# Patient Record
Sex: Female | Born: 1950 | Race: Black or African American | Hispanic: No | Marital: Single | Smoking: Never smoker
Health system: Southern US, Community
[De-identification: ages and names within clinical notes are randomized; demographics above are authoritative.]

## PROBLEM LIST (undated history)

## (undated) DIAGNOSIS — M1990 Unspecified osteoarthritis, unspecified site: Secondary | ICD-10-CM

## (undated) DIAGNOSIS — D649 Anemia, unspecified: Secondary | ICD-10-CM

## (undated) DIAGNOSIS — Z973 Presence of spectacles and contact lenses: Secondary | ICD-10-CM

## (undated) DIAGNOSIS — M109 Gout, unspecified: Secondary | ICD-10-CM

## (undated) DIAGNOSIS — J189 Pneumonia, unspecified organism: Secondary | ICD-10-CM

## (undated) HISTORY — DX: Unspecified osteoarthritis, unspecified site: M19.90

## (undated) HISTORY — PX: NO PAST SURGERIES: SHX2092

## (undated) HISTORY — DX: Anemia, unspecified: D64.9

## (undated) HISTORY — PX: COLONOSCOPY W/ POLYPECTOMY: SHX1380

---

## 2012-10-15 ENCOUNTER — Encounter: Payer: Self-pay | Admitting: Nurse Practitioner

## 2012-10-15 ENCOUNTER — Ambulatory Visit (INDEPENDENT_AMBULATORY_CARE_PROVIDER_SITE_OTHER): Payer: BC Managed Care – PPO | Admitting: Nurse Practitioner

## 2012-10-15 ENCOUNTER — Telehealth: Payer: Self-pay | Admitting: Nurse Practitioner

## 2012-10-15 VITALS — BP 130/78 | HR 76 | Temp 97.7°F | Ht 64.0 in | Wt 155.0 lb

## 2012-10-15 DIAGNOSIS — M25511 Pain in right shoulder: Secondary | ICD-10-CM

## 2012-10-15 DIAGNOSIS — M25569 Pain in unspecified knee: Secondary | ICD-10-CM

## 2012-10-15 DIAGNOSIS — M25519 Pain in unspecified shoulder: Secondary | ICD-10-CM

## 2012-10-15 DIAGNOSIS — M25561 Pain in right knee: Secondary | ICD-10-CM

## 2012-10-15 DIAGNOSIS — M25512 Pain in left shoulder: Secondary | ICD-10-CM

## 2012-10-15 MED ORDER — MELOXICAM 15 MG PO TABS: 15.0000 mg | ORAL_TABLET | Freq: Every day | ORAL | Status: DC

## 2012-10-15 NOTE — Progress Notes (Signed)
  Subjective:    Patient ID: Caitlyn Newman, female    DOB: 09/23/1950, 62 y.o.   MRN: [id number]  HPI1. Patient in c/o right knee pain- has been hurting for awile- has seen orthopedist and said cartilage has gone bad and needs surgery - planning to have later on in the year. 2. SHe is also C/O shoulders being sore- gets better throughout the day. Has to raise arms up a lot at work.   Review of Systems  All other systems reviewed and are negative.       Objective:   Physical Exam  Constitutional: She appears well-developed and well-nourished.  Cardiovascular: Normal rate and normal heart sounds.   Pulmonary/Chest: Effort normal and breath sounds normal.  Musculoskeletal:  Decrease ROM bil SHpulder due to "soreness" with abduction. Grips equal bil motor strength and sensation distally intact. Mild right knee effusion with crepitus on flexion and extension- no patella tenderness.  Neurological: She has normal reflexes.      BP 130/78  Pulse 76  Temp(Src) 97.7 F (36.5 C) (Oral)  Ht 5\' 4"  (1.626 m)  Wt 155 lb (70.308 kg)  BMI 26.59 kg/m2     Assessment & Plan:   1. Right knee pain   2. Shoulder pain, bilateral    Meds ordered this encounter  Medications  . meloxicam (MOBIC) 15 MG tablet    Sig: Take 1 tablet (15 mg total) by mouth daily.    Dispense:  30 tablet    Refill:  0    Order Specific Question:  Supervising Provider    Answer:  Ernestina Penna [[id number]]   Moist heat to achy joints Rest Keep follow up appointment with orthopedist RTO Prn  Mary-Margaret Daphine Deutscher, FNP

## 2012-10-15 NOTE — Patient Instructions (Signed)

## 2012-10-15 NOTE — Telephone Encounter (Signed)
appt given  

## 2012-10-16 LAB — COMPLETE METABOLIC PANEL WITH GFR
ALT: 9 U/L (ref 0–35)
AST: 10 U/L (ref 0–37)
Albumin: 3.6 g/dL (ref 3.5–5.2)
BUN: 12 mg/dL (ref 6–23)
Calcium: 8.9 mg/dL (ref 8.4–10.5)
Chloride: 102 mEq/L (ref 96–112)
Potassium: 4.2 mEq/L (ref 3.5–5.3)
Total Protein: 6.8 g/dL (ref 6.0–8.3)

## 2012-10-16 LAB — ARTHRITIS PANEL: Sed Rate: 61 mm/hr — ABNORMAL HIGH (ref 0–22)

## 2012-10-20 ENCOUNTER — Telehealth: Payer: Self-pay | Admitting: Nurse Practitioner

## 2012-10-21 ENCOUNTER — Telehealth: Payer: Self-pay | Admitting: Nurse Practitioner

## 2012-10-21 NOTE — Telephone Encounter (Signed)
Patient aware of labs.  

## 2012-11-07 ENCOUNTER — Telehealth: Payer: Self-pay | Admitting: Nurse Practitioner

## 2012-11-07 NOTE — Telephone Encounter (Signed)
FYI. Do you want to make any changes?

## 2012-11-10 NOTE — Telephone Encounter (Signed)
Patient notified on 11/10/2012 by Bennie Pierini.

## 2012-11-10 NOTE — Telephone Encounter (Signed)
Ok lets see how do without- that is nit a normal side effect of muscle relaxer

## 2012-12-26 ENCOUNTER — Telehealth: Payer: Self-pay | Admitting: Nurse Practitioner

## 2012-12-29 ENCOUNTER — Other Ambulatory Visit: Payer: Self-pay | Admitting: Nurse Practitioner

## 2012-12-29 NOTE — Telephone Encounter (Signed)
done

## 2013-01-05 ENCOUNTER — Encounter (INDEPENDENT_AMBULATORY_CARE_PROVIDER_SITE_OTHER): Payer: Self-pay | Admitting: Surgery

## 2013-01-05 ENCOUNTER — Ambulatory Visit (INDEPENDENT_AMBULATORY_CARE_PROVIDER_SITE_OTHER): Payer: BC Managed Care – PPO | Admitting: Surgery

## 2013-01-05 VITALS — BP 130/80 | HR 88 | Temp 98.2°F | Resp 14 | Ht 64.0 in | Wt 143.4 lb

## 2013-01-05 DIAGNOSIS — R599 Enlarged lymph nodes, unspecified: Secondary | ICD-10-CM

## 2013-01-05 DIAGNOSIS — R590 Localized enlarged lymph nodes: Secondary | ICD-10-CM | POA: Insufficient documentation

## 2013-01-05 NOTE — Progress Notes (Signed)
Patient ID: Caitlyn Newman, female   DOB: 15-Oct-1950, 62 y.o.   MRN: [id number]  Chief Complaint  Patient presents with  . New Evaluation    eval intra abd lymphadenopathy    HPI Caitlyn Newman is a 62 y.o. female.  Patient's at request of [facility] nurse practitioner for inguinal lymphadenopathy, fatigue and weight loss with anemia. Abdominal and pelvic CT scan was obtained which showed retroperitoneal, external iliac and inguinal lymphadenopathy. Largest measures 1.3 cm in the right inguinal region. Patient complains of fatigue, anemia weight loss and knee pain. HPI  Past Medical History  Diagnosis Date  . Anemia   . Arthritis     History reviewed. No pertinent past surgical history.  Family History  Problem Relation Age of Onset  . Diabetes Mother   . Kidney disease Mother   . Hypertension Mother   . Hypertension Father   . Diabetes Father     Social History History  Substance Use Topics  . Smoking status: Never Smoker   . Smokeless tobacco: Never Used  . Alcohol Use: No    Allergies  Allergen Reactions  . Cortisone     Other reaction(s): Rash  . Penicillins     Other reaction(s): Other Unknown    Current Outpatient Prescriptions  Medication Sig Dispense Refill  . ibuprofen (ADVIL,MOTRIN) 200 MG tablet 200 mg. Take 200 mg by mouth every 6 (six) hours as needed for Pain.      . meloxicam (MOBIC) 15 MG tablet TAKE 1 TABLET (15 MG TOTAL) BY MOUTH DAILY.  30 tablet  0   No current facility-administered medications for this visit.    Review of Systems Review of Systems  Constitutional: Positive for fatigue and unexpected weight change.  HENT: Negative.   Eyes: Negative.   Respiratory: Negative.   Cardiovascular: Negative.   Gastrointestinal: Negative.   Endocrine: Negative.   Genitourinary: Negative.   Musculoskeletal: Positive for arthralgias.  Skin: Negative.   Allergic/Immunologic: Negative.   Neurological: Negative.   Hematological:  Negative.   Psychiatric/Behavioral: Negative.     Blood pressure 130/80, pulse 88, temperature 98.2 F (36.8 C), temperature source Temporal, resp. rate 14, height 5\' 4"  (1.626 m), weight 143 lb 6.4 oz (65.046 kg).  Physical Exam Physical Exam  Constitutional: She is oriented to person, place, and time. She appears well-developed and well-nourished.  HENT:  Head: Normocephalic and atraumatic.  Eyes: EOM are normal. Pupils are equal, round, and reactive to light.  Pulmonary/Chest: Effort normal and breath sounds normal.  Abdominal: Soft. Bowel sounds are normal. She exhibits no distension. There is no tenderness.  Musculoskeletal:       Right knee: She exhibits decreased range of motion. Tenderness found.  Lymphadenopathy:    She has no cervical adenopathy.    She has no axillary adenopathy.       Right: Inguinal adenopathy present.       Left: Inguinal adenopathy present.  Neurological: She is alert and oriented to person, place, and time.  Skin: Skin is warm and dry.  Psychiatric: She has a normal mood and affect. Her behavior is normal. Judgment and thought content normal.    Data Reviewed [facility] notes  Assessment    Inguinal lymphadenopathy bilateral with extension along external iliac vessels to retroperitoneum by CT scan assessment  Anemia unspecified  fatigue    Plan    Recommend right inguinal lymph node biopsy to exclude lymphoma.The procedure has been discussed with the patient.  Alternative therapies have been  discussed with the patient.  Operative risks include bleeding,  Infection,  Organ injury,  Nerve injury,  Blood vessel injury,  DVT,  Pulmonary embolism,  Death,  And possible reoperation.  Medical management risks include worsening of present situation.  The success of the procedure is 50 -90 % at treating patients symptoms.  The patient understands and agrees to proceed.       Hanan Moen A. 01/05/2013, 3:44 PM

## 2013-01-05 NOTE — Patient Instructions (Signed)
Lymphadenopathy °Lymphadenopathy means "disease of the lymph glands." But the term is usually used to describe swollen or enlarged lymph glands, also called lymph nodes. These are the bean-shaped organs found in many locations including the neck, underarm, and groin. Lymph glands are part of the immune system, which fights infections in your body. Lymphadenopathy can occur in just one area of the body, such as the neck, or it can be generalized, with lymph node enlargement in several areas. The nodes found in the neck are the most common sites of lymphadenopathy. °CAUSES  °When your immune system responds to germs (such as viruses or bacteria ), infection-fighting cells and fluid build up. This causes the glands to grow in size. This is usually not something to worry about. Sometimes, the glands themselves can become infected and inflamed. This is called lymphadenitis. °Enlarged lymph nodes can be caused by many diseases: °· Bacterial disease, such as strep throat or a skin infection. °· Viral disease, such as a common cold. °· Other germs, such as lyme disease, tuberculosis, or sexually transmitted diseases. °· Cancers, such as lymphoma (cancer of the lymphatic system) or leukemia (cancer of the white blood cells). °· Inflammatory diseases such as lupus or rheumatoid arthritis. °· Reactions to medications. °Many of the diseases above are rare, but important. This is why you should see your caregiver if you have lymphadenopathy. °SYMPTOMS  °· Swollen, enlarged lumps in the neck, back of the head or other locations. °· Tenderness. °· Warmth or redness of the skin over the lymph nodes. °· Fever. °DIAGNOSIS  °Enlarged lymph nodes are often near the source of infection. They can help healthcare providers diagnose your illness. For instance:  °· Swollen lymph nodes around the jaw might be caused by an infection in the mouth. °· Enlarged glands in the neck often signal a throat infection. °· Lymph nodes that are swollen  in more than one area often indicate an illness caused by a virus. °Your caregiver most likely will know what is causing your lymphadenopathy after listening to your history and examining you. Blood tests, x-rays or other tests may be needed. If the cause of the enlarged lymph node cannot be found, and it does not go away by itself, then a biopsy may be needed. Your caregiver will discuss this with you. °TREATMENT  °Treatment for your enlarged lymph nodes will depend on the cause. Many times the nodes will shrink to normal size by themselves, with no treatment. Antibiotics or other medicines may be needed for infection. Only take over-the-counter or prescription medicines for pain, discomfort or fever as directed by your caregiver. °HOME CARE INSTRUCTIONS  °Swollen lymph glands usually return to normal when the underlying medical condition goes away. If they persist, contact your health-care provider. He/she might prescribe antibiotics or other treatments, depending on the diagnosis. Take any medications exactly as prescribed. Keep any follow-up appointments made to check on the condition of your enlarged nodes.  °SEEK MEDICAL CARE IF:  °· Swelling lasts for more than two weeks. °· You have symptoms such as weight loss, night sweats, fatigue or fever that does not go away. °· The lymph nodes are hard, seem fixed to the skin or are growing rapidly. °· Skin over the lymph nodes is red and inflamed. This could mean there is an infection. °SEEK IMMEDIATE MEDICAL CARE IF:  °· Fluid starts leaking from the area of the enlarged lymph node. °· You develop a fever of 102° F (38.9° C) or greater. °· Severe   pain develops (not necessarily at the site of a large lymph node). °· You develop chest pain or shortness of breath. °· You develop worsening abdominal pain. °MAKE SURE YOU:  °· Understand these instructions. °· Will watch your condition. °· Will get help right away if you are not doing well or get worse. °Document  Released: 01/03/2008 Document Revised: 06/18/2011 Document Reviewed: 01/03/2008 °[employer]® Patient Information ©2014 [employer]. ° °

## 2013-01-06 ENCOUNTER — Encounter (INDEPENDENT_AMBULATORY_CARE_PROVIDER_SITE_OTHER): Payer: Self-pay

## 2013-01-07 ENCOUNTER — Encounter (HOSPITAL_BASED_OUTPATIENT_CLINIC_OR_DEPARTMENT_OTHER): Payer: Self-pay | Admitting: *Deleted

## 2013-01-07 NOTE — Progress Notes (Signed)
Pt go to [facility] for labs and [facility]-orders faxed both places and pt to call for appt

## 2013-01-08 ENCOUNTER — Encounter (HOSPITAL_COMMUNITY)
Admission: RE | Admit: 2013-01-08 | Discharge: 2013-01-08 | Disposition: A | Discharge status: Home / Self Care | Payer: BC Managed Care – PPO | Source: Ambulatory Visit | Attending: Surgery | Admitting: Surgery

## 2013-01-08 ENCOUNTER — Ambulatory Visit (HOSPITAL_COMMUNITY)
Admission: RE | Admit: 2013-01-08 | Discharge: 2013-01-08 | Disposition: A | Discharge status: Home / Self Care | Payer: BC Managed Care – PPO | Source: Ambulatory Visit | Attending: Surgery | Admitting: Surgery

## 2013-01-08 DIAGNOSIS — R599 Enlarged lymph nodes, unspecified: Secondary | ICD-10-CM | POA: Insufficient documentation

## 2013-01-08 DIAGNOSIS — R634 Abnormal weight loss: Secondary | ICD-10-CM | POA: Insufficient documentation

## 2013-01-08 LAB — COMPREHENSIVE METABOLIC PANEL
ALT: 14 U/L (ref 0–35)
AST: 13 U/L (ref 0–37)
CO2: 29 mEq/L (ref 19–32)
Chloride: 98 mEq/L (ref 96–112)
Creatinine, Ser: 0.56 mg/dL (ref 0.50–1.10)
GFR calc Af Amer: >90 mL/min (ref >90)
GFR calc non Af Amer: >90 mL/min (ref >90)
Glucose, Bld: 191 mg/dL — ABNORMAL HIGH (ref 70–99)
Potassium: 3.9 mEq/L (ref 3.5–5.1)
Total Bilirubin: 0.3 mg/dL (ref 0.3–1.2)

## 2013-01-08 LAB — CBC WITH DIFFERENTIAL/PLATELET
Basophils Absolute: 0 10*3/uL (ref 0.0–0.1)
HCT: 33.4 % — ABNORMAL LOW (ref 36.0–46.0)
Hemoglobin: 10.8 g/dL — ABNORMAL LOW (ref 12.0–15.0)
Lymphocytes Relative: 14 % (ref 12–46)
Lymphs Abs: 1.6 10*3/uL (ref 0.7–4.0)
Monocytes Absolute: 0.6 10*3/uL (ref 0.1–1.0)
Monocytes Relative: 5 % (ref 3–12)
Neutro Abs: 9.4 10*3/uL — ABNORMAL HIGH (ref 1.7–7.7)
Neutrophils Relative %: 81 % — ABNORMAL HIGH (ref 43–77)
RBC: 3.96 MIL/uL (ref 3.87–5.11)
RDW: 13.7 % (ref 11.5–15.5)
WBC: 11.6 10*3/uL — ABNORMAL HIGH (ref 4.0–10.5)

## 2013-01-13 ENCOUNTER — Ambulatory Visit (HOSPITAL_BASED_OUTPATIENT_CLINIC_OR_DEPARTMENT_OTHER): Payer: BC Managed Care – PPO | Admitting: Certified Registered Nurse Anesthetist

## 2013-01-13 ENCOUNTER — Ambulatory Visit (HOSPITAL_BASED_OUTPATIENT_CLINIC_OR_DEPARTMENT_OTHER)
Admission: RE | Admit: 2013-01-13 | Discharge: 2013-01-13 | Disposition: A | Discharge status: Home / Self Care | Payer: BC Managed Care – PPO | Source: Ambulatory Visit | Attending: Surgery | Admitting: Surgery

## 2013-01-13 ENCOUNTER — Encounter (HOSPITAL_BASED_OUTPATIENT_CLINIC_OR_DEPARTMENT_OTHER)
Admission: RE | Disposition: A | Discharge status: Home / Self Care | Payer: Self-pay | Source: Ambulatory Visit | Attending: Surgery

## 2013-01-13 ENCOUNTER — Encounter (HOSPITAL_BASED_OUTPATIENT_CLINIC_OR_DEPARTMENT_OTHER): Payer: Self-pay | Admitting: Certified Registered Nurse Anesthetist

## 2013-01-13 DIAGNOSIS — M25569 Pain in unspecified knee: Secondary | ICD-10-CM | POA: Insufficient documentation

## 2013-01-13 DIAGNOSIS — R5381 Other malaise: Secondary | ICD-10-CM | POA: Insufficient documentation

## 2013-01-13 DIAGNOSIS — M129 Arthropathy, unspecified: Secondary | ICD-10-CM | POA: Insufficient documentation

## 2013-01-13 DIAGNOSIS — R634 Abnormal weight loss: Secondary | ICD-10-CM | POA: Insufficient documentation

## 2013-01-13 DIAGNOSIS — R599 Enlarged lymph nodes, unspecified: Secondary | ICD-10-CM

## 2013-01-13 DIAGNOSIS — R590 Localized enlarged lymph nodes: Secondary | ICD-10-CM

## 2013-01-13 DIAGNOSIS — Z79899 Other long term (current) drug therapy: Secondary | ICD-10-CM | POA: Insufficient documentation

## 2013-01-13 DIAGNOSIS — D649 Anemia, unspecified: Secondary | ICD-10-CM | POA: Insufficient documentation

## 2013-01-13 HISTORY — DX: Presence of spectacles and contact lenses: Z97.3

## 2013-01-13 HISTORY — PX: LYMPH NODE BIOPSY: SHX201

## 2013-01-13 SURGERY — LYMPH NODE BIOPSY
Anesthesia: General | Site: Groin | Laterality: Right | Wound class: Clean

## 2013-01-13 MED ORDER — CHLORHEXIDINE GLUCONATE 4 % EX LIQD: 1.0000 "application " | Freq: Once | CUTANEOUS | Status: DC

## 2013-01-13 MED ORDER — OXYCODONE HCL 5 MG PO TABS: 5.0000 mg | ORAL_TABLET | Freq: Once | ORAL | Status: DC | PRN

## 2013-01-13 MED ORDER — LIDOCAINE HCL (CARDIAC) 20 MG/ML IV SOLN
INTRAVENOUS | Status: DC | PRN
  Administered 2013-01-13: 60 mg via INTRAVENOUS

## 2013-01-13 MED ORDER — OXYCODONE HCL 5 MG/5ML PO SOLN: 5.0000 mg | Freq: Once | ORAL | Status: DC | PRN

## 2013-01-13 MED ORDER — MIDAZOLAM HCL 2 MG/2ML IJ SOLN: 1.0000 mg | INTRAMUSCULAR | Status: DC | PRN

## 2013-01-13 MED ORDER — MIDAZOLAM HCL 5 MG/5ML IJ SOLN
INTRAMUSCULAR | Status: DC | PRN
  Administered 2013-01-13: 1 mg via INTRAVENOUS

## 2013-01-13 MED ORDER — BUPIVACAINE-EPINEPHRINE 0.25% -1:200000 IJ SOLN
INTRAMUSCULAR | Status: DC | PRN
  Administered 2013-01-13: 10 mL

## 2013-01-13 MED ORDER — FENTANYL CITRATE 0.05 MG/ML IJ SOLN: 50.0000 ug | INTRAMUSCULAR | Status: DC | PRN

## 2013-01-13 MED ORDER — METOCLOPRAMIDE HCL 5 MG/ML IJ SOLN: 10.0000 mg | Freq: Once | INTRAMUSCULAR | Status: DC | PRN

## 2013-01-13 MED ORDER — HYDROCODONE-ACETAMINOPHEN 5-325 MG PO TABS: 1.0000 | ORAL_TABLET | Freq: Four times a day (QID) | ORAL | Status: DC | PRN

## 2013-01-13 MED ORDER — HYDROMORPHONE HCL PF 1 MG/ML IJ SOLN: 0.2500 mg | INTRAMUSCULAR | Status: DC | PRN

## 2013-01-13 MED ORDER — METOCLOPRAMIDE HCL 5 MG/ML IJ SOLN
INTRAMUSCULAR | Status: DC | PRN
  Administered 2013-01-13: 10 mg via INTRAVENOUS

## 2013-01-13 MED ORDER — PROPOFOL 10 MG/ML IV BOLUS
INTRAVENOUS | Status: DC | PRN
  Administered 2013-01-13: 150 mg via INTRAVENOUS

## 2013-01-13 MED ORDER — FENTANYL CITRATE 0.05 MG/ML IJ SOLN
INTRAMUSCULAR | Status: DC | PRN
  Administered 2013-01-13 (×3): 25 ug via INTRAVENOUS
  Administered 2013-01-13: 50 ug via INTRAVENOUS

## 2013-01-13 MED ORDER — VANCOMYCIN HCL IN DEXTROSE 1-5 GM/200ML-% IV SOLN
1000.0000 mg | INTRAVENOUS | Status: AC
  Administered 2013-01-13 (×2): 1000 mg via INTRAVENOUS

## 2013-01-13 MED ORDER — LACTATED RINGERS IV SOLN
INTRAVENOUS | Status: DC
  Administered 2013-01-13: 07:00:00 via INTRAVENOUS

## 2013-01-13 SURGICAL SUPPLY — 39 items
BENZOIN TINCTURE PRP APPL 2/3 (GAUZE/BANDAGES/DRESSINGS) ×2 IMPLANT
BLADE SURG 15 STRL LF DISP TIS (BLADE) ×1 IMPLANT
BLADE SURG 15 STRL SS (BLADE) ×1
CANISTER SUCTION 1200CC (MISCELLANEOUS) IMPLANT
CHLORAPREP W/TINT 26ML (MISCELLANEOUS) ×2 IMPLANT
CLIP TI WIDE RED SMALL 6 (CLIP) IMPLANT
CLOTH BEACON ORANGE TIMEOUT ST (SAFETY) ×2 IMPLANT
COVER MAYO STAND STRL (DRAPES) ×2 IMPLANT
COVER TABLE BACK 60X90 (DRAPES) ×2 IMPLANT
DECANTER SPIKE VIAL GLASS SM (MISCELLANEOUS) ×2 IMPLANT
DRAPE PED LAPAROTOMY (DRAPES) ×2 IMPLANT
DRAPE UTILITY XL STRL (DRAPES) ×2 IMPLANT
ELECT COATED BLADE 2.86 ST (ELECTRODE) ×2 IMPLANT
ELECT REM PT RETURN 9FT ADLT (ELECTROSURGICAL) ×2
ELECTRODE REM PT RTRN 9FT ADLT (ELECTROSURGICAL) ×1 IMPLANT
GLOVE BIOGEL PI IND STRL 7.0 (GLOVE) ×2 IMPLANT
GLOVE BIOGEL PI IND STRL 8 (GLOVE) ×1 IMPLANT
GLOVE BIOGEL PI INDICATOR 7.0 (GLOVE) ×2
GLOVE BIOGEL PI INDICATOR 8 (GLOVE) ×1
GLOVE ECLIPSE 6.5 STRL STRAW (GLOVE) ×2 IMPLANT
GLOVE ECLIPSE 8.0 STRL XLNG CF (GLOVE) ×2 IMPLANT
GOWN PREVENTION PLUS XLARGE (GOWN DISPOSABLE) ×2 IMPLANT
NEEDLE HYPO 25X1 1.5 SAFETY (NEEDLE) ×2 IMPLANT
NS IRRIG 1000ML POUR BTL (IV SOLUTION) ×2 IMPLANT
PACK BASIN DAY SURGERY FS (CUSTOM PROCEDURE TRAY) ×2 IMPLANT
PENCIL BUTTON HOLSTER BLD 10FT (ELECTRODE) ×2 IMPLANT
SLEEVE SCD COMPRESS KNEE MED (MISCELLANEOUS) IMPLANT
SPONGE LAP 4X18 X RAY DECT (DISPOSABLE) ×2 IMPLANT
STAPLER VISISTAT 35W (STAPLE) IMPLANT
STRIP CLOSURE SKIN 1/2X4 (GAUZE/BANDAGES/DRESSINGS) ×2 IMPLANT
SUT CHROMIC 3 0 SH 27 (SUTURE) IMPLANT
SUT MON AB 4-0 PC3 18 (SUTURE) ×2 IMPLANT
SUT VIC AB 3-0 SH 27 (SUTURE)
SUT VIC AB 3-0 SH 27X BRD (SUTURE) IMPLANT
SYR CONTROL 10ML LL (SYRINGE) ×2 IMPLANT
TOWEL OR 17X24 6PK STRL BLUE (TOWEL DISPOSABLE) ×4 IMPLANT
TOWEL OR NON WOVEN STRL DISP B (DISPOSABLE) ×2 IMPLANT
TUBE CONNECTING 20X1/4 (TUBING) ×2 IMPLANT
YANKAUER SUCT BULB TIP NO VENT (SUCTIONS) IMPLANT

## 2013-01-13 NOTE — Interval H&P Note (Signed)
History and Physical Interval Note:  01/13/2013 6:51 AM  Caitlyn Newman  has presented today for surgery, with the diagnosis of RIGHT GROIN PAIN  The various methods of treatment have been discussed with the patient and family. After consideration of risks, benefits and other options for treatment, the patient has consented to  Procedure(s): LYMPH NODE BIOPSY RIGHT GROIN (Right) as a surgical intervention .  The patient's history has been reviewed, patient examined, no change in status, stable for surgery.  I have reviewed the patient's chart and labs.  Questions were answered to the patient's satisfaction.     Amyra Vantuyl A.

## 2013-01-13 NOTE — Anesthesia Procedure Notes (Signed)
Procedure Name: LMA Insertion Date/Time: 01/13/2013 7:32 AM Performed by: Rudi Knippenberg D Pre-anesthesia Checklist: Patient identified, Emergency Drugs available, Suction available and Patient being monitored Patient Re-evaluated:Patient Re-evaluated prior to inductionOxygen Delivery Method: Circle System Utilized Preoxygenation: Pre-oxygenation with 100% oxygen Intubation Type: IV induction Ventilation: Mask ventilation without difficulty LMA: LMA inserted LMA Size: 4.0 Number of attempts: 1 Airway Equipment and Method: bite block Placement Confirmation: positive ETCO2 Tube secured with: Tape Dental Injury: Teeth and Oropharynx as per pre-operative assessment

## 2013-01-13 NOTE — Op Note (Signed)
Preoperative diagnosis: Right inguinal lymphadenopathy  Postoperative diagnosis: Same  Procedure: Right inguinal lymph node biopsy superficial  Surgeon: Harriette Bouillon M.D.  Anesthesia: Gen. With 0.25% Sensorcaine local with epinephrine  EBL: 20 cc  Specimen: Right inguinal lymph nodes to pathology  IV fluids: 300 cc crystalloid  Drains: None  Indications for procedure: The patient presents for right inguinal lymph node biopsy due to  to diffuse lymphadenopathy involving the inguinal, internal iliac and retroperitoneal lymphatic system. She is at weight loss and unexplained fevers for 2 months. Discussed the procedure at length with her. Risks, benefits and alternatives discussed. Complications discussed with the patient and are outlined in history and physical. She agreed to proceed.  Description of procedure: The patient was met in the holding area in the right groin was marked at the appropriate side. Questions are answered. She's taken back to the operating room placed upon the table. General anesthesia was initiated. Both groins were prepped and draped in a sterile fashion. Timeout was done and she received appropriate preoperative antibiotics. 2 cm incision was made in the right inguinal crease. Dissection was carried down to Scarpa's fascia to the aponeurosis of the external oblique. A 2 cm hard lymph node was identified and excised. Hemostasis achieved. Wound is closed with a deep layer of 3-0 Vicryl and 4-0 Monocryl was used to close the skin a subcuticular fashion. Dermabond applied. All final counts are found to be correct. Patient awoke, extubated and taken to recovery in satisfactory condition.

## 2013-01-13 NOTE — H&P (View-Only) (Signed)
Patient ID: Caitlyn Newman, female   DOB: 1950/06/21, 62 y.o.   MRN: [id number]  Chief Complaint  Patient presents with  . New Evaluation    eval intra abd lymphadenopathy    HPI Caitlyn Newman is a 62 y.o. female.  Patient's at request of [facility] nurse practitioner for inguinal lymphadenopathy, fatigue and weight loss with anemia. Abdominal and pelvic CT scan was obtained which showed retroperitoneal, external iliac and inguinal lymphadenopathy. Largest measures 1.3 cm in the right inguinal region. Patient complains of fatigue, anemia weight loss and knee pain. HPI  Past Medical History  Diagnosis Date  . Anemia   . Arthritis     History reviewed. No pertinent past surgical history.  Family History  Problem Relation Age of Onset  . Diabetes Mother   . Kidney disease Mother   . Hypertension Mother   . Hypertension Father   . Diabetes Father     Social History History  Substance Use Topics  . Smoking status: Never Smoker   . Smokeless tobacco: Never Used  . Alcohol Use: No    Allergies  Allergen Reactions  . Cortisone     Other reaction(s): Rash  . Penicillins     Other reaction(s): Other Unknown    Current Outpatient Prescriptions  Medication Sig Dispense Refill  . ibuprofen (ADVIL,MOTRIN) 200 MG tablet 200 mg. Take 200 mg by mouth every 6 (six) hours as needed for Pain.      . meloxicam (MOBIC) 15 MG tablet TAKE 1 TABLET (15 MG TOTAL) BY MOUTH DAILY.  30 tablet  0   No current facility-administered medications for this visit.    Review of Systems Review of Systems  Constitutional: Positive for fatigue and unexpected weight change.  HENT: Negative.   Eyes: Negative.   Respiratory: Negative.   Cardiovascular: Negative.   Gastrointestinal: Negative.   Endocrine: Negative.   Genitourinary: Negative.   Musculoskeletal: Positive for arthralgias.  Skin: Negative.   Allergic/Immunologic: Negative.   Neurological: Negative.   Hematological:  Negative.   Psychiatric/Behavioral: Negative.     Blood pressure 130/80, pulse 88, temperature 98.2 F (36.8 C), temperature source Temporal, resp. rate 14, height 5\' 4"  (1.626 m), weight 143 lb 6.4 oz (65.046 kg).  Physical Exam Physical Exam  Constitutional: She is oriented to person, place, and time. She appears well-developed and well-nourished.  HENT:  Head: Normocephalic and atraumatic.  Eyes: EOM are normal. Pupils are equal, round, and reactive to light.  Pulmonary/Chest: Effort normal and breath sounds normal.  Abdominal: Soft. Bowel sounds are normal. She exhibits no distension. There is no tenderness.  Musculoskeletal:       Right knee: She exhibits decreased range of motion. Tenderness found.  Lymphadenopathy:    She has no cervical adenopathy.    She has no axillary adenopathy.       Right: Inguinal adenopathy present.       Left: Inguinal adenopathy present.  Neurological: She is alert and oriented to person, place, and time.  Skin: Skin is warm and dry.  Psychiatric: She has a normal mood and affect. Her behavior is normal. Judgment and thought content normal.    Data Reviewed [facility] notes  Assessment    Inguinal lymphadenopathy bilateral with extension along external iliac vessels to retroperitoneum by CT scan assessment  Anemia unspecified  fatigue    Plan    Recommend right inguinal lymph node biopsy to exclude lymphoma.The procedure has been discussed with the patient.  Alternative therapies have been  discussed with the patient.  Operative risks include bleeding,  Infection,  Organ injury,  Nerve injury,  Blood vessel injury,  DVT,  Pulmonary embolism,  Death,  And possible reoperation.  Medical management risks include worsening of present situation.  The success of the procedure is 50 -90 % at treating patients symptoms.  The patient understands and agrees to proceed.       Tynslee Bowlds A. 01/05/2013, 3:44 PM

## 2013-01-13 NOTE — Anesthesia Preprocedure Evaluation (Addendum)
Anesthesia Evaluation  Patient identified by MRN, date of birth, ID band Patient awake    Reviewed: Allergy & Precautions, H&P , NPO status , Patient's Chart, lab work & pertinent test results, reviewed documented beta blocker date and time   Airway Mallampati: II TM Distance: >3 FB Neck ROM: full    Dental   Pulmonary neg pulmonary ROS,  breath sounds clear to auscultation        Cardiovascular negative cardio ROS  Rhythm:regular     Neuro/Psych negative neurological ROS  negative psych ROS   GI/Hepatic negative GI ROS, Neg liver ROS,   Endo/Other  negative endocrine ROS  Renal/GU negative Renal ROS  negative genitourinary   Musculoskeletal   Abdominal   Peds  Hematology negative hematology ROS (+) Blood dyscrasia, anemia ,   Anesthesia Other Findings See surgeon's H&P   Reproductive/Obstetrics negative OB ROS                           Anesthesia Physical Anesthesia Plan  ASA: II  Anesthesia Plan: General   Post-op Pain Management:    Induction: Intravenous  Airway Management Planned: LMA  Additional Equipment:   Intra-op Plan:   Post-operative Plan:   Informed Consent: I have reviewed the patients History and Physical, chart, labs and discussed the procedure including the risks, benefits and alternatives for the proposed anesthesia with the patient or authorized representative who has indicated his/her understanding and acceptance.   Dental Advisory Given  Plan Discussed with: CRNA and Surgeon  Anesthesia Plan Comments:         Anesthesia Quick Evaluation

## 2013-01-13 NOTE — Anesthesia Postprocedure Evaluation (Signed)
Anesthesia Post Note  Patient: Caitlyn Newman  Procedure(s) Performed: Procedure(s) (LRB): LYMPH NODE BIOPSY RIGHT GROIN (Right)  Anesthesia type: General  Patient location: PACU  Post pain: Pain level controlled  Post assessment: Patient's Cardiovascular Status Stable  Last Vitals:  Filed Vitals:   01/13/13 0900  BP: 128/66  Pulse: 81  Temp:   Resp: 20    Post vital signs: Reviewed and stable  Level of consciousness: alert  Complications: No apparent anesthesia complications

## 2013-01-13 NOTE — Transfer of Care (Signed)
Immediate Anesthesia Transfer of Care Note  Patient: Caitlyn Newman  Procedure(s) Performed: Procedure(s): LYMPH NODE BIOPSY RIGHT GROIN (Right)  Patient Location: PACU  Anesthesia Type:General  Level of Consciousness: awake and patient cooperative  Airway & Oxygen Therapy: Patient Spontanous Breathing and Patient connected to face mask oxygen  Post-op Assessment: Report given to PACU RN and Post -op Vital signs reviewed and stable  Post vital signs: Reviewed and stable  Complications: No apparent anesthesia complications

## 2013-01-14 ENCOUNTER — Telehealth (INDEPENDENT_AMBULATORY_CARE_PROVIDER_SITE_OTHER): Payer: Self-pay

## 2013-01-14 ENCOUNTER — Encounter (HOSPITAL_BASED_OUTPATIENT_CLINIC_OR_DEPARTMENT_OTHER): Payer: Self-pay | Admitting: Surgery

## 2013-01-14 NOTE — Telephone Encounter (Signed)
Pt called to let Dr. Luisa Hart know that her incision for yesterday's Bx has stopped bleeding.  Told pt I would send message to Dr. Luisa Hart

## 2013-01-16 ENCOUNTER — Telehealth (INDEPENDENT_AMBULATORY_CARE_PROVIDER_SITE_OTHER): Payer: Self-pay

## 2013-01-16 NOTE — Telephone Encounter (Signed)
The pt called in and asked if she can go out and about today.  She will not be the one driving.  I told her she can.

## 2013-01-20 ENCOUNTER — Telehealth (INDEPENDENT_AMBULATORY_CARE_PROVIDER_SITE_OTHER): Payer: Self-pay | Admitting: *Deleted

## 2013-01-20 NOTE — Telephone Encounter (Signed)
Patient called asking about her pathology results.  Patient also wanted to update Dr. Luisa Hart that she is improving and is starting to be able to do more for herself as far as dressing and getting around.  Explained that I would send a message to update him.

## 2013-01-20 NOTE — Telephone Encounter (Signed)
Called pt with below msg.  

## 2013-01-20 NOTE — Telephone Encounter (Signed)
No cancer benign.  Does not explain symptoms.  Will need to follow up with primary doctor.

## 2013-01-26 ENCOUNTER — Ambulatory Visit (INDEPENDENT_AMBULATORY_CARE_PROVIDER_SITE_OTHER): Payer: BC Managed Care – PPO | Admitting: Surgery

## 2013-01-26 ENCOUNTER — Encounter (INDEPENDENT_AMBULATORY_CARE_PROVIDER_SITE_OTHER): Payer: Self-pay | Admitting: Surgery

## 2013-01-26 VITALS — BP 126/74 | HR 84 | Temp 97.7°F | Resp 14 | Ht 64.0 in | Wt 141.6 lb

## 2013-01-26 DIAGNOSIS — Z9889 Other specified postprocedural states: Secondary | ICD-10-CM

## 2013-01-26 NOTE — Progress Notes (Signed)
Patient returns after right inguinal lymph node biopsy. This showed no signs of lymphoma or other etiology for her fatigue.  Exam: Right groin incision with some residual swelling. No signs of infection.  Impression: Status post right inguinal lymph node biopsy for fatigue and lymphadenopathy with benign findings  Plan: Contact primary care doctor for followup. Return as needed. Resume regular activity.  Lymph node for lymphoma, Right inguinal - BENIGN LYMPHOID TISSUE, SEE COMMENT.

## 2013-01-26 NOTE — Patient Instructions (Signed)
Call primary doctor as soon as possible to follow up.  Return as needed.  Resume regular activity.

## 2013-01-29 ENCOUNTER — Telehealth (INDEPENDENT_AMBULATORY_CARE_PROVIDER_SITE_OTHER): Payer: Self-pay

## 2013-01-29 NOTE — Telephone Encounter (Signed)
Pt calling in b/c she stated that she can not return back to work at the beginning of November. The pt states that her right hand is swollen and she can't work without her hand. I read pt's last office note of Dr Cornett's and it doesn't say anything about her hand being swollen. I did tell pt that she probably needs to call her primary care physician about her hand if it's swelling b/c Dr Luisa Hart just did a lymph node bx in the groin not near her hand. The pt still stated that she needs a note to be out of work. I explained to her that she would not get a note from [place] about not returning to work that she would need to see her primary care doctor to discuss being out of work longer than what we have given her for the lymph node bx. The pt will call her primary care physician now.

## 2013-01-30 NOTE — Telephone Encounter (Signed)
PT PHONED TODAY/STATED HER PCP RELAYED TO HER THAT DR CORNETT WOULD NEED TO EXTEND HER RTW DATE.  DUE TO PREV 01-29-13 NOTES FOR HAND SWELLING, UNABLE TO GIVE EXTENDED DATE DUE TO NOT BEING RELATED TO PREV GROIN SX. PT WILL CALL PCP FOR HAND PROBLEM.  GM

## 2013-01-30 NOTE — Telephone Encounter (Signed)
Ok to extend

## 2013-02-11 ENCOUNTER — Other Ambulatory Visit: Payer: Self-pay | Admitting: Nurse Practitioner

## 2013-02-12 ENCOUNTER — Other Ambulatory Visit: Payer: Self-pay | Admitting: Nurse Practitioner

## 2013-02-24 ENCOUNTER — Encounter (INDEPENDENT_AMBULATORY_CARE_PROVIDER_SITE_OTHER): Payer: Self-pay

## 2013-06-04 ENCOUNTER — Other Ambulatory Visit: Payer: Self-pay | Admitting: Nurse Practitioner

## 2013-06-05 NOTE — Telephone Encounter (Signed)
No  More refills without being seen

## 2013-06-05 NOTE — Telephone Encounter (Signed)
Notified at last refill that NTBS before next refill. Please advise

## 2013-08-03 ENCOUNTER — Inpatient Hospital Stay: Admit: 2013-08-03 | Payer: BC Managed Care – PPO | Admitting: Orthopedic Surgery

## 2013-08-03 SURGERY — ARTHROPLASTY, KNEE, TOTAL
Anesthesia: Spinal | Site: Knee | Laterality: Right

## 2013-10-28 ENCOUNTER — Ambulatory Visit (INDEPENDENT_AMBULATORY_CARE_PROVIDER_SITE_OTHER): Payer: 59

## 2013-10-28 ENCOUNTER — Encounter: Payer: Self-pay | Admitting: Nurse Practitioner

## 2013-10-28 ENCOUNTER — Ambulatory Visit (INDEPENDENT_AMBULATORY_CARE_PROVIDER_SITE_OTHER): Payer: 59 | Admitting: Nurse Practitioner

## 2013-10-28 VITALS — BP 124/56 | HR 64 | Temp 97.5°F | Ht 64.0 in | Wt 153.0 lb

## 2013-10-28 DIAGNOSIS — M129 Arthropathy, unspecified: Secondary | ICD-10-CM

## 2013-10-28 DIAGNOSIS — M1990 Unspecified osteoarthritis, unspecified site: Secondary | ICD-10-CM

## 2013-10-28 DIAGNOSIS — Z01818 Encounter for other preprocedural examination: Secondary | ICD-10-CM

## 2013-10-28 NOTE — Patient Instructions (Signed)
Tinnitus °Sounds you hear in your ears and coming from within the ear is called tinnitus. This can be a symptom of many ear disorders. It is often associated with hearing loss.  °Tinnitus can be seen with: °· Infections. °· Ear blockages such as wax buildup. °· Meniere's disease. °· Ear damage. °· Inherited. °· Occupational causes. °While irritating, it is not usually a threat to health. When the cause of the tinnitus is wax, infection in the middle ear, or foreign body it is easily treated. Hearing loss will usually be reversible.  °TREATMENT  °When treating the underlying cause does not get rid of tinnitus, it may be necessary to get rid of the unwanted sound by covering it up with more pleasant background noises. This may include music, the radio etc. There are tinnitus maskers which can be worn which produce background noise to cover up the tinnitus. °Avoid all medications which tend to make tinnitus worse such as alcohol, caffeine, aspirin, and nicotine. There are many soothing background tapes such as rain, ocean, thunderstorms, etc. These soothing sounds help with sleeping or resting. °Keep all follow-up appointments and referrals. This is important to identify the cause of the problem. It also helps avoid complications, impaired hearing, disability, or chronic pain. °Document Released: 03/26/2005 Document Revised: 06/18/2011 Document Reviewed: 11/12/2007 °ExitCare® Patient Information ©2015 [employer]. This information is not intended to replace advice given to you by your health care provider. Make sure you discuss any questions you have with your health care provider. ° °

## 2013-10-28 NOTE — Progress Notes (Signed)
   Subjective:    Patient ID: Caitlyn Newman, female    DOB: 1951-02-20, 63 y.o.   MRN: [id number]  HPI Patient in c/o ringing in her left ear- started last week- she says that the ringing is constant.    Review of Systems  Constitutional: Negative for fever and appetite change.  HENT: Negative.   Respiratory: Negative.   Cardiovascular: Negative.   Gastrointestinal: Negative.   Genitourinary: Negative.   Neurological: Negative.   Psychiatric/Behavioral: Negative.   All other systems reviewed and are negative.      Objective:   Physical Exam  Constitutional: She is oriented to person, place, and time. She appears well-developed and well-nourished.  HENT:  Bilateral cerumen impaction   Cardiovascular: Normal rate and normal heart sounds.   Pulmonary/Chest: Effort normal and breath sounds normal.  Abdominal: Soft. Bowel sounds are normal.  Neurological: She is alert and oriented to person, place, and time.  Skin: Skin is warm and dry.  Psychiatric: She has a normal mood and affect. Her behavior is normal. Judgment and thought content normal.   BP 124/56  Pulse 64  Temp(Src) 97.5 F (36.4 C) (Oral)  Ht 5\' 4"  (1.626 m)  Wt 153 lb (69.4 kg)  BMI 26.25 kg/m2  Chest x ray within normal limits-Preliminary reading by , FNP  [facility] EKG- sinus bradycardia-[facility], FNP       Assessment & Plan:  1. Preoperative clearance Surgical clearance granted pending anemia panel - EKG 12-Lead - DG Chest 2 View; Future - Anemia Profile B  2. Arthritis - Ambulatory referral to Rheumatology  3. Cerumen Impaction - debrox OTC 2-3 X a week - Do not stick anything in ear canals   Mary-Margaret Daphine Deutscher, FNP

## 2013-10-29 ENCOUNTER — Telehealth: Payer: Self-pay | Admitting: Nurse Practitioner

## 2013-11-11 ENCOUNTER — Encounter (HOSPITAL_COMMUNITY): Payer: Self-pay | Admitting: Pharmacy Technician

## 2013-11-11 NOTE — H&P (Signed)
TOTAL KNEE ADMISSION H&P  Patient is being admitted for right total knee arthroplasty.  Subjective:  Chief Complaint:   Right knee RA / pain  HPI: Caitlyn Newman, 63 y.o. female, has a history of pain and functional disability in the right knee due to arthritis and has failed non-surgical conservative treatments for greater than 12 weeks to include NSAID's and/or analgesics, corticosteriod injections, use of assistive devices and activity modification.  Onset of symptoms was gradual, starting 2+ years ago with gradually worsening course since that time. The patient noted no past surgery on the right knee(s).  Patient currently rates pain in the right knee(s) at 8 out of 10 with activity. Patient has night pain, worsening of pain with activity and weight bearing, pain that interferes with activities of daily living, pain with passive range of motion, crepitus and joint swelling.  Patient has evidence of periarticular osteophytes and joint space narrowing by imaging studies.  There is no active infection.  Risks, benefits and expectations were discussed with the patient.  Risks including but not limited to the risk of anesthesia, blood clots, nerve damage, blood vessel damage, failure of the prosthesis, infection and up to and including death.  Patient understand the risks, benefits and expectations and wishes to proceed with surgery.   PCP: Rudi Heap, MD  D/C Plans:      Home with HHPT  Post-op Meds:       No Rx given  Tranexamic Acid:      To be given - IV    Decadron:      Is to be given  FYI:     ASA post-op  Norco post-op    Patient Active Problem List   Diagnosis Date Noted  . Post-operative state 01/26/2013  . Lymphadenopathy, inguinal 01/05/2013   Past Medical History  Diagnosis Date  . Anemia   . Arthritis   . Wears glasses     Past Surgical History  Procedure Laterality Date  . No past surgeries    . Lymph node biopsy Right 01/13/2013    Procedure: LYMPH NODE  BIOPSY RIGHT GROIN;  Surgeon: Clovis Pu. Cornett, MD;  Location: [facility];  Service: General;  Laterality: Right;    No prescriptions prior to admission   Allergies  Allergen Reactions  . Cortisone     Other reaction(s): Rash  . Penicillins     Other reaction(s): Other Unknown    History  Substance Use Topics  . Smoking status: Never Smoker   . Smokeless tobacco: Never Used  . Alcohol Use: No    Family History  Problem Relation Age of Onset  . Diabetes Mother   . Kidney disease Mother   . Hypertension Mother   . Hypertension Father   . Diabetes Father      Review of Systems  Constitutional: Negative.   HENT: Negative.   Eyes: Negative.   Respiratory: Negative.   Cardiovascular: Negative.   Gastrointestinal: Negative.   Genitourinary: Negative.   Musculoskeletal: Positive for joint pain and myalgias.  Skin: Negative.   Neurological: Negative.   Endo/Heme/Allergies: Negative.   Psychiatric/Behavioral: Negative.     Objective:  Physical Exam  Constitutional: She is oriented to person, place, and time. She appears well-developed and well-nourished.  HENT:  Head: Normocephalic and atraumatic.  Mouth/Throat: Oropharynx is clear and moist.  Eyes: Pupils are equal, round, and reactive to light.  Neck: Neck supple. No JVD present. No tracheal deviation present. No thyromegaly present.  Cardiovascular: Normal  rate, regular rhythm, normal heart sounds and intact distal pulses.   Respiratory: Effort normal and breath sounds normal. No stridor. No respiratory distress. She has no wheezes.  GI: Soft. There is no tenderness. There is no guarding.  Musculoskeletal:       Right knee: She exhibits decreased range of motion, swelling and bony tenderness. She exhibits no ecchymosis, no deformity, no laceration and no erythema. Tenderness found.  Lymphadenopathy:    She has no cervical adenopathy.  Neurological: She is alert and oriented to person, place, and  time.  Skin: Skin is warm and dry.  Psychiatric: She has a normal mood and affect.     Labs: Estimated body mass index is 24.29 kg/(m^2) as calculated from the following:   Height as of 01/26/13: 5\' 4"  (1.626 m).   Weight as of 01/26/13: 64.229 kg (141 lb 9.6 oz).   Imaging Review Plain radiographs demonstrate severe degenerative joint disease of the right knee(s). The overall alignment is neutral. The bone quality appears to be good for age and reported activity level.  Assessment/Plan:  End stage arthritis, right knee   The patient history, physical examination, clinical judgment of the provider and imaging studies are consistent with end stage degenerative joint disease of the right knee(s) and total knee arthroplasty is deemed medically necessary. The treatment options including medical management, injection therapy arthroscopy and arthroplasty were discussed at length. The risks and benefits of total knee arthroplasty were presented and reviewed. The risks due to aseptic loosening, infection, stiffness, patella tracking problems, thromboembolic complications and other imponderables were discussed. The patient acknowledged the explanation, agreed to proceed with the plan and consent was signed. Patient is being admitted for inpatient treatment for surgery, pain control, PT, OT, prophylactic antibiotics, VTE prophylaxis, progressive ambulation and ADL's and discharge planning. The patient is planning to be discharged home with home health services.     01/28/13 [place]   PA-C  11/11/2013, 3:51 PM

## 2013-11-16 ENCOUNTER — Other Ambulatory Visit (HOSPITAL_COMMUNITY): Payer: Self-pay | Admitting: Orthopedic Surgery

## 2013-11-16 NOTE — Progress Notes (Signed)
Chest x ray, ekg 7/15 [facility]. Clearance with note Sheron Nightingale NP [facility]

## 2013-11-16 NOTE — Patient Instructions (Addendum)
Your procedure is scheduled on:  11/24/13  TUESDAY  Report to [facility]-- MAIN ENTRANCE- FOLLOW SIGNS TO SHORT STAY CENTER Short Stay Center at     11:20  AM.   Call this number if you have problems the morning of surgery: [phone]        Do not eat :After Midnight. Monday NIGHT-- MAY HAVE CLEAR LIQUIDS Tuesday MORNING UNTIL 0820 AM-- THEN NOTHING   Take these medicines the morning of surgery with A SIP OF WATER:NONE   .  Contacts, dentures or partial plates, or metal hairpins  can not be worn to surgery. Your family will be responsible for glasses, dentures, hearing aides while you are in surgery  Leave suitcase in the car. After surgery it may be brought to your room.  For patients admitted to the hospital, checkout time is 11:00 AM day of  discharge.         [facility] IS    NOT RESPONSIBLE FOR VALUABLES                                                                                                                                      [facility] - Preparing for Surgery Before surgery, you can play an important role.  Because skin is not sterile, your skin needs to be as free of germs as possible.  You can reduce the number of germs on your skin by washing with CHG (chlorahexidine gluconate) soap before surgery.  CHG is an antiseptic cleaner which kills germs and bonds with the skin to continue killing germs even after washing. Please DO NOT use if you have an allergy to CHG or antibacterial soaps.  If your skin becomes reddened/irritated stop using the CHG and inform your nurse when you arrive at Short Stay. Do not shave (including legs and underarms) for at least 48 hours prior to the first CHG shower.  You may shave your face/neck. Please follow these instructions carefully:  1.  Shower with CHG Soap the night before surgery and the  morning of Surgery.  2.  If you choose to wash your hair, wash your hair first as usual with your  normal  shampoo.  3.  After you  shampoo, rinse your hair and body thoroughly to remove the  shampoo.                           4.  Use CHG as you would any other liquid soap.  You can apply chg directly  to the skin and wash                       Gently with a scrungie or clean washcloth.  5.  Apply the CHG Soap to your body ONLY FROM THE NECK DOWN.   Do not use on face/ open  Wound or open sores. Avoid contact with eyes, ears mouth and genitals (private parts).                       Wash face,  Genitals (private parts) with your normal soap.             6.  Wash thoroughly, paying special attention to the area where your surgery  will be performed.  7.  Thoroughly rinse your body with warm water from the neck down.  8.  DO NOT shower/wash with your normal soap after using and rinsing off  the CHG Soap.                9.  Pat yourself dry with a clean towel.            10.  Wear clean pajamas.            11.  Place clean sheets on your bed the night of your first shower and do not  sleep with pets. Day of Surgery : Do not apply any lotions/deodorants the morning of surgery.  Please wear clean clothes to the hospital/surgery center.  FAILURE TO FOLLOW THESE INSTRUCTIONS MAY RESULT IN THE CANCELLATION OF YOUR SURGERY PATIENT SIGNATURE_________________________________  NURSE SIGNATURE__________________________________  ________________________________________________________________________    CLEAR LIQUID DIET   Foods Allowed                                                                     Foods Excluded  Coffee and tea, regular and decaf                             liquids that you cannot  Plain Jell-O in any flavor                                             see through such as: Fruit ices (not with fruit pulp)                                     milk, soups, orange juice  Iced Popsicles                                    All solid food Carbonated beverages, regular and diet                                     Cranberry, grape and apple juices Sports drinks like Gatorade Lightly seasoned clear broth or consume(fat free) Sugar, honey syrup  Sample Menu Breakfast                                Lunch  Supper Cranberry juice                    Beef broth                            Chicken broth Jell-O                                     Grape juice                           Apple juice Coffee or tea                        Jell-O                                      Popsicle                                                Coffee or tea                        Coffee or tea  _____________________________________________________________________    Incentive Spirometer  An incentive spirometer is a tool that can help keep your lungs clear and active. This tool measures how well you are filling your lungs with each breath. Taking long deep breaths may help reverse or decrease the chance of developing breathing (pulmonary) problems (especially infection) following:  A long period of time when you are unable to move or be active. BEFORE THE PROCEDURE   If the spirometer includes an indicator to show your best effort, your nurse or respiratory therapist will set it to a desired goal.  If possible, sit up straight or lean slightly forward. Try not to slouch.  Hold the incentive spirometer in an upright position. INSTRUCTIONS FOR USE  1. Sit on the edge of your bed if possible, or sit up as far as you can in bed or on a chair. 2. Hold the incentive spirometer in an upright position. 3. Breathe out normally. 4. Place the mouthpiece in your mouth and seal your lips tightly around it. 5. Breathe in slowly and as deeply as possible, raising the piston or the ball toward the top of the column. 6. Hold your breath for 3-5 seconds or for as long as possible. Allow the piston or ball to fall to the bottom of the column. 7. Remove the mouthpiece from your  mouth and breathe out normally. 8. Rest for a few seconds and repeat Steps 1 through 7 at least 10 times every 1-2 hours when you are awake. Take your time and take a few normal breaths between deep breaths. 9. The spirometer may include an indicator to show your best effort. Use the indicator as a goal to work toward during each repetition. 10. After each set of 10 deep breaths, practice coughing to be sure your lungs are clear. If you have an incision (the cut made at the time of surgery), support your incision when coughing by placing a pillow or rolled up towels firmly against  it. Once you are able to get out of bed, walk around indoors and cough well. You may stop using the incentive spirometer when instructed by your caregiver.  RISKS AND COMPLICATIONS  Take your time so you do not get dizzy or light-headed.  If you are in pain, you may need to take or ask for pain medication before doing incentive spirometry. It is harder to take a deep breath if you are having pain. AFTER USE  Rest and breathe slowly and easily.  It can be helpful to keep track of a log of your progress. Your caregiver can provide you with a simple table to help with this. If you are using the spirometer at home, follow these instructions: SEEK MEDICAL CARE IF:   You are having difficultly using the spirometer.  You have trouble using the spirometer as often as instructed.  Your pain medication is not giving enough relief while using the spirometer.  You develop fever of 100.5 F (38.1 C) or higher. SEEK IMMEDIATE MEDICAL CARE IF:   You cough up bloody sputum that had not been present before.  You develop fever of 102 F (38.9 C) or greater.  You develop worsening pain at or near the incision site. MAKE SURE YOU:   Understand these instructions.  Will watch your condition.  Will get help right away if you are not doing well or get worse. Document Released: 08/06/2006 Document Revised: 06/18/2011  Document Reviewed: 10/07/2006 ExitCare Patient Information 2014 Exit[employer], [place].   ________________________________________________________________________  WHAT IS A BLOOD TRANSFUSION? Blood Transfusion Information  A transfusion is the replacement of blood or some of its parts. Blood is made up of multiple cells which provide different functions.  Red blood cells carry oxygen and are used for blood loss replacement.  White blood cells fight against infection.  Platelets control bleeding.  Plasma helps clot blood.  Other blood products are available for specialized needs, such as hemophilia or other clotting disorders. BEFORE THE TRANSFUSION  Who gives blood for transfusions?   Healthy volunteers who are fully evaluated to make sure their blood is safe. This is blood bank blood. Transfusion therapy is the safest it has ever been in the practice of medicine. Before blood is taken from a donor, a complete history is taken to make sure that person has no history of diseases nor engages in risky social behavior (examples are intravenous drug use or sexual activity with multiple partners). The donor's travel history is screened to minimize risk of transmitting infections, such as malaria. The donated blood is tested for signs of infectious diseases, such as HIV and hepatitis. The blood is then tested to be sure it is compatible with you in order to minimize the chance of a transfusion reaction. If you or a relative donates blood, this is often done in anticipation of surgery and is not appropriate for emergency situations. It takes many days to process the donated blood. RISKS AND COMPLICATIONS Although transfusion therapy is very safe and saves many lives, the main dangers of transfusion include:   Getting an infectious disease.  Developing a transfusion reaction. This is an allergic reaction to something in the blood you were given. Every precaution is taken to prevent this. The decision  to have a blood transfusion has been considered carefully by your caregiver before blood is given. Blood is not given unless the benefits outweigh the risks. AFTER THE TRANSFUSION  Right after receiving a blood transfusion, you will usually feel much better and more  energetic. This is especially true if your red blood cells have gotten low (anemic). The transfusion raises the level of the red blood cells which carry oxygen, and this usually causes an energy increase.  The nurse administering the transfusion will monitor you carefully for complications. HOME CARE INSTRUCTIONS  No special instructions are needed after a transfusion. You may find your energy is better. Speak with your caregiver about any limitations on activity for underlying diseases you may have. SEEK MEDICAL CARE IF:   Your condition is not improving after your transfusion.  You develop redness or irritation at the intravenous (IV) site. SEEK IMMEDIATE MEDICAL CARE IF:  Any of the following symptoms occur over the next 12 hours:  Shaking chills.  You have a temperature by mouth above 102 F (38.9 C), not controlled by medicine.  Chest, back, or muscle pain.  People around you feel you are not acting correctly or are confused.  Shortness of breath or difficulty breathing.  Dizziness and fainting.  You get a rash or develop hives.  You have a decrease in urine output.  Your urine turns a dark color or changes to pink, red, or brown. Any of the following symptoms occur over the next 10 days:  You have a temperature by mouth above 102 F (38.9 C), not controlled by medicine.  Shortness of breath.  Weakness after normal activity.  The white part of the eye turns yellow (jaundice).  You have a decrease in the amount of urine or are urinating less often.  Your urine turns a dark color or changes to pink, red, or brown. Document Released: 03/23/2000 Document Revised: 06/18/2011 Document Reviewed:  11/10/2007 [facility] HospitalExitCare Patient Information 2014 [place], [place].  _______________________________________________________________________

## 2013-11-17 ENCOUNTER — Encounter (HOSPITAL_COMMUNITY)
Admission: RE | Admit: 2013-11-17 | Discharge: 2013-11-17 | Disposition: A | Discharge status: Home / Self Care | Payer: 59 | Source: Ambulatory Visit | Attending: Orthopedic Surgery | Admitting: Orthopedic Surgery

## 2013-11-17 ENCOUNTER — Encounter (HOSPITAL_COMMUNITY): Payer: Self-pay

## 2013-11-17 DIAGNOSIS — Z01812 Encounter for preprocedural laboratory examination: Secondary | ICD-10-CM | POA: Insufficient documentation

## 2013-11-17 HISTORY — DX: Pneumonia, unspecified organism: J18.9

## 2013-11-17 HISTORY — DX: Gout, unspecified: M10.9

## 2013-11-17 LAB — URINALYSIS, ROUTINE W REFLEX MICROSCOPIC
BILIRUBIN URINE: NEGATIVE
GLUCOSE, UA: NEGATIVE mg/dL
HGB URINE DIPSTICK: NEGATIVE
Ketones, ur: NEGATIVE mg/dL
Nitrite: NEGATIVE
Protein, ur: NEGATIVE mg/dL
SPECIFIC GRAVITY, URINE: 1.01 (ref 1.005–1.030)
Urobilinogen, UA: 0.2 mg/dL (ref 0.0–1.0)
pH: 7 (ref 5.0–8.0)

## 2013-11-17 LAB — BASIC METABOLIC PANEL
Anion gap: 10 (ref 5–15)
BUN: 12 mg/dL (ref 6–23)
CO2: 31 mEq/L (ref 19–32)
Calcium: 9.5 mg/dL (ref 8.4–10.5)
Chloride: 101 mEq/L (ref 96–112)
Creatinine, Ser: 0.6 mg/dL (ref 0.50–1.10)
GFR calc Af Amer: >90 mL/min (ref >90)
Glucose, Bld: 93 mg/dL (ref 70–99)
POTASSIUM: 4.7 meq/L (ref 3.7–5.3)
SODIUM: 142 meq/L (ref 137–147)

## 2013-11-17 LAB — PROTIME-INR
INR: 1.02 (ref 0.00–1.49)
Prothrombin Time: 13.4 seconds (ref 11.6–15.2)

## 2013-11-17 LAB — APTT: aPTT: 42 seconds — ABNORMAL HIGH (ref 24–37)

## 2013-11-17 LAB — URINE MICROSCOPIC-ADD ON

## 2013-11-17 LAB — CBC
HCT: 34.3 % — ABNORMAL LOW (ref 36.0–46.0)
Hemoglobin: 11.2 g/dL — ABNORMAL LOW (ref 12.0–15.0)
MCH: 28.8 pg (ref 26.0–34.0)
MCHC: 32.7 g/dL (ref 30.0–36.0)
MCV: 88.2 fL (ref 78.0–100.0)
PLATELETS: 279 10*3/uL (ref 150–400)
RBC: 3.89 MIL/uL (ref 3.87–5.11)
RDW: 15.1 % (ref 11.5–15.5)
WBC: 8.9 10*3/uL (ref 4.0–10.5)

## 2013-11-17 LAB — ABO/RH: ABO/RH(D): O POS

## 2013-11-17 LAB — SURGICAL PCR SCREEN
MRSA, PCR: NEGATIVE
STAPHYLOCOCCUS AUREUS: NEGATIVE

## 2013-11-17 NOTE — Progress Notes (Signed)
MICRO UA RESULTS FAXED TO DR Charlann Boxer BY EPIC

## 2013-11-24 ENCOUNTER — Inpatient Hospital Stay (HOSPITAL_COMMUNITY)
Admission: RE | Admit: 2013-11-24 | Discharge: 2013-11-27 | DRG: 470 | Disposition: A | Discharge status: Home Health | Payer: 59 | Source: Ambulatory Visit | Attending: Orthopedic Surgery | Admitting: Orthopedic Surgery

## 2013-11-24 ENCOUNTER — Inpatient Hospital Stay (HOSPITAL_COMMUNITY): Payer: 59 | Admitting: Anesthesiology

## 2013-11-24 ENCOUNTER — Encounter (HOSPITAL_COMMUNITY)
Admission: RE | Disposition: A | Discharge status: Home Health | Payer: Self-pay | Source: Ambulatory Visit | Attending: Orthopedic Surgery

## 2013-11-24 ENCOUNTER — Encounter (HOSPITAL_COMMUNITY): Payer: 59 | Admitting: Anesthesiology

## 2013-11-24 ENCOUNTER — Encounter (HOSPITAL_COMMUNITY): Payer: Self-pay

## 2013-11-24 DIAGNOSIS — M1710 Unilateral primary osteoarthritis, unspecified knee: Principal | ICD-10-CM | POA: Diagnosis present

## 2013-11-24 DIAGNOSIS — Z96659 Presence of unspecified artificial knee joint: Secondary | ICD-10-CM

## 2013-11-24 DIAGNOSIS — M898X9 Other specified disorders of bone, unspecified site: Secondary | ICD-10-CM | POA: Diagnosis present

## 2013-11-24 DIAGNOSIS — M25469 Effusion, unspecified knee: Secondary | ICD-10-CM | POA: Diagnosis present

## 2013-11-24 DIAGNOSIS — Z8249 Family history of ischemic heart disease and other diseases of the circulatory system: Secondary | ICD-10-CM | POA: Diagnosis not present

## 2013-11-24 DIAGNOSIS — Z01812 Encounter for preprocedural laboratory examination: Secondary | ICD-10-CM

## 2013-11-24 DIAGNOSIS — M6580 Other synovitis and tenosynovitis, unspecified site: Secondary | ICD-10-CM | POA: Diagnosis present

## 2013-11-24 DIAGNOSIS — Z833 Family history of diabetes mellitus: Secondary | ICD-10-CM | POA: Diagnosis not present

## 2013-11-24 DIAGNOSIS — Z6827 Body mass index (BMI) 27.0-27.9, adult: Secondary | ICD-10-CM | POA: Diagnosis not present

## 2013-11-24 DIAGNOSIS — M25569 Pain in unspecified knee: Secondary | ICD-10-CM | POA: Diagnosis present

## 2013-11-24 DIAGNOSIS — E663 Overweight: Secondary | ICD-10-CM | POA: Diagnosis present

## 2013-11-24 DIAGNOSIS — Z880 Allergy status to penicillin: Secondary | ICD-10-CM

## 2013-11-24 DIAGNOSIS — Z888 Allergy status to other drugs, medicaments and biological substances status: Secondary | ICD-10-CM | POA: Diagnosis not present

## 2013-11-24 DIAGNOSIS — D62 Acute posthemorrhagic anemia: Secondary | ICD-10-CM | POA: Diagnosis not present

## 2013-11-24 DIAGNOSIS — Z96651 Presence of right artificial knee joint: Secondary | ICD-10-CM

## 2013-11-24 HISTORY — PX: TOTAL KNEE ARTHROPLASTY: SHX125

## 2013-11-24 LAB — TYPE AND SCREEN
ABO/RH(D): O POS
Antibody Screen: NEGATIVE

## 2013-11-24 SURGERY — ARTHROPLASTY, KNEE, TOTAL
Anesthesia: Spinal | Site: Knee | Laterality: Right

## 2013-11-24 MED ORDER — ASPIRIN EC 325 MG PO TBEC
325.0000 mg | DELAYED_RELEASE_TABLET | Freq: Two times a day (BID) | ORAL | Status: DC
  Administered 2013-11-25 – 2013-11-27 (×5): 325 mg via ORAL
  Filled 2013-11-24 (×7): qty 1

## 2013-11-24 MED ORDER — PHENYLEPHRINE 40 MCG/ML (10ML) SYRINGE FOR IV PUSH (FOR BLOOD PRESSURE SUPPORT)
PREFILLED_SYRINGE | INTRAVENOUS | Status: AC
  Filled 2013-11-24: qty 10

## 2013-11-24 MED ORDER — ONDANSETRON HCL 4 MG/2ML IJ SOLN
INTRAMUSCULAR | Status: AC
  Filled 2013-11-24: qty 2

## 2013-11-24 MED ORDER — METHOCARBAMOL 1000 MG/10ML IJ SOLN
500.0000 mg | Freq: Four times a day (QID) | INTRAVENOUS | Status: DC | PRN
  Filled 2013-11-24: qty 5

## 2013-11-24 MED ORDER — BISACODYL 10 MG RE SUPP
10.0000 mg | Freq: Every day | RECTAL | Status: DC | PRN
Start: 2013-11-24 — End: 2013-11-27

## 2013-11-24 MED ORDER — PROPOFOL 10 MG/ML IV BOLUS
INTRAVENOUS | Status: AC
  Filled 2013-11-24: qty 20

## 2013-11-24 MED ORDER — CLINDAMYCIN PHOSPHATE 900 MG/50ML IV SOLN
900.0000 mg | INTRAVENOUS | Status: AC
  Administered 2013-11-24: 900 mg via INTRAVENOUS

## 2013-11-24 MED ORDER — MAGNESIUM CITRATE PO SOLN: 1.0000 | Freq: Once | ORAL | Status: AC | PRN

## 2013-11-24 MED ORDER — MEPERIDINE HCL 50 MG/ML IJ SOLN: 6.2500 mg | INTRAMUSCULAR | Status: DC | PRN

## 2013-11-24 MED ORDER — METHOCARBAMOL 500 MG PO TABS
500.0000 mg | ORAL_TABLET | Freq: Four times a day (QID) | ORAL | Status: DC | PRN
Start: 2013-11-24 — End: 2013-11-27
  Administered 2013-11-24 – 2013-11-26 (×2): 500 mg via ORAL
  Filled 2013-11-24 (×2): qty 1

## 2013-11-24 MED ORDER — METOCLOPRAMIDE HCL 10 MG PO TABS: 5.0000 mg | ORAL_TABLET | Freq: Three times a day (TID) | ORAL | Status: DC | PRN

## 2013-11-24 MED ORDER — CEFAZOLIN SODIUM-DEXTROSE 2-3 GM-% IV SOLR
INTRAVENOUS | Status: AC
  Filled 2013-11-24: qty 50

## 2013-11-24 MED ORDER — 0.9 % SODIUM CHLORIDE (POUR BTL) OPTIME
TOPICAL | Status: DC | PRN
  Administered 2013-11-24: 1000 mL

## 2013-11-24 MED ORDER — PROMETHAZINE HCL 25 MG/ML IJ SOLN: 6.2500 mg | INTRAMUSCULAR | Status: DC | PRN

## 2013-11-24 MED ORDER — BUPIVACAINE LIPOSOME 1.3 % IJ SUSP
20.0000 mL | INTRAMUSCULAR | Status: AC
  Filled 2013-11-24: qty 20

## 2013-11-24 MED ORDER — MIDAZOLAM HCL 2 MG/2ML IJ SOLN
INTRAMUSCULAR | Status: AC
  Filled 2013-11-24: qty 2

## 2013-11-24 MED ORDER — BUPIVACAINE-EPINEPHRINE (PF) 0.25% -1:200000 IJ SOLN
INTRAMUSCULAR | Status: DC | PRN
  Administered 2013-11-24: 30 mL

## 2013-11-24 MED ORDER — SODIUM CHLORIDE 0.9 % IV SOLN
INTRAVENOUS | Status: DC
  Administered 2013-11-24 – 2013-11-25 (×2): via INTRAVENOUS
  Filled 2013-11-24 (×9): qty 1000

## 2013-11-24 MED ORDER — PHENOL 1.4 % MT LIQD
1.0000 | OROMUCOSAL | Status: DC | PRN
  Filled 2013-11-24: qty 177

## 2013-11-24 MED ORDER — ONDANSETRON HCL 4 MG PO TABS
4.0000 mg | ORAL_TABLET | Freq: Four times a day (QID) | ORAL | Status: DC | PRN
Start: 2013-11-24 — End: 2013-11-27
  Administered 2013-11-26: 4 mg via ORAL
  Filled 2013-11-24: qty 1

## 2013-11-24 MED ORDER — BUPIVACAINE LIPOSOME 1.3 % IJ SUSP
INTRAMUSCULAR | Status: DC | PRN
  Administered 2013-11-24: 20 mL

## 2013-11-24 MED ORDER — HYDROMORPHONE HCL PF 1 MG/ML IJ SOLN: 0.5000 mg | INTRAMUSCULAR | Status: DC | PRN

## 2013-11-24 MED ORDER — SODIUM CHLORIDE 0.9 % IJ SOLN
INTRAMUSCULAR | Status: DC | PRN
  Administered 2013-11-24: 9 mL via INTRAVENOUS

## 2013-11-24 MED ORDER — METOCLOPRAMIDE HCL 5 MG/ML IJ SOLN: 5.0000 mg | Freq: Three times a day (TID) | INTRAMUSCULAR | Status: DC | PRN

## 2013-11-24 MED ORDER — OXYCODONE HCL 5 MG PO TABS: 5.0000 mg | ORAL_TABLET | Freq: Once | ORAL | Status: DC | PRN

## 2013-11-24 MED ORDER — LACTATED RINGERS IV SOLN
INTRAVENOUS | Status: DC
  Administered 2013-11-24 (×3): via INTRAVENOUS

## 2013-11-24 MED ORDER — CLINDAMYCIN PHOSPHATE 600 MG/50ML IV SOLN
600.0000 mg | Freq: Four times a day (QID) | INTRAVENOUS | Status: AC
  Administered 2013-11-24 (×2): 600 mg via INTRAVENOUS
  Filled 2013-11-24 (×2): qty 50

## 2013-11-24 MED ORDER — DIPHENHYDRAMINE HCL 25 MG PO CAPS
25.0000 mg | ORAL_CAPSULE | Freq: Four times a day (QID) | ORAL | Status: DC | PRN
  Filled 2013-11-24: qty 1

## 2013-11-24 MED ORDER — POLYETHYLENE GLYCOL 3350 17 G PO PACK
17.0000 g | PACK | Freq: Two times a day (BID) | ORAL | Status: DC
  Administered 2013-11-24 – 2013-11-26 (×4): 17 g via ORAL

## 2013-11-24 MED ORDER — ALUM & MAG HYDROXIDE-SIMETH 200-200-20 MG/5ML PO SUSP
30.0000 mL | ORAL | Status: DC | PRN
Start: 2013-11-24 — End: 2013-11-27

## 2013-11-24 MED ORDER — HYDROCODONE-ACETAMINOPHEN 7.5-325 MG PO TABS
1.0000 | ORAL_TABLET | ORAL | Status: DC
  Administered 2013-11-24 – 2013-11-27 (×11): 1 via ORAL
  Administered 2013-11-27: 2 via ORAL
  Filled 2013-11-24: qty 1
  Filled 2013-11-24: qty 2
  Filled 2013-11-24 (×10): qty 1

## 2013-11-24 MED ORDER — MENTHOL 3 MG MT LOZG
1.0000 | LOZENGE | OROMUCOSAL | Status: DC | PRN
Start: 2013-11-24 — End: 2013-11-27
  Filled 2013-11-24: qty 9

## 2013-11-24 MED ORDER — CLINDAMYCIN PHOSPHATE 900 MG/50ML IV SOLN
INTRAVENOUS | Status: AC
  Filled 2013-11-24: qty 50

## 2013-11-24 MED ORDER — SODIUM CHLORIDE 0.9 % IV SOLN: 60.0000 mL | Freq: Once | INTRAVENOUS | Status: DC

## 2013-11-24 MED ORDER — SODIUM CHLORIDE 0.9 % IR SOLN
Status: DC | PRN
  Administered 2013-11-24: 1000 mL

## 2013-11-24 MED ORDER — ONDANSETRON HCL 4 MG/2ML IJ SOLN: 4.0000 mg | Freq: Four times a day (QID) | INTRAMUSCULAR | Status: DC | PRN

## 2013-11-24 MED ORDER — FERROUS SULFATE 325 (65 FE) MG PO TABS
325.0000 mg | ORAL_TABLET | Freq: Three times a day (TID) | ORAL | Status: DC
  Administered 2013-11-25 – 2013-11-27 (×4): 325 mg via ORAL
  Filled 2013-11-24 (×10): qty 1

## 2013-11-24 MED ORDER — BUPIVACAINE-EPINEPHRINE (PF) 0.25% -1:200000 IJ SOLN
INTRAMUSCULAR | Status: AC
  Filled 2013-11-24: qty 30

## 2013-11-24 MED ORDER — FENTANYL CITRATE 0.05 MG/ML IJ SOLN
INTRAMUSCULAR | Status: DC | PRN
  Administered 2013-11-24 (×2): 50 ug via INTRAVENOUS

## 2013-11-24 MED ORDER — TRANEXAMIC ACID 100 MG/ML IV SOLN
1000.0000 mg | Freq: Once | INTRAVENOUS | Status: AC
  Administered 2013-11-24: 1000 mg via INTRAVENOUS
  Filled 2013-11-24: qty 10

## 2013-11-24 MED ORDER — KETOROLAC TROMETHAMINE 30 MG/ML IJ SOLN
INTRAMUSCULAR | Status: DC | PRN
  Administered 2013-11-24: 30 mg via INTRAVENOUS

## 2013-11-24 MED ORDER — ONDANSETRON HCL 4 MG/2ML IJ SOLN
INTRAMUSCULAR | Status: DC | PRN
  Administered 2013-11-24: 4 mg via INTRAVENOUS

## 2013-11-24 MED ORDER — FENTANYL CITRATE 0.05 MG/ML IJ SOLN
INTRAMUSCULAR | Status: AC
  Filled 2013-11-24: qty 2

## 2013-11-24 MED ORDER — DOCUSATE SODIUM 100 MG PO CAPS
100.0000 mg | ORAL_CAPSULE | Freq: Two times a day (BID) | ORAL | Status: DC
  Administered 2013-11-24 – 2013-11-27 (×5): 100 mg via ORAL

## 2013-11-24 MED ORDER — OXYCODONE HCL 5 MG/5ML PO SOLN
5.0000 mg | Freq: Once | ORAL | Status: DC | PRN
  Filled 2013-11-24: qty 5

## 2013-11-24 MED ORDER — CELECOXIB 200 MG PO CAPS
200.0000 mg | ORAL_CAPSULE | Freq: Two times a day (BID) | ORAL | Status: DC
  Administered 2013-11-24 – 2013-11-27 (×5): 200 mg via ORAL
  Filled 2013-11-24 (×7): qty 1

## 2013-11-24 MED ORDER — PHENYLEPHRINE HCL 10 MG/ML IJ SOLN
INTRAMUSCULAR | Status: DC | PRN
  Administered 2013-11-24: 80 ug via INTRAVENOUS

## 2013-11-24 MED ORDER — SODIUM CHLORIDE 0.9 % IJ SOLN
INTRAMUSCULAR | Status: AC
  Filled 2013-11-24: qty 10

## 2013-11-24 MED ORDER — KETOROLAC TROMETHAMINE 30 MG/ML IJ SOLN
INTRAMUSCULAR | Status: AC
  Filled 2013-11-24: qty 1

## 2013-11-24 MED ORDER — HYDROMORPHONE HCL PF 1 MG/ML IJ SOLN: 0.2500 mg | INTRAMUSCULAR | Status: DC | PRN

## 2013-11-24 MED ORDER — MIDAZOLAM HCL 5 MG/5ML IJ SOLN
INTRAMUSCULAR | Status: DC | PRN
  Administered 2013-11-24 (×2): 1 mg via INTRAVENOUS

## 2013-11-24 MED ORDER — PROPOFOL INFUSION 10 MG/ML OPTIME
INTRAVENOUS | Status: DC | PRN
  Administered 2013-11-24: 100 ug/kg/min via INTRAVENOUS

## 2013-11-24 SURGICAL SUPPLY — 50 items
BAG ZIPLOCK 12X15 (MISCELLANEOUS) IMPLANT
BANDAGE ELASTIC 6 VELCRO ST LF (GAUZE/BANDAGES/DRESSINGS) ×3 IMPLANT
BANDAGE ESMARK 6X9 LF (GAUZE/BANDAGES/DRESSINGS) ×1 IMPLANT
BLADE SAW SGTL 13.0X1.19X90.0M (BLADE) ×3 IMPLANT
BNDG ESMARK 6X9 LF (GAUZE/BANDAGES/DRESSINGS) ×3
BONE CEMENT GENTAMICIN (Cement) ×6 IMPLANT
BOWL SMART MIX CTS (DISPOSABLE) ×3 IMPLANT
CAP KNEE ATTUNE RP ×3 IMPLANT
CEMENT BONE GENTAMICIN 40 (Cement) ×2 IMPLANT
CUFF TOURN SGL QUICK 34 (TOURNIQUET CUFF) ×2
CUFF TRNQT CYL 34X4X40X1 (TOURNIQUET CUFF) ×1 IMPLANT
DERMABOND ADVANCED (GAUZE/BANDAGES/DRESSINGS) ×2
DERMABOND ADVANCED .7 DNX12 (GAUZE/BANDAGES/DRESSINGS) ×1 IMPLANT
DRAPE EXTREMITY T 121X128X90 (DRAPE) ×3 IMPLANT
DRAPE POUCH INSTRU U-SHP 10X18 (DRAPES) ×3 IMPLANT
DRAPE U-SHAPE 47X51 STRL (DRAPES) ×3 IMPLANT
DRSG AQUACEL AG ADV 3.5X10 (GAUZE/BANDAGES/DRESSINGS) ×3 IMPLANT
DURAPREP 26ML APPLICATOR (WOUND CARE) ×6 IMPLANT
ELECT REM PT RETURN 9FT ADLT (ELECTROSURGICAL) ×3
ELECTRODE REM PT RTRN 9FT ADLT (ELECTROSURGICAL) ×1 IMPLANT
FACESHIELD WRAPAROUND (MASK) ×15 IMPLANT
GLOVE BIOGEL PI IND STRL 7.5 (GLOVE) ×1 IMPLANT
GLOVE BIOGEL PI IND STRL 8 (GLOVE) ×1 IMPLANT
GLOVE BIOGEL PI INDICATOR 7.5 (GLOVE) ×2
GLOVE BIOGEL PI INDICATOR 8 (GLOVE) ×2
GLOVE ECLIPSE 8.0 STRL XLNG CF (GLOVE) ×3 IMPLANT
GLOVE ORTHO TXT STRL SZ7.5 (GLOVE) ×6 IMPLANT
GOWN SPEC L3 XXLG W/TWL (GOWN DISPOSABLE) ×3 IMPLANT
GOWN STRL REUS W/TWL LRG LVL3 (GOWN DISPOSABLE) ×3 IMPLANT
HANDPIECE INTERPULSE COAX TIP (DISPOSABLE) ×2
KIT BASIN OR (CUSTOM PROCEDURE TRAY) ×3 IMPLANT
MANIFOLD NEPTUNE II (INSTRUMENTS) ×3 IMPLANT
NDL SAFETY ECLIPSE 18X1.5 (NEEDLE) ×1 IMPLANT
NEEDLE HYPO 18GX1.5 SHARP (NEEDLE) ×2
PACK TOTAL JOINT (CUSTOM PROCEDURE TRAY) ×3 IMPLANT
POSITIONER SURGICAL ARM (MISCELLANEOUS) ×3 IMPLANT
SET HNDPC FAN SPRY TIP SCT (DISPOSABLE) ×1 IMPLANT
SET PAD KNEE POSITIONER (MISCELLANEOUS) ×3 IMPLANT
SUCTION FRAZIER 12FR DISP (SUCTIONS) ×3 IMPLANT
SUT MNCRL AB 4-0 PS2 18 (SUTURE) ×3 IMPLANT
SUT VIC AB 1 CT1 36 (SUTURE) ×3 IMPLANT
SUT VIC AB 2-0 CT1 27 (SUTURE) ×6
SUT VIC AB 2-0 CT1 TAPERPNT 27 (SUTURE) ×3 IMPLANT
SUT VLOC 180 0 24IN GS25 (SUTURE) ×3 IMPLANT
SYRINGE 60CC LL (MISCELLANEOUS) ×3 IMPLANT
TOWEL OR 17X26 10 PK STRL BLUE (TOWEL DISPOSABLE) ×3 IMPLANT
TOWEL OR NON WOVEN STRL DISP B (DISPOSABLE) IMPLANT
TRAY FOLEY CATH 14FRSI W/METER (CATHETERS) ×3 IMPLANT
WATER STERILE IRR 1500ML POUR (IV SOLUTION) ×3 IMPLANT
WRAP KNEE MAXI GEL POST OP (GAUZE/BANDAGES/DRESSINGS) ×3 IMPLANT

## 2013-11-24 NOTE — Anesthesia Procedure Notes (Signed)
Spinal  Patient location during procedure: OR Staffing Anesthesiologist: Nolon Nations R Performed by: anesthesiologist  Preanesthetic Checklist Completed: patient identified, site marked, surgical consent, pre-op evaluation, timeout performed, IV checked, risks and benefits discussed and monitors and equipment checked Spinal Block Patient position: sitting Prep: Betadine Patient monitoring: heart rate, continuous pulse ox and blood pressure Approach: right paramedian Location: L3-4 Injection technique: single-shot Needle Needle type: Sprotte  Needle gauge: 24 G Needle length: 9 cm Assessment Sensory level: T8 Additional Notes Expiration date of kit checked and confirmed. Patient tolerated procedure well, without complications.

## 2013-11-24 NOTE — Anesthesia Preprocedure Evaluation (Addendum)
Anesthesia Evaluation  Patient identified by MRN, date of birth, ID band Patient awake    Reviewed: Allergy & Precautions, H&P , NPO status , Patient's Chart, lab work & pertinent test results, reviewed documented beta blocker date and time   Airway Mallampati: II TM Distance: >3 FB Neck ROM: full    Dental   Pulmonary pneumonia -,  breath sounds clear to auscultation        Cardiovascular negative cardio ROS  Rhythm:regular     Neuro/Psych negative neurological ROS  negative psych ROS   GI/Hepatic negative GI ROS, Neg liver ROS,   Endo/Other  negative endocrine ROS  Renal/GU negative Renal ROS     Musculoskeletal   Abdominal   Peds  Hematology negative hematology ROS (+) Blood dyscrasia, anemia ,   Anesthesia Other Findings See surgeon's H&P   Reproductive/Obstetrics negative OB ROS                           Anesthesia Physical  Anesthesia Plan  ASA: II  Anesthesia Plan: Spinal   Post-op Pain Management:    Induction:   Airway Management Planned:   Additional Equipment:   Intra-op Plan:   Post-operative Plan:   Informed Consent: I have reviewed the patients History and Physical, chart, labs and discussed the procedure including the risks, benefits and alternatives for the proposed anesthesia with the patient or authorized representative who has indicated his/her understanding and acceptance.   Dental Advisory Given  Plan Discussed with: CRNA  Anesthesia Plan Comments:       Anesthesia Quick Evaluation

## 2013-11-24 NOTE — Anesthesia Postprocedure Evaluation (Signed)
Anesthesia Post Note  Patient: Caitlyn Newman  Procedure(s) Performed: Procedure(s) (LRB): RIGHT TOTAL KNEE ARTHROPLASTY (Right)  Anesthesia type: Spinal  Patient location: PACU  Post pain: Pain level controlled  Post assessment: Post-op Vital signs reviewed  Last Vitals: BP 144/58  Pulse 48  Temp(Src) 36.6 C (Oral)  Resp 14  Ht 5\' 4"  (1.626 m)  Wt 159 lb (72.122 kg)  BMI 27.28 kg/m2  SpO2 100%  Post vital signs: Reviewed  Level of consciousness: sedated  Complications: No apparent anesthesia complications

## 2013-11-24 NOTE — Interval H&P Note (Signed)
History and Physical Interval Note:  11/24/2013 11:37 AM  Caitlyn Newman  has presented today for surgery, with the diagnosis of RIGHT KNEE OA  The various methods of treatment have been discussed with the patient and family. After consideration of risks, benefits and other options for treatment, the patient has consented to  Procedure(s): RIGHT TOTAL KNEE ARTHROPLASTY (Right) as a surgical intervention .  The patient's history has been reviewed, patient examined, no change in status, stable for surgery.  I have reviewed the patient's chart and labs.  Questions were answered to the patient's satisfaction.     Shelda Pal

## 2013-11-24 NOTE — Op Note (Signed)
NAME:  Caitlyn Newman                      MEDICAL RECORD NO.:  [id number]                             FACILITY:  [facility]      PHYSICIAN:  Madlyn Frankel. Charlann Boxer, M.D.  DATE OF BIRTH:  09/28/1950      DATE OF PROCEDURE:  11/24/2013                                     OPERATIVE REPORT         PREOPERATIVE DIAGNOSIS:  Right knee osteoarthritis.      POSTOPERATIVE DIAGNOSIS:  Right knee osteoarthritis.      FINDINGS:  The patient was noted to have complete loss of cartilage and   bone-on-bone arthritis with associated osteophytes in all three compartments worse laterally with a valgus knee with a significant synovitis and associated effusion.      PROCEDURE:  Right total knee replacement.      COMPONENTS USED:  DePuy Attune rotating platform posterior stabilized knee   system, a size 5N femur, 4 tibia, size 6 mm AOX insert, and 35 Anatomic patellar   button.      SURGEON:  Madlyn Frankel. Charlann Boxer, M.D.      ASSISTANT:  Lanney Gins, PA-C.      ANESTHESIA:  Spinal.      SPECIMENS:  None.      COMPLICATION:  None.      DRAINS:  None.  EBL: <100c      TOURNIQUET TIME:   Total Tourniquet Time Documented: Thigh (Right) - 38 minutes Total: Thigh (Right) - 38 minutes  .      The patient was stable to the recovery room.      INDICATION FOR PROCEDURE:  Caitlyn Newman is a 63 y.o. female patient of   mine.  The patient had been seen, evaluated, and treated conservatively in the   office with medication, activity modification, and injections.  The patient had   radiographic changes of bone-on-bone arthritis with endplate sclerosis and osteophytes noted.      The patient failed conservative measures including medication, injections, and activity modification, and at this point was ready for more definitive measures.   Based on the radiographic changes and failed conservative measures, the patient   decided to proceed with total knee replacement.  Risks of infection,   DVT, component  failure, need for revision surgery, postop course, and   expectations were all   discussed and reviewed.  Consent was obtained for benefit of pain   relief.      PROCEDURE IN DETAIL:  The patient was brought to the operative theater.   Once adequate anesthesia, preoperative antibiotics, 900mg  of Cleocin administered, the patient was positioned supine with the right thigh tourniquet placed.  The  right lower extremity was prepped and draped in sterile fashion.  A time-   out was performed identifying the patient, planned procedure, and   extremity.      The right lower extremity was placed in the [facility] leg holder.  The leg was   exsanguinated, tourniquet elevated to 250 mmHg.  A midline incision was   made followed by median parapatellar arthrotomy.  Following initial   exposure, attention was first directed  to the patella.  Precut   measurement was noted to be 23 mm.  I resected down to 14 mm and used a   35 patellar button to restore patellar height as well as cover the cut   surface.      The lug holes were drilled and a metal shim was placed to protect the   patella from retractors and saw blades.      At this point, attention was now directed to the femur.  The femoral   canal was opened with a drill, irrigated to try to prevent fat emboli.  An   intramedullary rod was passed at 5 degrees valgus, 9 mm of bone was   resected off the distal femur.  Following this resection, the tibia was   subluxated anteriorly.  Using the extramedullary guide, 4 mm of bone was resected off   the proximal lateral tibia.  We confirmed the gap would be   stable medially and laterally with a 6 mm insert as well as confirmed   the cut was perpendicular in the coronal plane, checking with an alignment rod.      Once this was done, I sized the femur to be a size 5 in the anterior-   posterior dimension, chose a narrow component based on medial and   lateral dimension.  The size 5 rotation block was then  pinned in   position anterior referenced using tensioning device to set rotation and match flexion and extension gaps.  The   anterior, posterior, and  chamfer cuts were made without difficulty nor   notching making certain that I was along the anterior cortex to help   with flexion gap stability.      The final box cut was made off the lateral aspect of distal femur.      At this point, the tibia was sized to be a size 4, the size 4 tray was   then pinned in position through the medial third of the tubercle,   drilled, and keel punched.  Trial reduction was now carried with a 5 femur,  4 tibia, a size6 mm insert, and the 35 Anatomic patella botton.  The knee was brought to   extension, full extension with good flexion stability with the patella   tracking through the trochlea without application of pressure.  Given   all these findings, the trial components removed.  Final components were   opened and cement was mixed.  The knee was irrigated with normal saline   solution and pulse lavage.  The synovial lining was   then injected with 20cc of Exparel, 30cc of 0.25% Marcaine with epinephrine and 1 cc of Toradol,   total of 61 cc.      The knee was irrigated.  Final implants were then cemented onto clean and   dried cut surfaces of bone with the knee brought to extension with a 6   mm trial insert.      Once the cement had fully cured, the excess cement was removed   throughout the knee.  I confirmed I was satisfied with the range of   motion and stability, and the final 6 mm PS AOX insert was chosen.  It was   placed into the knee.      The tourniquet had been let down at 38 minutes.  No significant   hemostasis required.  The   extensor mechanism was then reapproximated using #1 Vicryl and #0 V-lock sutures with  the knee   in flexion.  The   remaining wound was closed with 2-0 Vicryl and running 4-0 Monocryl.   The knee was cleaned, dried, dressed sterilely using Dermabond and    Aquacel dressing.  The patient was then   brought to recovery room in stable condition, tolerating the procedure   well.   Please note that Physician Assistant, Lanney Gins, was present for the entirety of the case, and was utilized for pre-operative positioning, peri-operative retractor management, general facilitation of the procedure.  He was also utilized for primary wound closure at the end of the case.              Madlyn Frankel Charlann Boxer, M.D.    11/24/2013 2:18 PM

## 2013-11-24 NOTE — Transfer of Care (Signed)
Immediate Anesthesia Transfer of Care Note  Patient: Caitlyn Newman  Procedure(s) Performed: Procedure(s): RIGHT TOTAL KNEE ARTHROPLASTY (Right)  Patient Location: PACU  Anesthesia Type:Spinal  Level of Consciousness: awake, alert , oriented and patient cooperative  Airway & Oxygen Therapy: Patient Spontanous Breathing and Patient connected to face mask oxygen  Post-op Assessment: Report given to PACU RN and Post -op Vital signs reviewed and stable  Post vital signs: Reviewed and stable  Complications: No apparent anesthesia complications

## 2013-11-24 NOTE — Plan of Care (Signed)
Problem: Consults Goal: Diagnosis- Total Joint Replacement Right total knee     

## 2013-11-25 DIAGNOSIS — E663 Overweight: Secondary | ICD-10-CM | POA: Diagnosis present

## 2013-11-25 DIAGNOSIS — D62 Acute posthemorrhagic anemia: Secondary | ICD-10-CM | POA: Diagnosis not present

## 2013-11-25 LAB — BASIC METABOLIC PANEL
Anion gap: 10 (ref 5–15)
BUN: 9 mg/dL (ref 6–23)
CALCIUM: 8.4 mg/dL (ref 8.4–10.5)
CO2: 26 mEq/L (ref 19–32)
Chloride: 103 mEq/L (ref 96–112)
Creatinine, Ser: 0.58 mg/dL (ref 0.50–1.10)
GFR calc Af Amer: >90 mL/min (ref >90)
GFR calc non Af Amer: >90 mL/min (ref >90)
GLUCOSE: 131 mg/dL — AB (ref 70–99)
POTASSIUM: 4.3 meq/L (ref 3.7–5.3)
Sodium: 139 mEq/L (ref 137–147)

## 2013-11-25 LAB — CBC
HCT: 30.6 % — ABNORMAL LOW (ref 36.0–46.0)
HEMOGLOBIN: 10.3 g/dL — AB (ref 12.0–15.0)
MCH: 29.2 pg (ref 26.0–34.0)
MCHC: 33.7 g/dL (ref 30.0–36.0)
MCV: 86.7 fL (ref 78.0–100.0)
Platelets: 239 10*3/uL (ref 150–400)
RBC: 3.53 MIL/uL — AB (ref 3.87–5.11)
RDW: 14.5 % (ref 11.5–15.5)
WBC: 8.6 10*3/uL (ref 4.0–10.5)

## 2013-11-25 MED ORDER — POLYETHYLENE GLYCOL 3350 17 G PO PACK: 17.0000 g | PACK | Freq: Two times a day (BID) | ORAL | Status: DC

## 2013-11-25 MED ORDER — FERROUS SULFATE 325 (65 FE) MG PO TABS: 325.0000 mg | ORAL_TABLET | Freq: Three times a day (TID) | ORAL | Status: AC

## 2013-11-25 MED ORDER — METHOCARBAMOL 500 MG PO TABS: 500.0000 mg | ORAL_TABLET | Freq: Four times a day (QID) | ORAL | Status: DC | PRN

## 2013-11-25 MED ORDER — HYDROCODONE-ACETAMINOPHEN 7.5-325 MG PO TABS: 1.0000 | ORAL_TABLET | ORAL | Status: DC | PRN

## 2013-11-25 MED ORDER — DSS 100 MG PO CAPS: 100.0000 mg | ORAL_CAPSULE | Freq: Two times a day (BID) | ORAL | Status: DC

## 2013-11-25 MED ORDER — ASPIRIN 325 MG PO TBEC: 325.0000 mg | DELAYED_RELEASE_TABLET | Freq: Two times a day (BID) | ORAL | Status: AC

## 2013-11-25 NOTE — Progress Notes (Signed)
     Subjective: 1 Day Post-Op Procedure(s) (LRB): RIGHT TOTAL KNEE ARTHROPLASTY (Right)   Patient reports pain as mild, pain controlled. No events throughout the night. Still planning on going home after her hospital stay.  Objective:   VITALS:   Filed Vitals:   11/25/13   BP: 162/74  Pulse: 62  Temp: 98.1 F (36.7 C)   Resp: 15    Dorsiflexion/Plantar flexion intact Incision: dressing C/D/I No cellulitis present Compartment soft  LABS  Recent Labs  11/25/13 0415  HGB 10.3*  HCT 30.6*  WBC 8.6  PLT 239     Recent Labs  11/25/13 0415  NA 139  K 4.3  BUN 9  CREATININE 0.58  GLUCOSE 131*     Assessment/Plan: 1 Day Post-Op Procedure(s) (LRB): RIGHT TOTAL KNEE ARTHROPLASTY (Right) Foley cath d/c'ed Advance diet Up with therapy D/C IV fluids Discharge home with home health eventually, when ready  Expected ABLA  Treated with iron and will observe  Overweight (BMI 25-29.9) Estimated body mass index is 27.28 kg/(m^2) as calculated from the following:   Height as of this encounter: 5\' 4"  (1.626 m).   Weight as of this encounter: 72.122 kg (159 lb). Patient also counseled that weight may inhibit the healing process Patient counseled that losing weight will help with future health issues      . Caitlyn Newman   PAC  11/25/2013, 9:07 AM

## 2013-11-25 NOTE — Progress Notes (Signed)
[facility]  [facility] is providing the following services: Patient declined rw and commode - has both at home.   If patient discharges after hours, please call [phone].   Renard Hamper 11/25/2013, 8:30 AM

## 2013-11-25 NOTE — Progress Notes (Signed)
Physical Therapy Treatment Patient Details Name: Caitlyn Newman MRN: [id number] DOB: [birth year] Today's Date: 21-Dec-2013    History of Present Illness R TKR    PT Comments      Follow Up Recommendations  Home health PT     Equipment Recommendations  None recommended by PT    Recommendations for Other Services OT consult     Precautions / Restrictions Precautions Precautions: Knee;Fall Restrictions Weight Bearing Restrictions: No Other Position/Activity Restrictions: WBAT    Mobility  Bed Mobility Overal bed mobility: Needs Assistance Bed Mobility: Supine to Sit;Sit to Supine     Supine to sit: Min guard Sit to supine: Min guard   General bed mobility comments: cues for sequence and use of L LE to self assist  Transfers Overall transfer level: Needs assistance Equipment used: Rolling walker (2 wheeled) Transfers: Sit to/from Stand Sit to Stand: Min assist         General transfer comment: cues for LE management and use of UEs to self assist  Ambulation/Gait Ambulation/Gait assistance: Min assist Ambulation Distance (Feet): 122 Feet (and 15') Assistive device: Rolling walker (2 wheeled) Gait Pattern/deviations: Decreased step length - right;Decreased step length - left;Step-to pattern;Step-through pattern;Shuffle;Trunk flexed     General Gait Details: cues for sequence, posture and position from [employer]            Wheelchair Mobility    Modified Rankin (Stroke Patients Only)       Balance                                    Cognition Arousal/Alertness: Awake/alert Behavior During Therapy: WFL for tasks assessed/performed Overall Cognitive Status: Within Functional Limits for tasks assessed                      Exercises      General Comments        Pertinent Vitals/Pain Pain Assessment: 0-10 Pain Score: 4  Pain Location: R knee Pain Descriptors / Indicators: Aching Pain Intervention(s):  Monitored during session;Limited activity within patient's tolerance;Premedicated before session;Ice applied    Home Living                      Prior Function            PT Goals (current goals can now be found in the care plan section) Acute Rehab PT Goals Patient Stated Goal: Walk without pain PT Goal Formulation: With patient Time For Goal Achievement: 12/01/13 Potential to Achieve Goals: Good Progress towards PT goals: Progressing toward goals    Frequency  7X/week    PT Plan Current plan remains appropriate    Co-evaluation             End of Session Equipment Utilized During Treatment: Gait belt Activity Tolerance: Patient tolerated treatment well Patient left: in bed;with call bell/phone within reach     Time: 4742-5956 PT Time Calculation (min): 20 min  Charges:  $Gait Training: 8-22 mins                    G Codes:      Jereline Ticer 2013-12-21, 4:57 PM

## 2013-11-25 NOTE — Evaluation (Signed)
Physical Therapy Evaluation Patient Details Name: Caitlyn Newman MRN: [id number] DOB: 1950/10/15 Today's Date: 11/25/2013   History of Present Illness  R TKR  Clinical Impression  Pt s/p R TKR presents with decreased R LE strength/ROM and post op pain limiting functional mobility.  Pt should progress to d/c home with family assist and HHPT follow up    Follow Up Recommendations Home health PT    Equipment Recommendations  None recommended by PT    Recommendations for Other Services OT consult     Precautions / Restrictions Precautions Precautions: Knee;Fall Restrictions Weight Bearing Restrictions: Yes Other Position/Activity Restrictions: WBAT      Mobility  Bed Mobility Overal bed mobility: Needs Assistance Bed Mobility: Supine to Sit     Supine to sit: Min assist     General bed mobility comments: cues for sequence and use of L LE to self assist  Transfers Overall transfer level: Needs assistance Equipment used: Rolling walker (2 wheeled) Transfers: Sit to/from Stand Sit to Stand: Min assist         General transfer comment: cues for LE management and use of UEs to self assist  Ambulation/Gait Ambulation/Gait assistance: Min assist Ambulation Distance (Feet): 60 Feet Assistive device: Rolling walker (2 wheeled) Gait Pattern/deviations: Step-to pattern;Decreased step length - right;Decreased step length - left;Shuffle;Trunk flexed     General Gait Details: cues for sequence, posture and position from [employer]            Wheelchair Mobility    Modified Rankin (Stroke Patients Only)       Balance                                             Pertinent Vitals/Pain Pain Assessment: 0-10 Pain Score: 5  Pain Location: R knee Pain Descriptors / Indicators: Aching;Dull Pain Intervention(s): Limited activity within patient's tolerance;Monitored during session;Premedicated before session;Ice applied    Home Living  Family/patient expects to be discharged to:: Private residence Living Arrangements: Other relatives Available Help at Discharge: Family Type of Home: House Home Access: Stairs to enter Entrance Stairs-Rails: Right Entrance Stairs-Number of Steps: 4 Home Layout: One level Home Equipment: Environmental consultant - 2 wheels;Walker - 4 wheels;Cane - single point Additional Comments: Sister will be assisting    Prior Function Level of Independence: Independent with assistive device(s)               Hand Dominance   Dominant Hand: Right    Extremity/Trunk Assessment   Upper Extremity Assessment: Overall WFL for tasks assessed           Lower Extremity Assessment: RLE deficits/detail RLE Deficits / Details: 3/5 quads with AAROM at knee -10 - 80    Cervical / Trunk Assessment: Normal  Communication   Communication: No difficulties  Cognition Arousal/Alertness: Awake/alert Behavior During Therapy: WFL for tasks assessed/performed Overall Cognitive Status: Within Functional Limits for tasks assessed                      General Comments      Exercises Total Joint Exercises Ankle Circles/Pumps: AROM;Both;15 reps;Supine Quad Sets: AROM;Both;10 reps;Supine Heel Slides: AAROM;Right;10 reps;Supine Straight Leg Raises: AAROM;AROM;Right;10 reps;Supine      Assessment/Plan    PT Assessment Patient needs continued PT services  PT Diagnosis Difficulty walking   PT Problem List Decreased strength;Decreased range of motion;Decreased activity  tolerance;Decreased mobility;Decreased knowledge of use of DME;Pain  PT Treatment Interventions DME instruction;Gait training;Stair training;Functional mobility training;Therapeutic activities;Therapeutic exercise;Patient/family education   PT Goals (Current goals can be found in the Care Plan section) Acute Rehab PT Goals Patient Stated Goal: Walk without pain PT Goal Formulation: With patient Time For Goal Achievement: 12/01/13 Potential to  Achieve Goals: Good    Frequency 7X/week   Barriers to discharge        Co-evaluation               End of Session Equipment Utilized During Treatment: Gait belt Activity Tolerance: Patient tolerated treatment well Patient left: in chair;with call bell/phone within reach;with family/visitor present Nurse Communication: Mobility status         Time: 5465-6812 PT Time Calculation (min): 30 min   Charges:   PT Evaluation $Initial PT Evaluation Tier I: 1 Procedure PT Treatments $Gait Training: 8-22 mins $Therapeutic Exercise: 8-22 mins   PT G Codes:          Donnamae Muilenburg 11/25/2013, 10:28 AM

## 2013-11-25 NOTE — Evaluation (Signed)
Occupational Therapy Evaluation Patient Details Name: Caitlyn Newman MRN: [id number] DOB: Nov 03, 1950 Today's Date: 11/25/2013    History of Present Illness R TKR   Clinical Impression   This 63 year old female was admitted for the above surgery.  All education was completed.  Pt demonstrated good safety during OT eval and will have sister's assistance.  No further OT is needed at this time.      Follow Up Recommendations  No OT follow up;Supervision/Assistance - 24 hour    Equipment Recommendations  None recommended by OT    Recommendations for Other Services       Precautions / Restrictions Precautions Precautions: Knee;Fall Restrictions Weight Bearing Restrictions: Yes Other Position/Activity Restrictions: WBAT      Mobility Bed Mobility              Transfers Overall transfer level: Needs assistance Equipment used: Rolling walker (2 wheeled) Transfers: Sit to/from Stand Sit to Stand: Min assist         General transfer comment: cues for LE management and use of UEs to self assist    Balance                                            ADL Overall ADL's : Needs assistance/impaired     Grooming: Oral care;Applying deodorant;Standing;Min guard   Upper Body Bathing: Set up;Sitting   Lower Body Bathing: Sit to/from stand;Minimal assistance   Upper Body Dressing : Set up;Sitting   Lower Body Dressing: Moderate assistance;Sit to/from stand   Toilet Transfer: Minimal assistance;Ambulation;BSC   Toileting- Clothing Manipulation and Hygiene: Minimal assistance;Sit to/from stand         General ADL Comments: ambulated to bathroom with min A and performed ADL while sitting on commode.  Pt does not have a reacher at home, but sister will assist as needed.  She uses her cane to retrieve things that fall.  pt states she has to step over a tub for a shower but there is a built in seat.  She has been sponge bathing and will continue to  do so.  Pt is not interested in tub bench.  Pt sidestepped through tight space in bathroom without cues.  She did ask about leg placement a couple of times for sit to stand and vice versa.       Vision                     Perception     Praxis      Pertinent Vitals/Pain Pain Assessment: 0-10 Pain Score: 2  Pain Location: R knee Pain Descriptors / Indicators: Aching Pain Intervention(s): Limited activity within patient's tolerance;Premedicated before session;Repositioned     Hand Dominance Right   Extremity/Trunk Assessment Upper Extremity Assessment Upper Extremity Assessment: Overall WFL for tasks assessed      Cervical / Trunk Assessment Cervical / Trunk Assessment: Normal   Communication Communication Communication: No difficulties   Cognition Arousal/Alertness: Awake/alert Behavior During Therapy: WFL for tasks assessed/performed Overall Cognitive Status: Within Functional Limits for tasks assessed                     General Comments       Exercises      Shoulder Instructions      Home Living Family/patient expects to be discharged to:: Private residence Living Arrangements: Other relatives  Available Help at Discharge: Family Type of Home: House Home Access: Stairs to enter [employer] of Steps: 4 Entrance Stairs-Rails: Right Home Layout: One level     Bathroom Shower/Tub: Tub/shower unit Shower/tub characteristics: Engineer, building services: Standard     Home Equipment: Bedside commode;Shower seat - built in (tub has built in seat)   Additional Comments: Sister will be assisting      Prior Functioning/Environment Level of Independence: Independent with assistive device(s)             OT Diagnosis:     OT Problem List:     OT Treatment/Interventions:      OT Goals(Current goals can be found in the care plan section) Acute Rehab OT Goals Patient Stated Goal: Walk without pain  OT Frequency:     Barriers  to D/C:            Co-evaluation              End of Session    Activity Tolerance: Patient tolerated treatment well Patient left: in chair;with call bell/phone within reach;with family/visitor present   Time: 8270-7867 OT Time Calculation (min): 30 min Charges:  OT General Charges $OT Visit: 1 Procedure OT Evaluation $Initial OT Evaluation Tier I: 1 Procedure OT Treatments $Self Care/Home Management : 23-37 mins G-Codes:    Niketa Turner 2013-11-28, 11:31 AM  Marica Otter, OTR/L [phone] Nov 28, 2013

## 2013-11-26 LAB — BASIC METABOLIC PANEL
Anion gap: 8 (ref 5–15)
BUN: 8 mg/dL (ref 6–23)
CHLORIDE: 103 meq/L (ref 96–112)
CO2: 28 meq/L (ref 19–32)
CREATININE: 0.56 mg/dL (ref 0.50–1.10)
Calcium: 8.6 mg/dL (ref 8.4–10.5)
GFR calc Af Amer: >90 mL/min (ref >90)
GFR calc non Af Amer: >90 mL/min (ref >90)
Glucose, Bld: 124 mg/dL — ABNORMAL HIGH (ref 70–99)
POTASSIUM: 4.1 meq/L (ref 3.7–5.3)
Sodium: 139 mEq/L (ref 137–147)

## 2013-11-26 LAB — CBC
HCT: 31.4 % — ABNORMAL LOW (ref 36.0–46.0)
Hemoglobin: 10.2 g/dL — ABNORMAL LOW (ref 12.0–15.0)
MCH: 28.7 pg (ref 26.0–34.0)
MCHC: 32.5 g/dL (ref 30.0–36.0)
MCV: 88.2 fL (ref 78.0–100.0)
Platelets: 200 10*3/uL (ref 150–400)
RBC: 3.56 MIL/uL — ABNORMAL LOW (ref 3.87–5.11)
RDW: 14.5 % (ref 11.5–15.5)
WBC: 9.9 10*3/uL (ref 4.0–10.5)

## 2013-11-26 NOTE — Progress Notes (Signed)
     Subjective: 2 Days Post-Op Procedure(s) (LRB): RIGHT TOTAL KNEE ARTHROPLASTY (Right)   Patient reports pain as mild, pain controlled. No events throughout the night. Little N&V this morning from taking pills on an empty stomach. Ready to be discharged home if her N&V resolves.  Instructed to take pills with at least a little food on the stomach.  Objective:   VITALS:   Filed Vitals:   11/26/13  BP: 131/74  Pulse: 101  Temp: 97.9 F (36.6 C)   Resp: 18    Dorsiflexion/Plantar flexion intact Incision: dressing C/D/I No cellulitis present Compartment soft  LABS  Recent Labs  11/25/13 0415 11/26/13 0440  HGB 10.3* 10.2*  HCT 30.6* 31.4*  WBC 8.6 9.9  PLT 239 200     Recent Labs  11/25/13 0415 11/26/13 0440  NA 139 139  K 4.3 4.1  BUN 9 8  CREATININE 0.58 0.56  GLUCOSE 131* 124*     Assessment/Plan: 2 Days Post-Op Procedure(s) (LRB): RIGHT TOTAL KNEE ARTHROPLASTY (Right) Up with therapy Discharge home with home health Follow up in 2 weeks at [facility]. Follow up with OLIN,Shanessa Hodak D in 2 weeks.  Contact information:  [facility] [address], [address] [place] [address] [phone]        Anastasio Auerbach. Dayanira Giovannetti   PAC  11/26/2013, 9:55 AM

## 2013-11-26 NOTE — Care Management Note (Signed)
    Page 1 of 1   11/26/2013     12:32:49 PM CARE MANAGEMENT NOTE 11/26/2013  Patient:  Caitlyn Newman, Caitlyn Newman   Account Number:  [id number]  Date Initiated:  11/25/2013  Documentation initiated by:  Gabriel Earing  Subjective/Objective Assessment:   pt admitted for knee replacement     Action/Plan:   from home   Anticipated DC Date:  11/27/2013   Anticipated DC Plan:  [place]  CM consult      [place]   Choice offered to / List presented to:  C-1 Patient        [place] arranged  HH-2 PT      [place]   Status of service:  Completed, signed off Medicare Important Message given?  NA - LOS <3 / Initial given by admissions (If response is "NO", the following Medicare IM given date fields will be blank) Date Medicare IM given:   Medicare IM given by:   Date Additional Medicare IM given:   Additional Medicare IM given by:    Discharge Disposition:  [place]  Per UR Regulation:  Reviewed for med. necessity/level of care/duration of stay  If discussed at [place] of Stay Meetings, dates discussed:    Comments:  11/26/13 12:30 Cm met with pt in room to confirm choice of home health agency; pt chooses [place] rep aware.  Pt has her own walker and a rollator and bedside commode.  No other CM needs were communicated.  Mariane Masters, BSN, [place].  11/25/13 MMcGibboney, RN, BSN Chart reviewed.

## 2013-11-26 NOTE — Progress Notes (Signed)
Physical Therapy Treatment Patient Details Name: Caitlyn Newman MRN: [id number] DOB: March 26, 1951 Today's Date: 11/26/2013    History of Present Illness R TKR    PT Comments    Progressing well, negotiated stairs this pm with min difficulty  Follow Up Recommendations  Home health PT     Equipment Recommendations  None recommended by PT    Recommendations for Other Services OT consult     Precautions / Restrictions Precautions Precautions: Knee;Fall Restrictions Weight Bearing Restrictions: No Other Position/Activity Restrictions: WBAT    Mobility  Bed Mobility Overal bed mobility: Needs Assistance Bed Mobility: Sit to Supine     Supine to sit: Supervision Sit to supine: Supervision   General bed mobility comments: cues for sequence and use of L LE to self assist  Transfers Overall transfer level: Needs assistance Equipment used: Rolling walker (2 wheeled) Transfers: Sit to/from Stand Sit to Stand: Supervision         General transfer comment: cues for LE management and use of UEs to self assist  Ambulation/Gait Ambulation/Gait assistance: Min guard;Supervision Ambulation Distance (Feet): 175 Feet Assistive device: Rolling walker (2 wheeled) Gait Pattern/deviations: Step-to pattern;Step-through pattern;Decreased step length - right;Decreased step length - left;Shuffle;Trunk flexed     General Gait Details: cues for sequence, posture and position from RW   Stairs Stairs: Yes Stairs assistance: Min assist Stair Management: One rail Left;Step to pattern;Forwards;With cane Number of Stairs: 8 General stair comments: 4 stairs twice with rail and cane; min cues for sequence and foot/cane placement.  Pt states this it how she did it prior to surgery  Wheelchair Mobility    Modified Rankin (Stroke Patients Only)       Balance                                    Cognition Arousal/Alertness: Awake/alert Behavior During Therapy: WFL  for tasks assessed/performed Overall Cognitive Status: Within Functional Limits for tasks assessed                      Exercises Total Joint Exercises Ankle Circles/Pumps: AROM;Both;15 reps;Supine Quad Sets: AROM;Both;Supine;15 reps Heel Slides: AAROM;Right;Supine;20 reps Straight Leg Raises: AAROM;AROM;Right;10 reps;Supine;20 reps Goniometric ROM: AAROM R knee -10 - 70    General Comments        Pertinent Vitals/Pain Pain Assessment: 0-10 Pain Score: 2  Pain Location: R knee Pain Descriptors / Indicators: Sore Pain Intervention(s): Limited activity within patient's tolerance;Monitored during session    Home Living                      Prior Function            PT Goals (current goals can now be found in the care plan section) Acute Rehab PT Goals Patient Stated Goal: Walk without pain PT Goal Formulation: With patient Time For Goal Achievement: 12/01/13 Potential to Achieve Goals: Good Progress towards PT goals: Progressing toward goals    Frequency  7X/week    PT Plan Current plan remains appropriate    Co-evaluation             End of Session Equipment Utilized During Treatment: Gait belt Activity Tolerance: Patient tolerated treatment well Patient left: in bed;with call bell/phone within reach;with family/visitor present     Time: 4356-8616 PT Time Calculation (min): 32 min  Charges:  $Gait Training: 23-37 mins $Therapeutic Exercise: 8-22 mins  G Codes:      Tyrrell Stephens December 12, 2013, 2:14 PM

## 2013-11-26 NOTE — Progress Notes (Signed)
Physical Therapy Treatment Patient Details Name: Curtis Uriarte MRN: [id number] DOB: 11-Jul-1950 Today's Date: December 04, 2013    History of Present Illness R TKR    PT Comments    Pt nauseated but feeling better and progressing this morning.  Follow Up Recommendations  Home health PT     Equipment Recommendations  None recommended by PT    Recommendations for Other Services OT consult     Precautions / Restrictions Precautions Precautions: Knee;Fall Restrictions Weight Bearing Restrictions: No Other Position/Activity Restrictions: WBAT    Mobility  Bed Mobility Overal bed mobility: Needs Assistance Bed Mobility: Supine to Sit     Supine to sit: Supervision     General bed mobility comments: cues for sequence and use of L LE to self assist  Transfers Overall transfer level: Needs assistance Equipment used: Rolling walker (2 wheeled) Transfers: Sit to/from Stand Sit to Stand: Min guard         General transfer comment: cues for LE management and use of UEs to self assist  Ambulation/Gait Ambulation/Gait assistance: Min guard Ambulation Distance (Feet): 156 Feet Assistive device: Rolling walker (2 wheeled) Gait Pattern/deviations: Step-to pattern;Step-through pattern;Decreased step length - right;Decreased step length - left;Shuffle;Antalgic;Trunk flexed     General Gait Details: cues for sequence, posture and position from [employer]            Wheelchair Mobility    Modified Rankin (Stroke Patients Only)       Balance                                    Cognition Arousal/Alertness: Awake/alert Behavior During Therapy: WFL for tasks assessed/performed Overall Cognitive Status: Within Functional Limits for tasks assessed                      Exercises Total Joint Exercises Ankle Circles/Pumps: AROM;Both;15 reps;Supine Quad Sets: AROM;Both;Supine;15 reps Heel Slides: AAROM;Right;Supine;20 reps Straight Leg  Raises: AAROM;AROM;Right;10 reps;Supine;20 reps Goniometric ROM: AAROM R knee -10 - 70    General Comments        Pertinent Vitals/Pain Pain Assessment: 0-10 Pain Score: 2  Pain Location: R knee Pain Descriptors / Indicators: Aching;Sore Pain Intervention(s): Limited activity within patient's tolerance;Monitored during session;Ice applied    Home Living                      Prior Function            PT Goals (current goals can now be found in the care plan section) Acute Rehab PT Goals Patient Stated Goal: Walk without pain PT Goal Formulation: With patient Time For Goal Achievement: 12/01/13 Potential to Achieve Goals: Good Progress towards PT goals: Progressing toward goals    Frequency  7X/week    PT Plan Current plan remains appropriate    Co-evaluation             End of Session Equipment Utilized During Treatment: Gait belt Activity Tolerance: Patient tolerated treatment well Patient left: in chair;with call bell/phone within reach     Time: 1040-1113 PT Time Calculation (min): 33 min  Charges:  $Gait Training: 8-22 mins $Therapeutic Exercise: 8-22 mins                    G Codes:      Vasiliy Mccarry Dec 04, 2013, 12:42 PM

## 2013-11-27 NOTE — Progress Notes (Signed)
     Subjective: 3 Days Post-Op Procedure(s) (LRB): RIGHT TOTAL KNEE ARTHROPLASTY (Right)   Patient reports pain as mild, pain controlled. No events throughout the night. Nausea has resolved. Ready to be discharged home.  Objective:   VITALS:   Filed Vitals:   11/27/13  BP: 119/62  Pulse: 66  Temp: 97.9 F (36.6 C)   Resp: 16    Dorsiflexion/Plantar flexion intact Incision: dressing C/D/I No cellulitis present Compartment soft  LABS  Recent Labs  11/25/13 0415 11/26/13 0440  HGB 10.3* 10.2*  HCT 30.6* 31.4*  WBC 8.6 9.9  PLT 239 200     Recent Labs  11/25/13 0415 11/26/13 0440  NA 139 139  K 4.3 4.1  BUN 9 8  CREATININE 0.58 0.56  GLUCOSE 131* 124*     Assessment/Plan: 3 Days Post-Op Procedure(s) (LRB): RIGHT TOTAL KNEE ARTHROPLASTY (Right) Up with therapy Discharge home with home health Follow up in 2 weeks at [facility]. Follow up with OLIN,Adithi Gammon D in 2 weeks.  Contact information:  [facility] [address] [place] [address] [phone]        Anastasio Auerbach. Chrystopher Stangl   PAC  11/27/2013, 8:28 AM

## 2013-11-27 NOTE — Progress Notes (Signed)
Physical Therapy Treatment Patient Details Name: Nielle Duford MRN: [id number] DOB: July 26, 1950 Today's Date: 11/27/2013    History of Present Illness R TKR    PT Comments    Eager for dc  Follow Up Recommendations  Home health PT     Equipment Recommendations  None recommended by PT    Recommendations for Other Services OT consult     Precautions / Restrictions Precautions Precautions: Knee;Fall Restrictions Weight Bearing Restrictions: No Other Position/Activity Restrictions: WBAT    Mobility  Bed Mobility Overal bed mobility: Needs Assistance Bed Mobility: Supine to Sit     Supine to sit: Supervision        Transfers Overall transfer level: Needs assistance Equipment used: Rolling walker (2 wheeled) Transfers: Sit to/from Stand Sit to Stand: Supervision         General transfer comment: cues for LE management and use of UEs to self assist  Ambulation/Gait Ambulation/Gait assistance: Supervision Ambulation Distance (Feet): 150 Feet Assistive device: Rolling walker (2 wheeled) Gait Pattern/deviations: Step-to pattern;Step-through pattern;Shuffle;Trunk flexed     General Gait Details: cues for sequence, posture and position from RW   Stairs Stairs: Yes Stairs assistance: Min assist Stair Management: One rail Right;Step to pattern;Forwards;With cane Number of Stairs: 8 General stair comments: 4 stairs twice with rail and cane; min cues for sequence and foot/cane placement.  Pt states this it how she did it prior to surgery  Wheelchair Mobility    Modified Rankin (Stroke Patients Only)       Balance                                    Cognition Arousal/Alertness: Awake/alert Behavior During Therapy: WFL for tasks assessed/performed Overall Cognitive Status: Within Functional Limits for tasks assessed                      Exercises Total Joint Exercises Ankle Circles/Pumps: AROM;Both;15 reps;Supine Quad Sets:  AROM;Both;Supine;20 reps Heel Slides: AAROM;Right;Supine;20 reps Straight Leg Raises: AAROM;AROM;Right;10 reps;Supine;20 reps Long Arc Quad: AAROM;AROM;Both;15 reps;Seated Goniometric ROM: AAROM at R knee -10 - 80    General Comments        Pertinent Vitals/Pain Pain Assessment: 0-10 Pain Score: 2  Pain Location: R knee Pain Descriptors / Indicators: Sore Pain Intervention(s): Limited activity within patient's tolerance;Ice applied;Monitored during session;Premedicated before session    Home Living                      Prior Function            PT Goals (current goals can now be found in the care plan section) Acute Rehab PT Goals Patient Stated Goal: Walk without pain PT Goal Formulation: With patient Time For Goal Achievement: 12/01/13 Potential to Achieve Goals: Good Progress towards PT goals: Progressing toward goals    Frequency  7X/week    PT Plan Current plan remains appropriate    Co-evaluation             End of Session Equipment Utilized During Treatment: Gait belt Activity Tolerance: Patient tolerated treatment well Patient left: in chair;with call bell/phone within reach     Time: 0818-0910 PT Time Calculation (min): 52 min  Charges:  $Gait Training: 8-22 mins $Therapeutic Exercise: 8-22 mins $Therapeutic Activity: 8-22 mins                    G  Codes:      Ahmir Bracken 11/27/2013, 9:40 AM

## 2013-11-30 ENCOUNTER — Telehealth: Payer: Self-pay | Admitting: Nurse Practitioner

## 2013-12-03 NOTE — Discharge Summary (Signed)
Physician Discharge Summary  Patient ID: Caitlyn Newman MRN: [id number] DOB/AGE: 1951-02-06 63 y.o.  Admit date: 11/24/2013 Discharge date: 11/27/2013   Procedures:  Procedure(s) (LRB): RIGHT TOTAL KNEE ARTHROPLASTY (Right)  Attending Physician:  Dr. Durene Romans   Admission Diagnoses:   Right knee RA / pain  Discharge Diagnoses:  Principal Problem:   S/P right TKA Active Problems:   Overweight (BMI 25.0-29.9)   Postoperative anemia due to acute blood loss  Past Medical History  Diagnosis Date  . Anemia   . Arthritis   . Wears glasses   . Pneumonia     as a baby  . Gout     HPI: Caitlyn Newman, 63 y.o. female, has a history of pain and functional disability in the right knee due to arthritis and has failed non-surgical conservative treatments for greater than 12 weeks to include NSAID's and/or analgesics, corticosteriod injections, use of assistive devices and activity modification. Onset of symptoms was gradual, starting 2+ years ago with gradually worsening course since that time. The patient noted no past surgery on the right knee(s). Patient currently rates pain in the right knee(s) at 8 out of 10 with activity. Patient has night pain, worsening of pain with activity and weight bearing, pain that interferes with activities of daily living, pain with passive range of motion, crepitus and joint swelling. Patient has evidence of periarticular osteophytes and joint space narrowing by imaging studies. There is no active infection. Risks, benefits and expectations were discussed with the patient. Risks including but not limited to the risk of anesthesia, blood clots, nerve damage, blood vessel damage, failure of the prosthesis, infection and up to and including death. Patient understand the risks, benefits and expectations and wishes to proceed with surgery.  PCP: Rudi Heap, MD   Discharged Condition: good  Hospital Course:  Patient underwent the above stated procedure  on 11/24/2013. Patient tolerated the procedure well and brought to the recovery room in good condition and subsequently to the floor.  POD #1 BP: 162/74 ; Pulse: 62 ; Temp: 98.1 F (36.7 C) ; Resp: 15  Patient reports pain as mild, pain controlled. No events throughout the night. Still planning on going home after her hospital stay. Dorsiflexion/plantar flexion intact, incision: dressing C/D/I, no cellulitis present and compartment soft.   LABS  Basename    HGB  10.3  HCT  30.6   POD #2  BP: 131/74 ; Pulse: 101 ; Temp: 97.9 F (36.6 C) ; Resp: 18 Patient reports pain as mild, pain controlled. No events throughout the night. Little N&V this morning from taking pills on an empty stomach. Dorsiflexion/plantar flexion intact, incision: dressing C/D/I, no cellulitis present and compartment soft.   LABS  Basename    HGB  10.2  HCT  31.4   POD #3  BP: 119/62 ; Pulse: 66 ; Temp: 97.9 F (36.6 C) ; Resp: 16 Patient reports pain as mild, pain controlled. No events throughout the night. Nausea has resolved. Ready to be discharged home. Dorsiflexion/plantar flexion intact, incision: dressing C/D/I, no cellulitis present and compartment soft.   LABS   No new labs   Discharge Exam: General appearance: alert, cooperative and no distress Extremities: Homans sign is negative, no sign of DVT, no edema, redness or tenderness in the calves or thighs and no ulcers, gangrene or trophic changes  Disposition:    Home with follow up in 2 weeks   Follow-up Information   Follow up with Shelda Pal, MD. Schedule an appointment  as soon as possible for a visit in 2 weeks.   Specialty:  Orthopedic Surgery   Contact information:   [address] [place] [address] [phone]       Follow up with [facility]. (home health physical therapy)    Contact information:   [address] [place] [address] [phone]       Discharge Instructions   Call  MD / Call 911    Complete by:  As directed   If you experience chest pain or shortness of breath, CALL 911 and be transported to the hospital emergency room.  If you develope a fever above 101 F, pus (white drainage) or increased drainage or redness at the wound, or calf pain, call your surgeon's office.     Change dressing    Complete by:  As directed   Maintain surgical dressing for 10-14 days, or until follow up in the clinic.     Constipation Prevention    Complete by:  As directed   Drink plenty of fluids.  Prune juice may be helpful.  You may use a stool softener, such as Colace (over the counter) 100 mg twice a day.  Use MiraLax (over the counter) for constipation as needed.     Diet - low sodium heart healthy    Complete by:  As directed      Discharge instructions    Complete by:  As directed   Maintain surgical dressing for 10-14 days, or until follow up in the clinic. Follow up in 2 weeks at [facility]. Call with any questions or concerns.     Driving restrictions    Complete by:  As directed   No driving for 4 weeks     Increase activity slowly as tolerated    Complete by:  As directed      TED hose    Complete by:  As directed   Use stockings (TED hose) for 2 weeks on both leg(s).  You may remove them at night for sleeping.     Weight bearing as tolerated    Complete by:  As directed   Laterality:  right  Extremity:  Lower             Medication List    STOP taking these medications       methotrexate 2.5 MG tablet  Commonly known as:  RHEUMATREX      TAKE these medications       aspirin 325 MG EC tablet  Take 1 tablet (325 mg total) by mouth 2 (two) times daily.     DSS 100 MG Caps  Take 100 mg by mouth 2 (two) times daily.     ferrous sulfate 325 (65 FE) MG tablet  Take 1 tablet (325 mg total) by mouth 3 (three) times daily after meals.     HYDROcodone-acetaminophen 7.5-325 MG per tablet  Commonly known as:  NORCO  Take 1-2 tablets by  mouth every 4 (four) hours as needed for moderate pain.     methocarbamol 500 MG tablet  Commonly known as:  ROBAXIN  Take 1 tablet (500 mg total) by mouth every 6 (six) hours as needed for muscle spasms.     polyethylene glycol packet  Commonly known as:  MIRALAX / GLYCOLAX  Take 17 g by mouth 2 (two) times daily.     Vitamin D 2000 UNITS Caps  Take 1 capsule by mouth daily.  Signed: Anastasio Auerbach. Edu On   PA-C  12/03/2013, 1:48 PM

## 2014-07-15 ENCOUNTER — Telehealth: Payer: Self-pay | Admitting: Nurse Practitioner

## 2014-09-21 ENCOUNTER — Telehealth: Payer: Self-pay | Admitting: Nurse Practitioner

## 2014-09-21 DIAGNOSIS — M25561 Pain in right knee: Secondary | ICD-10-CM

## 2014-09-21 NOTE — Telephone Encounter (Signed)
Ortho referral sent 

## 2014-11-02 ENCOUNTER — Telehealth: Payer: Self-pay | Admitting: Nurse Practitioner

## 2014-11-08 NOTE — Telephone Encounter (Signed)
Pt called several days ago needing referrals to several doctors. Her insurance would not go into effect until 11/08/14. I went to enter referrals and we are not her PCP, therefore we are unable to make the referrals thru [facility] at this time. If pt would like to call [facility] and have them change it where our office is her PCP, then referrals can be made.   I left message with an individual at her home to return call to referrals

## 2016-04-14 IMAGING — CR DG CHEST 2V
2 series · 2 of 2 positions shown · non-contrast
Comparison: 01/08/2013

CLINICAL DATA: Preoperative clearance.

EXAM:
CHEST  2 VIEW

[view not recorded (1 of 2)]
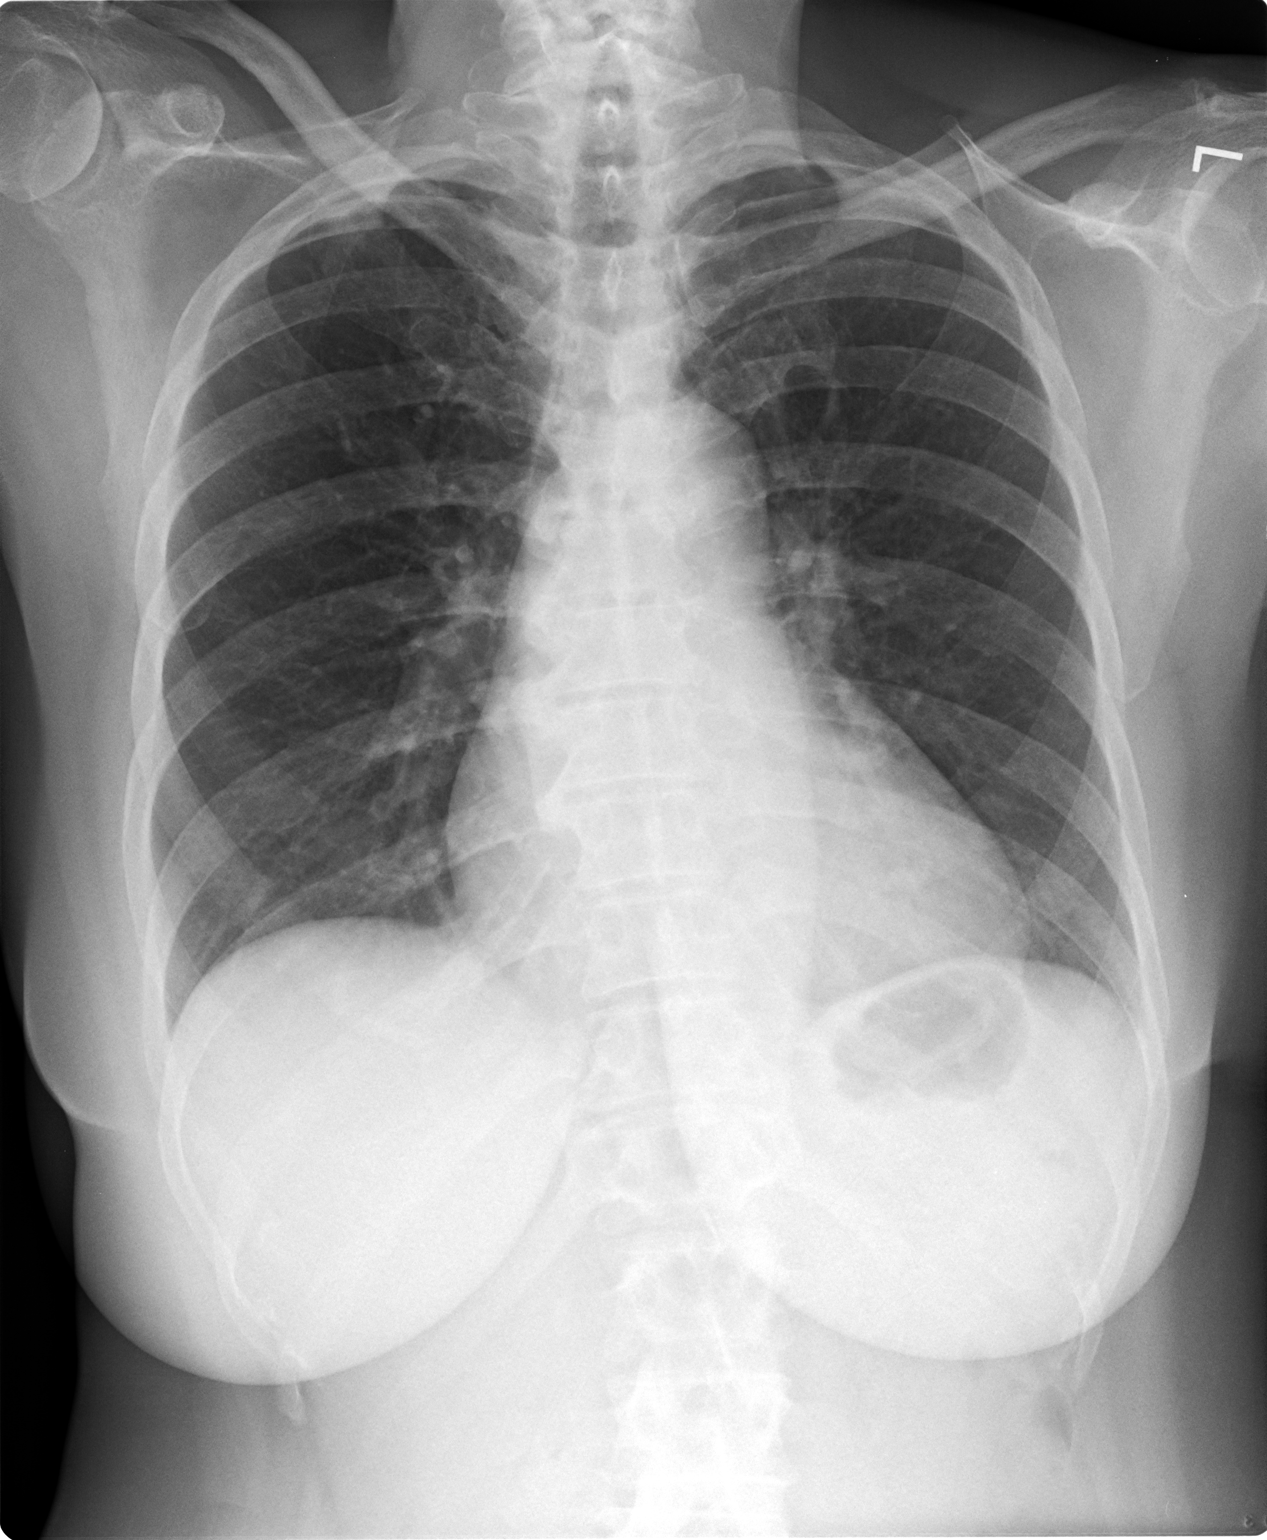

[view not recorded (2 of 2)]
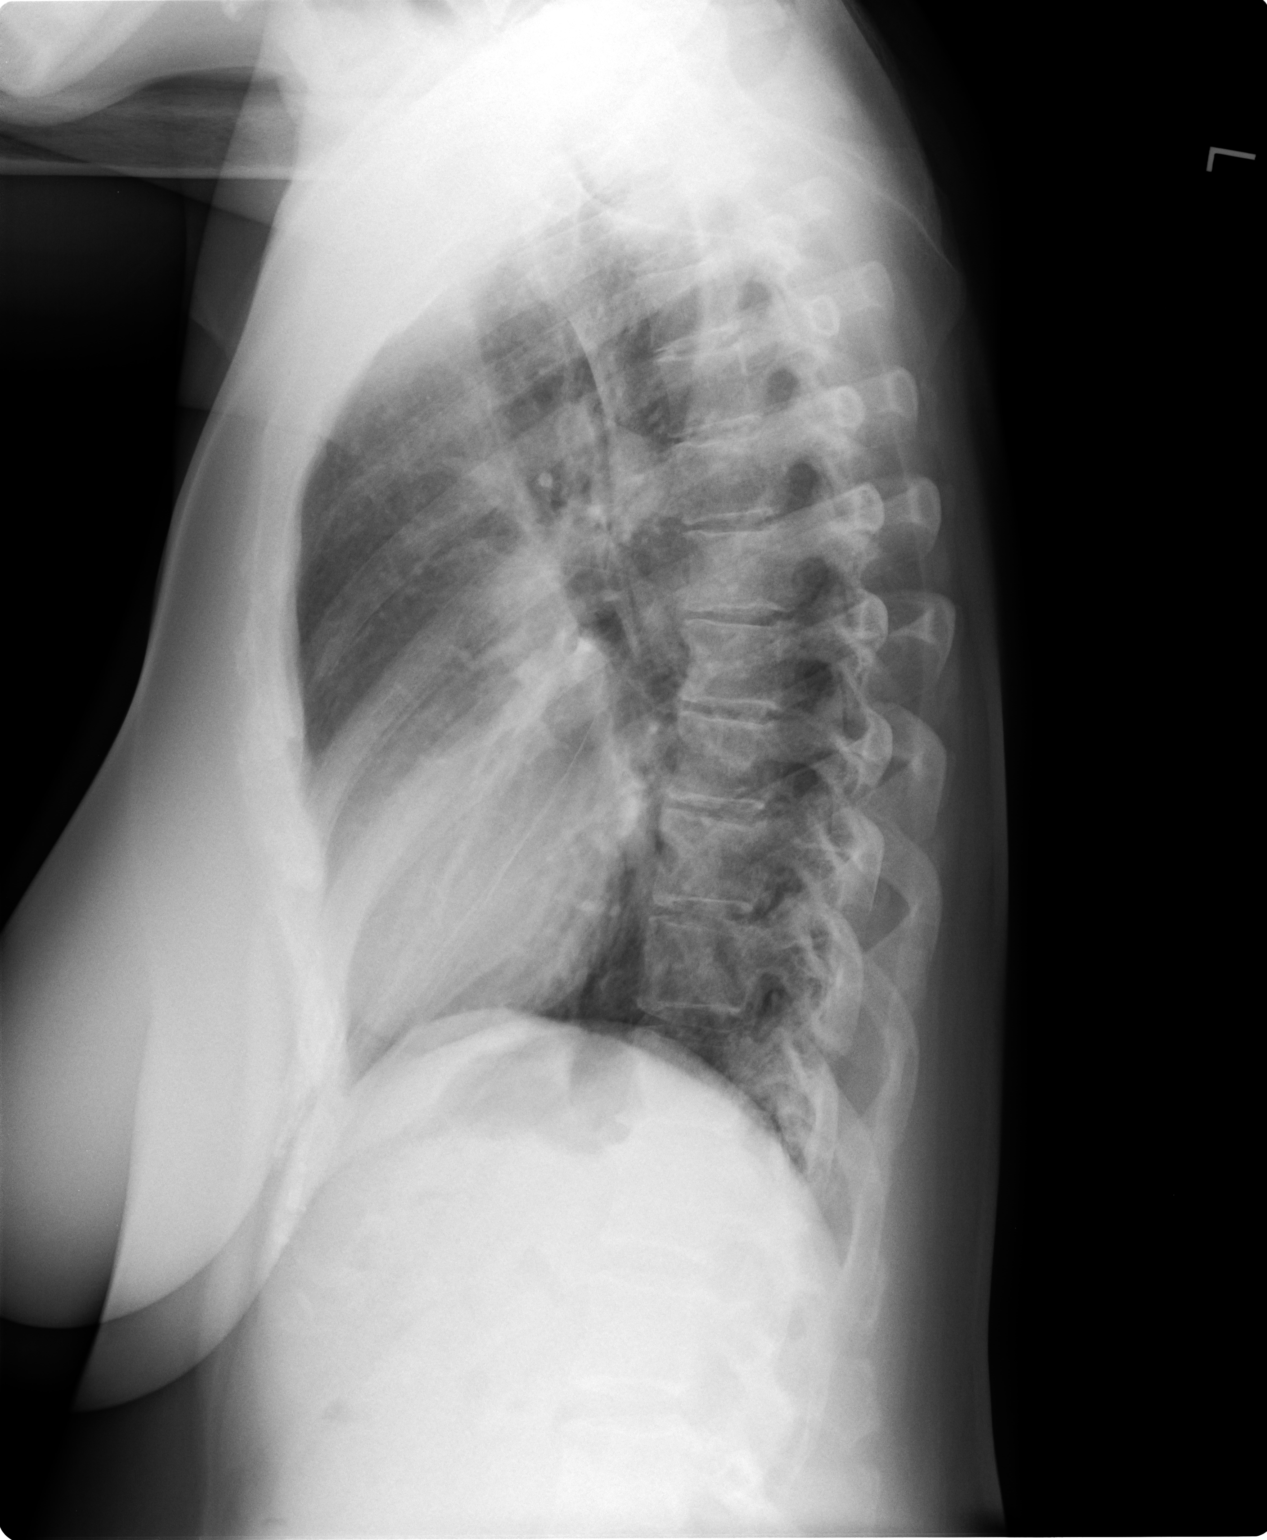

[2 of 2 positions shown; findings below may reference images not displayed]

FINDINGS: Lungs are adequately inflated without consolidation or effusion.
There are mild degenerative changes of the spine.
IMPRESSION: No active cardiopulmonary disease.

## 2017-05-29 DIAGNOSIS — Z79899 Other long term (current) drug therapy: Secondary | ICD-10-CM | POA: Diagnosis not present

## 2017-08-26 DIAGNOSIS — Z79899 Other long term (current) drug therapy: Secondary | ICD-10-CM | POA: Diagnosis not present

## 2017-08-26 DIAGNOSIS — M0609 Rheumatoid arthritis without rheumatoid factor, multiple sites: Secondary | ICD-10-CM | POA: Diagnosis not present

## 2017-08-26 DIAGNOSIS — M79604 Pain in right leg: Secondary | ICD-10-CM | POA: Diagnosis not present

## 2017-08-26 DIAGNOSIS — M1990 Unspecified osteoarthritis, unspecified site: Secondary | ICD-10-CM | POA: Diagnosis not present

## 2017-11-26 DIAGNOSIS — M1990 Unspecified osteoarthritis, unspecified site: Secondary | ICD-10-CM | POA: Diagnosis not present

## 2017-11-26 DIAGNOSIS — M25569 Pain in unspecified knee: Secondary | ICD-10-CM | POA: Diagnosis not present

## 2017-11-26 DIAGNOSIS — Z79899 Other long term (current) drug therapy: Secondary | ICD-10-CM | POA: Diagnosis not present

## 2017-11-26 DIAGNOSIS — M0609 Rheumatoid arthritis without rheumatoid factor, multiple sites: Secondary | ICD-10-CM | POA: Diagnosis not present

## 2018-03-13 DIAGNOSIS — M1990 Unspecified osteoarthritis, unspecified site: Secondary | ICD-10-CM | POA: Diagnosis not present

## 2018-03-13 DIAGNOSIS — M0609 Rheumatoid arthritis without rheumatoid factor, multiple sites: Secondary | ICD-10-CM | POA: Diagnosis not present

## 2018-03-13 DIAGNOSIS — M25569 Pain in unspecified knee: Secondary | ICD-10-CM | POA: Diagnosis not present

## 2018-03-13 DIAGNOSIS — Z79899 Other long term (current) drug therapy: Secondary | ICD-10-CM | POA: Diagnosis not present

## 2018-08-26 DIAGNOSIS — M0609 Rheumatoid arthritis without rheumatoid factor, multiple sites: Secondary | ICD-10-CM | POA: Diagnosis not present

## 2018-08-26 DIAGNOSIS — M25569 Pain in unspecified knee: Secondary | ICD-10-CM | POA: Diagnosis not present

## 2018-08-26 DIAGNOSIS — Z79899 Other long term (current) drug therapy: Secondary | ICD-10-CM | POA: Diagnosis not present

## 2018-08-26 DIAGNOSIS — M1990 Unspecified osteoarthritis, unspecified site: Secondary | ICD-10-CM | POA: Diagnosis not present

## 2018-12-25 DIAGNOSIS — M0609 Rheumatoid arthritis without rheumatoid factor, multiple sites: Secondary | ICD-10-CM | POA: Diagnosis not present

## 2018-12-25 DIAGNOSIS — Z79899 Other long term (current) drug therapy: Secondary | ICD-10-CM | POA: Diagnosis not present

## 2018-12-25 DIAGNOSIS — M1990 Unspecified osteoarthritis, unspecified site: Secondary | ICD-10-CM | POA: Diagnosis not present

## 2019-07-09 ENCOUNTER — Ambulatory Visit: Payer: Medicare HMO | Attending: Internal Medicine

## 2019-07-09 DIAGNOSIS — Z23 Encounter for immunization: Secondary | ICD-10-CM

## 2019-07-09 NOTE — Progress Notes (Signed)
   Covid-19 Vaccination Clinic  Name:  Caitlyn Newman    MRN: [id number] DOB: 31-Jan-1951  07/09/2019  [name] was observed post Covid-19 immunization for 15 minutes without incident. She was provided with Vaccine Information Sheet and instruction to access the V-Safe system.   [name] was instructed to call 911 with any severe reactions post vaccine: Marland Kitchen Difficulty breathing  . Swelling of face and throat  . A fast heartbeat  . A bad rash all over body  . Dizziness and weakness   Immunizations Administered    Name Date Dose VIS Date Route   Moderna COVID-19 Vaccine 07/09/2019 11:10 AM 0.5 mL 03/10/2019 Intramuscular   Manufacturer: Moderna   Lot: [id number]   NDC: 00762-263-33

## 2019-07-31 DIAGNOSIS — M0609 Rheumatoid arthritis without rheumatoid factor, multiple sites: Secondary | ICD-10-CM | POA: Diagnosis not present

## 2019-07-31 DIAGNOSIS — Z79899 Other long term (current) drug therapy: Secondary | ICD-10-CM | POA: Diagnosis not present

## 2019-07-31 DIAGNOSIS — M1990 Unspecified osteoarthritis, unspecified site: Secondary | ICD-10-CM | POA: Diagnosis not present

## 2019-08-12 ENCOUNTER — Ambulatory Visit: Payer: Medicare HMO | Attending: Internal Medicine

## 2019-08-12 DIAGNOSIS — Z23 Encounter for immunization: Secondary | ICD-10-CM

## 2019-08-12 NOTE — Progress Notes (Signed)
   Covid-19 Vaccination Clinic  Name:  Caitlyn Newman    MRN: [id number] DOB: 28-Mar-1951  08/12/2019  [name] was observed post Covid-19 immunization for 15 minutes without incident. She was provided with Vaccine Information Sheet and instruction to access the V-Safe system.   [name] was instructed to call 911 with any severe reactions post vaccine: Marland Kitchen Difficulty breathing  . Swelling of face and throat  . A fast heartbeat  . A bad rash all over body  . Dizziness and weakness   Immunizations Administered    Name Date Dose VIS Date Route   Moderna COVID-19 Vaccine 08/12/2019 10:49 AM 0.5 mL 03/2019 Intramuscular   Manufacturer: Moderna   Lot: [id number]   NDC: 37342-876-81

## 2019-10-30 DIAGNOSIS — Z79899 Other long term (current) drug therapy: Secondary | ICD-10-CM | POA: Diagnosis not present

## 2019-10-30 DIAGNOSIS — M0609 Rheumatoid arthritis without rheumatoid factor, multiple sites: Secondary | ICD-10-CM | POA: Diagnosis not present

## 2020-02-02 DIAGNOSIS — Z79899 Other long term (current) drug therapy: Secondary | ICD-10-CM | POA: Diagnosis not present

## 2020-02-02 DIAGNOSIS — M1990 Unspecified osteoarthritis, unspecified site: Secondary | ICD-10-CM | POA: Diagnosis not present

## 2020-02-02 DIAGNOSIS — M0609 Rheumatoid arthritis without rheumatoid factor, multiple sites: Secondary | ICD-10-CM | POA: Diagnosis not present

## 2020-05-04 DIAGNOSIS — M0609 Rheumatoid arthritis without rheumatoid factor, multiple sites: Secondary | ICD-10-CM | POA: Diagnosis not present

## 2020-08-02 DIAGNOSIS — M1990 Unspecified osteoarthritis, unspecified site: Secondary | ICD-10-CM | POA: Diagnosis not present

## 2020-08-02 DIAGNOSIS — M0609 Rheumatoid arthritis without rheumatoid factor, multiple sites: Secondary | ICD-10-CM | POA: Diagnosis not present

## 2020-08-02 DIAGNOSIS — Z79899 Other long term (current) drug therapy: Secondary | ICD-10-CM | POA: Diagnosis not present

## 2020-08-02 DIAGNOSIS — M25562 Pain in left knee: Secondary | ICD-10-CM | POA: Diagnosis not present

## 2020-11-01 DIAGNOSIS — M25562 Pain in left knee: Secondary | ICD-10-CM | POA: Diagnosis not present

## 2020-11-01 DIAGNOSIS — Z79899 Other long term (current) drug therapy: Secondary | ICD-10-CM | POA: Diagnosis not present

## 2020-11-01 DIAGNOSIS — M1990 Unspecified osteoarthritis, unspecified site: Secondary | ICD-10-CM | POA: Diagnosis not present

## 2020-11-01 DIAGNOSIS — M0609 Rheumatoid arthritis without rheumatoid factor, multiple sites: Secondary | ICD-10-CM | POA: Diagnosis not present

## 2021-02-08 DIAGNOSIS — Z79899 Other long term (current) drug therapy: Secondary | ICD-10-CM | POA: Diagnosis not present

## 2021-02-08 DIAGNOSIS — M0609 Rheumatoid arthritis without rheumatoid factor, multiple sites: Secondary | ICD-10-CM | POA: Diagnosis not present

## 2021-02-08 DIAGNOSIS — M1990 Unspecified osteoarthritis, unspecified site: Secondary | ICD-10-CM | POA: Diagnosis not present

## 2021-02-08 DIAGNOSIS — E669 Obesity, unspecified: Secondary | ICD-10-CM | POA: Diagnosis not present

## 2021-06-08 DIAGNOSIS — Z79899 Other long term (current) drug therapy: Secondary | ICD-10-CM | POA: Diagnosis not present

## 2021-06-08 DIAGNOSIS — M0609 Rheumatoid arthritis without rheumatoid factor, multiple sites: Secondary | ICD-10-CM | POA: Diagnosis not present

## 2021-06-08 DIAGNOSIS — E669 Obesity, unspecified: Secondary | ICD-10-CM | POA: Diagnosis not present

## 2021-06-08 DIAGNOSIS — M1990 Unspecified osteoarthritis, unspecified site: Secondary | ICD-10-CM | POA: Diagnosis not present

## 2021-09-07 DIAGNOSIS — Z79899 Other long term (current) drug therapy: Secondary | ICD-10-CM | POA: Diagnosis not present

## 2021-09-07 DIAGNOSIS — M0609 Rheumatoid arthritis without rheumatoid factor, multiple sites: Secondary | ICD-10-CM | POA: Diagnosis not present

## 2021-09-07 DIAGNOSIS — E669 Obesity, unspecified: Secondary | ICD-10-CM | POA: Diagnosis not present

## 2021-09-07 DIAGNOSIS — M1990 Unspecified osteoarthritis, unspecified site: Secondary | ICD-10-CM | POA: Diagnosis not present

## 2021-12-14 DIAGNOSIS — M0609 Rheumatoid arthritis without rheumatoid factor, multiple sites: Secondary | ICD-10-CM | POA: Diagnosis not present

## 2021-12-14 DIAGNOSIS — E669 Obesity, unspecified: Secondary | ICD-10-CM | POA: Diagnosis not present

## 2021-12-14 DIAGNOSIS — Z79899 Other long term (current) drug therapy: Secondary | ICD-10-CM | POA: Diagnosis not present

## 2021-12-14 DIAGNOSIS — M1990 Unspecified osteoarthritis, unspecified site: Secondary | ICD-10-CM | POA: Diagnosis not present

## 2022-03-22 DIAGNOSIS — M1990 Unspecified osteoarthritis, unspecified site: Secondary | ICD-10-CM | POA: Diagnosis not present

## 2022-03-22 DIAGNOSIS — M0609 Rheumatoid arthritis without rheumatoid factor, multiple sites: Secondary | ICD-10-CM | POA: Diagnosis not present

## 2022-03-22 DIAGNOSIS — E669 Obesity, unspecified: Secondary | ICD-10-CM | POA: Diagnosis not present

## 2022-03-22 DIAGNOSIS — Z79899 Other long term (current) drug therapy: Secondary | ICD-10-CM | POA: Diagnosis not present

## 2022-05-18 ENCOUNTER — Other Ambulatory Visit: Payer: Self-pay

## 2022-05-18 ENCOUNTER — Inpatient Hospital Stay (HOSPITAL_COMMUNITY): Payer: Medicare HMO

## 2022-05-18 ENCOUNTER — Encounter (HOSPITAL_COMMUNITY): Payer: Self-pay | Admitting: Emergency Medicine

## 2022-05-18 ENCOUNTER — Inpatient Hospital Stay (HOSPITAL_COMMUNITY)
Admission: EM | Admit: 2022-05-18 | Discharge: 2022-05-24 | DRG: 871 | Disposition: A | Discharge status: Home / Self Care | Payer: Medicare HMO | Attending: Family Medicine | Admitting: Family Medicine

## 2022-05-18 ENCOUNTER — Emergency Department (HOSPITAL_COMMUNITY): Payer: Medicare HMO

## 2022-05-18 DIAGNOSIS — K769 Liver disease, unspecified: Secondary | ICD-10-CM | POA: Diagnosis present

## 2022-05-18 DIAGNOSIS — Z6834 Body mass index (BMI) 34.0-34.9, adult: Secondary | ICD-10-CM | POA: Diagnosis not present

## 2022-05-18 DIAGNOSIS — Z8701 Personal history of pneumonia (recurrent): Secondary | ICD-10-CM

## 2022-05-18 DIAGNOSIS — M069 Rheumatoid arthritis, unspecified: Secondary | ICD-10-CM | POA: Diagnosis present

## 2022-05-18 DIAGNOSIS — J9601 Acute respiratory failure with hypoxia: Secondary | ICD-10-CM

## 2022-05-18 DIAGNOSIS — R0902 Hypoxemia: Secondary | ICD-10-CM

## 2022-05-18 DIAGNOSIS — I3139 Other pericardial effusion (noninflammatory): Secondary | ICD-10-CM

## 2022-05-18 DIAGNOSIS — A419 Sepsis, unspecified organism: Principal | ICD-10-CM | POA: Diagnosis present

## 2022-05-18 DIAGNOSIS — Z888 Allergy status to other drugs, medicaments and biological substances status: Secondary | ICD-10-CM

## 2022-05-18 DIAGNOSIS — E119 Type 2 diabetes mellitus without complications: Secondary | ICD-10-CM | POA: Diagnosis not present

## 2022-05-18 DIAGNOSIS — J9811 Atelectasis: Secondary | ICD-10-CM | POA: Diagnosis not present

## 2022-05-18 DIAGNOSIS — J111 Influenza due to unidentified influenza virus with other respiratory manifestations: Secondary | ICD-10-CM

## 2022-05-18 DIAGNOSIS — E278 Other specified disorders of adrenal gland: Secondary | ICD-10-CM | POA: Diagnosis present

## 2022-05-18 DIAGNOSIS — E1165 Type 2 diabetes mellitus with hyperglycemia: Secondary | ICD-10-CM | POA: Diagnosis not present

## 2022-05-18 DIAGNOSIS — E669 Obesity, unspecified: Secondary | ICD-10-CM | POA: Diagnosis not present

## 2022-05-18 DIAGNOSIS — Z1152 Encounter for screening for COVID-19: Secondary | ICD-10-CM | POA: Diagnosis not present

## 2022-05-18 DIAGNOSIS — Z8249 Family history of ischemic heart disease and other diseases of the circulatory system: Secondary | ICD-10-CM

## 2022-05-18 DIAGNOSIS — Z880 Allergy status to penicillin: Secondary | ICD-10-CM

## 2022-05-18 DIAGNOSIS — Z833 Family history of diabetes mellitus: Secondary | ICD-10-CM

## 2022-05-18 DIAGNOSIS — J90 Pleural effusion, not elsewhere classified: Secondary | ICD-10-CM | POA: Diagnosis not present

## 2022-05-18 DIAGNOSIS — M109 Gout, unspecified: Secondary | ICD-10-CM | POA: Diagnosis present

## 2022-05-18 DIAGNOSIS — Z96651 Presence of right artificial knee joint: Secondary | ICD-10-CM | POA: Diagnosis present

## 2022-05-18 DIAGNOSIS — I4892 Unspecified atrial flutter: Secondary | ICD-10-CM | POA: Diagnosis present

## 2022-05-18 DIAGNOSIS — J189 Pneumonia, unspecified organism: Secondary | ICD-10-CM | POA: Diagnosis present

## 2022-05-18 DIAGNOSIS — R6510 Systemic inflammatory response syndrome (SIRS) of non-infectious origin without acute organ dysfunction: Secondary | ICD-10-CM | POA: Diagnosis not present

## 2022-05-18 DIAGNOSIS — R197 Diarrhea, unspecified: Secondary | ICD-10-CM | POA: Diagnosis present

## 2022-05-18 DIAGNOSIS — J918 Pleural effusion in other conditions classified elsewhere: Secondary | ICD-10-CM | POA: Diagnosis present

## 2022-05-18 DIAGNOSIS — Z79631 Long term (current) use of antimetabolite agent: Secondary | ICD-10-CM | POA: Diagnosis not present

## 2022-05-18 DIAGNOSIS — I308 Other forms of acute pericarditis: Secondary | ICD-10-CM | POA: Diagnosis not present

## 2022-05-18 DIAGNOSIS — I110 Hypertensive heart disease with heart failure: Secondary | ICD-10-CM | POA: Diagnosis present

## 2022-05-18 DIAGNOSIS — I5033 Acute on chronic diastolic (congestive) heart failure: Secondary | ICD-10-CM | POA: Diagnosis present

## 2022-05-18 DIAGNOSIS — I309 Acute pericarditis, unspecified: Secondary | ICD-10-CM

## 2022-05-18 DIAGNOSIS — Z79899 Other long term (current) drug therapy: Secondary | ICD-10-CM

## 2022-05-18 DIAGNOSIS — N179 Acute kidney failure, unspecified: Secondary | ICD-10-CM | POA: Diagnosis not present

## 2022-05-18 DIAGNOSIS — I301 Infective pericarditis: Secondary | ICD-10-CM | POA: Diagnosis not present

## 2022-05-18 DIAGNOSIS — I5031 Acute diastolic (congestive) heart failure: Secondary | ICD-10-CM | POA: Diagnosis not present

## 2022-05-18 DIAGNOSIS — R0602 Shortness of breath: Secondary | ICD-10-CM | POA: Diagnosis not present

## 2022-05-18 DIAGNOSIS — I319 Disease of pericardium, unspecified: Secondary | ICD-10-CM | POA: Diagnosis present

## 2022-05-18 DIAGNOSIS — I300 Acute nonspecific idiopathic pericarditis: Secondary | ICD-10-CM | POA: Diagnosis not present

## 2022-05-18 DIAGNOSIS — R918 Other nonspecific abnormal finding of lung field: Secondary | ICD-10-CM | POA: Diagnosis not present

## 2022-05-18 LAB — CBC WITH DIFFERENTIAL/PLATELET
Abs Immature Granulocytes: 0.17 10*3/uL — ABNORMAL HIGH (ref 0.00–0.07)
Basophils Absolute: 0 10*3/uL (ref 0.0–0.1)
Basophils Relative: 0 %
Eosinophils Absolute: 0 10*3/uL (ref 0.0–0.5)
Eosinophils Relative: 0 %
HCT: 38.2 % (ref 36.0–46.0)
Hemoglobin: 12.4 g/dL (ref 12.0–15.0)
Immature Granulocytes: 1 %
Lymphocytes Relative: 3 %
Lymphs Abs: 0.6 10*3/uL — ABNORMAL LOW (ref 0.7–4.0)
MCH: 31.2 pg (ref 26.0–34.0)
MCHC: 32.5 g/dL (ref 30.0–36.0)
MCV: 96.2 fL (ref 80.0–100.0)
Monocytes Absolute: 0.5 10*3/uL (ref 0.1–1.0)
Monocytes Relative: 2 %
Neutro Abs: 21.2 10*3/uL — ABNORMAL HIGH (ref 1.7–7.7)
Neutrophils Relative %: 94 %
Platelets: 294 10*3/uL (ref 150–400)
RBC: 3.97 MIL/uL (ref 3.87–5.11)
RDW: 14 % (ref 11.5–15.5)
WBC: 22.6 10*3/uL — ABNORMAL HIGH (ref 4.0–10.5)
nRBC: 0 % (ref 0.0–0.2)

## 2022-05-18 LAB — HEMOGLOBIN A1C
Hgb A1c MFr Bld: 5.9 % — ABNORMAL HIGH (ref 4.8–5.6)
Mean Plasma Glucose: 122.63 mg/dL

## 2022-05-18 LAB — PROCALCITONIN: Procalcitonin: 0.62 ng/mL

## 2022-05-18 LAB — BASIC METABOLIC PANEL
Anion gap: 11 (ref 5–15)
BUN: 16 mg/dL (ref 8–23)
CO2: 24 mmol/L (ref 22–32)
Calcium: 8.4 mg/dL — ABNORMAL LOW (ref 8.9–10.3)
Chloride: 99 mmol/L (ref 98–111)
Creatinine, Ser: 1.07 mg/dL — ABNORMAL HIGH (ref 0.44–1.00)
GFR, Estimated: 56 mL/min — ABNORMAL LOW (ref >60)
Glucose, Bld: 355 mg/dL — ABNORMAL HIGH (ref 70–99)
Potassium: 4.3 mmol/L (ref 3.5–5.1)
Sodium: 134 mmol/L — ABNORMAL LOW (ref 135–145)

## 2022-05-18 LAB — LACTIC ACID, PLASMA
Lactic Acid, Venous: 2.7 mmol/L (ref 0.5–1.9)
Lactic Acid, Venous: 1.8 mmol/L (ref 0.5–1.9)

## 2022-05-18 LAB — CBG MONITORING, ED
Glucose-Capillary: 223 mg/dL — ABNORMAL HIGH (ref 70–99)
Glucose-Capillary: 119 mg/dL — ABNORMAL HIGH (ref 70–99)
Glucose-Capillary: 106 mg/dL — ABNORMAL HIGH (ref 70–99)

## 2022-05-18 LAB — BLOOD GAS, VENOUS
Acid-Base Excess: 4.5 mmol/L — ABNORMAL HIGH (ref 0.0–2.0)
Bicarbonate: 28.4 mmol/L — ABNORMAL HIGH (ref 20.0–28.0)
Drawn by: 27160
O2 Saturation: 80 %
Patient temperature: 36.6
pCO2, Ven: 38 mmHg — ABNORMAL LOW (ref 44–60)
pH, Ven: 7.48 — ABNORMAL HIGH (ref 7.25–7.43)
pO2, Ven: 47 mmHg — ABNORMAL HIGH (ref 32–45)

## 2022-05-18 LAB — GLUCOSE, CAPILLARY: Glucose-Capillary: 140 mg/dL — ABNORMAL HIGH (ref 70–99)

## 2022-05-18 LAB — ECHOCARDIOGRAM COMPLETE
Height: 64 in
S' Lateral: 2.4 cm
Weight: 3200 oz

## 2022-05-18 LAB — SEDIMENTATION RATE: Sed Rate: 40 mm/hr — ABNORMAL HIGH (ref 0–22)

## 2022-05-18 LAB — RESP PANEL BY RT-PCR (RSV, FLU A&B, COVID)  RVPGX2
Influenza A by PCR: NEGATIVE
Influenza B by PCR: NEGATIVE
Resp Syncytial Virus by PCR: NEGATIVE
SARS Coronavirus 2 by RT PCR: NEGATIVE

## 2022-05-18 LAB — HIV ANTIBODY (ROUTINE TESTING W REFLEX): HIV Screen 4th Generation wRfx: NONREACTIVE

## 2022-05-18 LAB — C-REACTIVE PROTEIN: CRP: 19.7 mg/dL — ABNORMAL HIGH (ref <1.0)

## 2022-05-18 LAB — BRAIN NATRIURETIC PEPTIDE: B Natriuretic Peptide: 196 pg/mL — ABNORMAL HIGH (ref 0.0–100.0)

## 2022-05-18 LAB — BETA-HYDROXYBUTYRIC ACID: Beta-Hydroxybutyric Acid: 0.09 mmol/L (ref 0.05–0.27)

## 2022-05-18 MED ORDER — ACETAMINOPHEN 325 MG PO TABS: 650.0000 mg | ORAL_TABLET | Freq: Four times a day (QID) | ORAL | Status: DC | PRN

## 2022-05-18 MED ORDER — INSULIN GLARGINE-YFGN 100 UNIT/ML ~~LOC~~ SOLN
10.0000 [IU] | Freq: Every day | SUBCUTANEOUS | Status: DC
  Administered 2022-05-18 – 2022-05-19 (×2): 10 [IU] via SUBCUTANEOUS
  Filled 2022-05-18 (×4): qty 0.1

## 2022-05-18 MED ORDER — SODIUM CHLORIDE 0.9 % IV SOLN
500.0000 mg | INTRAVENOUS | Status: DC
  Administered 2022-05-19 – 2022-05-22 (×4): 500 mg via INTRAVENOUS
  Filled 2022-05-18 (×4): qty 5

## 2022-05-18 MED ORDER — INSULIN ASPART 100 UNIT/ML IJ SOLN
0.0000 [IU] | Freq: Three times a day (TID) | INTRAMUSCULAR | Status: DC
  Administered 2022-05-19: 3 [IU] via SUBCUTANEOUS
  Administered 2022-05-19 – 2022-05-20 (×2): 2 [IU] via SUBCUTANEOUS

## 2022-05-18 MED ORDER — COLCHICINE 0.6 MG PO TABS
0.6000 mg | ORAL_TABLET | Freq: Two times a day (BID) | ORAL | Status: DC
  Administered 2022-05-18 – 2022-05-24 (×12): 0.6 mg via ORAL
  Filled 2022-05-18 (×12): qty 1

## 2022-05-18 MED ORDER — ACETAMINOPHEN 650 MG RE SUPP: 650.0000 mg | Freq: Four times a day (QID) | RECTAL | Status: DC | PRN

## 2022-05-18 MED ORDER — ENOXAPARIN SODIUM 40 MG/0.4ML IJ SOSY
40.0000 mg | PREFILLED_SYRINGE | INTRAMUSCULAR | Status: DC
  Administered 2022-05-18 – 2022-05-20 (×3): 40 mg via SUBCUTANEOUS
  Filled 2022-05-18 (×4): qty 0.4

## 2022-05-18 MED ORDER — ALBUTEROL SULFATE HFA 108 (90 BASE) MCG/ACT IN AERS: 2.0000 | INHALATION_SPRAY | RESPIRATORY_TRACT | Status: DC | PRN

## 2022-05-18 MED ORDER — IOHEXOL 350 MG/ML SOLN: 75.0000 mL | Freq: Once | INTRAVENOUS | Status: DC | PRN

## 2022-05-18 MED ORDER — FERROUS SULFATE 325 (65 FE) MG PO TABS
325.0000 mg | ORAL_TABLET | Freq: Three times a day (TID) | ORAL | Status: DC
  Administered 2022-05-18 – 2022-05-24 (×16): 325 mg via ORAL
  Filled 2022-05-18 (×17): qty 1

## 2022-05-18 MED ORDER — SODIUM CHLORIDE 0.9 % IV SOLN
1.0000 g | Freq: Once | INTRAVENOUS | Status: AC
  Administered 2022-05-18: 1 g via INTRAVENOUS
  Filled 2022-05-18: qty 10

## 2022-05-18 MED ORDER — PANTOPRAZOLE SODIUM 40 MG PO TBEC
40.0000 mg | DELAYED_RELEASE_TABLET | Freq: Every day | ORAL | Status: DC
  Administered 2022-05-19 – 2022-05-24 (×6): 40 mg via ORAL
  Filled 2022-05-18 (×6): qty 1

## 2022-05-18 MED ORDER — SODIUM CHLORIDE 0.9 % IV SOLN
1.0000 g | INTRAVENOUS | Status: AC
  Administered 2022-05-19 – 2022-05-24 (×6): 1 g via INTRAVENOUS
  Filled 2022-05-18 (×6): qty 10

## 2022-05-18 MED ORDER — SODIUM CHLORIDE 0.9 % IV SOLN
500.0000 mg | Freq: Once | INTRAVENOUS | Status: AC
  Administered 2022-05-18: 500 mg via INTRAVENOUS
  Filled 2022-05-18: qty 5

## 2022-05-18 MED ORDER — ASPIRIN 325 MG PO TABS
1000.0000 mg | ORAL_TABLET | Freq: Three times a day (TID) | ORAL | Status: DC
  Administered 2022-05-18 – 2022-05-20 (×7): 975 mg via ORAL
  Filled 2022-05-18 (×7): qty 3

## 2022-05-18 MED ORDER — VITAMIN D 25 MCG (1000 UNIT) PO TABS
2000.0000 [IU] | ORAL_TABLET | Freq: Every day | ORAL | Status: DC
  Administered 2022-05-19 – 2022-05-24 (×6): 2000 [IU] via ORAL
  Filled 2022-05-18 (×6): qty 2

## 2022-05-18 MED ORDER — PERFLUTREN LIPID MICROSPHERE
1.0000 mL | INTRAVENOUS | Status: AC | PRN
  Administered 2022-05-18: 2 mL via INTRAVENOUS

## 2022-05-18 MED ORDER — FOLIC ACID 1 MG PO TABS
1.0000 mg | ORAL_TABLET | Freq: Every day | ORAL | Status: DC
  Administered 2022-05-19 – 2022-05-24 (×6): 1 mg via ORAL
  Filled 2022-05-18 (×6): qty 1

## 2022-05-18 MED ORDER — ONDANSETRON HCL 4 MG PO TABS: 4.0000 mg | ORAL_TABLET | Freq: Four times a day (QID) | ORAL | Status: DC | PRN

## 2022-05-18 MED ORDER — INSULIN ASPART 100 UNIT/ML IJ SOLN: 0.0000 [IU] | Freq: Every day | INTRAMUSCULAR | Status: DC

## 2022-05-18 MED ORDER — ONDANSETRON HCL 4 MG/2ML IJ SOLN: 4.0000 mg | Freq: Four times a day (QID) | INTRAMUSCULAR | Status: DC | PRN

## 2022-05-18 MED ORDER — SODIUM CHLORIDE 0.9 % IV SOLN: INTRAVENOUS | Status: DC

## 2022-05-18 MED ORDER — INSULIN ASPART 100 UNIT/ML IJ SOLN
10.0000 [IU] | Freq: Once | INTRAMUSCULAR | Status: AC
  Administered 2022-05-18: 10 [IU] via INTRAVENOUS
  Filled 2022-05-18: qty 1

## 2022-05-18 MED ORDER — IOHEXOL 300 MG/ML  SOLN
75.0000 mL | Freq: Once | INTRAMUSCULAR | Status: AC | PRN
  Administered 2022-05-18: 75 mL via INTRAVENOUS

## 2022-05-18 MED ORDER — SODIUM CHLORIDE 0.9 % IV BOLUS
500.0000 mL | Freq: Once | INTRAVENOUS | Status: AC
  Administered 2022-05-18: 500 mL via INTRAVENOUS

## 2022-05-18 MED ORDER — LIVING WELL WITH DIABETES BOOK
Freq: Once | Status: AC
  Filled 2022-05-18 (×2): qty 1

## 2022-05-18 MED ORDER — ALBUTEROL SULFATE (2.5 MG/3ML) 0.083% IN NEBU: 2.5000 mg | INHALATION_SOLUTION | RESPIRATORY_TRACT | Status: DC | PRN

## 2022-05-18 NOTE — Consult Note (Signed)
CARDIOLOGY CONSULT NOTE    Patient ID: Caitlyn Newman; [id number]; [birth year]   Admit date: 05/18/2022 Date of Consult: 05/18/2022  Primary Care Provider: Chevis Pretty, [place] Primary Cardiologist: None Primary Electrophysiologist: None   Patient Profile:   Caitlyn Newman is a 72 y.o. female with a hx of rheumatoid arthritis who is being seen today for the evaluation of pericardial effusion at the request of Dr. Carles Collet.  History of Present Illness:   Caitlyn Newman is a 72 year old F known to have rheumatoid arthritis on methotrexate presented to the ER with a 1 week history of fever, chills, sweating, chest discomfort and SOB. Patient reported that she was in her usual state of health until 1 week ago when she noticed sudden onset of shortness of breath with ADLs. Also has chest pain while lying down but gets better while sitting up. She presented to the ER today for further evaluation. CT imaging of chest showed moderate to large medical effusion, moderate left pleural effusion which may be loculated, moderate to extensive lingular and left lower lobe atelectasis with superimposed infection/pneumonia and 1.7 cm hyperdense/enhancing lesion within the posterior right liver and 2 cm left adrenal nodule for which elective MRI of the abdomen with and without contrast was recommended for further evaluation.  Vitals remarkable for sinus tachycardia with HR ranging from 102 to 123, RR 21-37, BP 128-158 mm Hg SBP and SpO2 91 to 96% on room air.  EKG showed sinus tachycardia with PACs, subtle ST elevations in inferior leads and lateral leads which did not meet STEMI criteria  Past Medical History:  Diagnosis Date   Anemia    Arthritis    Gout    Pneumonia    as a baby   Wears glasses     Past Surgical History:  Procedure Laterality Date   COLONOSCOPY W/ POLYPECTOMY     LYMPH NODE BIOPSY Right 01/13/2013   Procedure: LYMPH NODE BIOPSY RIGHT GROIN;  Surgeon: Joyice Faster. Cornett, MD;   Location: [facility];  Service: General;  Laterality: Right;   NO PAST SURGERIES     TOTAL KNEE ARTHROPLASTY Right 11/24/2013   Procedure: RIGHT TOTAL KNEE ARTHROPLASTY;  Surgeon: Mauri Pole, MD;  Location: [facility];  Service: Orthopedics;  Laterality: Right;       Inpatient Medications: Scheduled Meds:  insulin aspart  0-15 Units Subcutaneous TID WC   insulin aspart  0-5 Units Subcutaneous QHS   living well with diabetes book   Does not apply Once   Continuous Infusions:  sodium chloride 100 mL/hr at 05/18/22 1249   PRN Meds: albuterol, perflutren lipid microspheres (DEFINITY) IV suspension  Allergies:    Allergies  Allergen Reactions   Cortisone     Other reaction(s): Rash   Penicillins     Other reaction(s): Other Unknown    Social History:   Social History   Socioeconomic History   Marital status: Single    Spouse name: Not on file   Number of children: Not on file   Years of education: Not on file   Highest education level: Not on file  Occupational History   Not on file  Tobacco Use   Smoking status: Never   Smokeless tobacco: Never  Substance and Sexual Activity   Alcohol use: No   Drug use: No   Sexual activity: Not on file  Other Topics Concern   Not on file  Social History Narrative   Not on file   Social Determinants of  Health   Financial Resource Strain: Not on file  Food Insecurity: Not on file  Transportation Needs: Not on file  Physical Activity: Not on file  Stress: Not on file  Social Connections: Not on file  Intimate Partner Violence: Not on file    Family History:    Family History  Problem Relation Age of Onset   Diabetes Mother    Kidney disease Mother    Hypertension Mother    Hypertension Father    Diabetes Father      ROS:  Please see the history of present illness.  ROS  All other ROS reviewed and negative.     Physical Exam/Data:   Vitals:   05/18/22 1230 05/18/22 1300 05/18/22 1330 05/18/22  1400  BP: (!) 155/74 129/83 (!) 146/88 (!) 156/81  Pulse: (!) 102 (!) 106 (!) 104 (!) 110  Resp:  (!) 21    Temp:      TempSrc:      SpO2: 95% 96% 96% 96%  Weight:      Height:        Intake/Output Summary (Last 24 hours) at 05/18/2022 1653 Last data filed at 05/18/2022 1249 Gross per 24 hour  Intake 600 ml  Output --  Net 600 ml   Filed Weights   05/18/22 0752  Weight: 90.7 kg   Body mass index is 34.33 kg/m.  General:  Well nourished, well developed, in no acute distres HEENT: normal Lymph: no adenopathy Neck: no JVD Endocrine:  No thryomegaly Vascular: No carotid bruits; FA pulses 2+ bilaterally without bruits  Cardiac:  normal S1, S2; RRR; no murmur  Lungs:  clear to auscultation bilaterally, no wheezing, rhonchi or rales  Abd: soft, nontender, no hepatomegaly  Ext: no edema Musculoskeletal:  No deformities, BUE and BLE strength normal and equal Skin: warm and dry  Neuro:  CNs 2-12 intact, no focal abnormalities noted Psych:  Normal affect   EKG:  The EKG was personally reviewed and demonstrates:   Telemetry:  Telemetry was personally reviewed and demonstrates:   Relevant CV Studies:   Laboratory Data:  Chemistry Recent Labs  Lab 05/18/22 0811  NA 134*  K 4.3  CL 99  CO2 24  GLUCOSE 355*  BUN 16  CREATININE 1.07*  CALCIUM 8.4*  GFRNONAA 56*  ANIONGAP 11    No results for input(s): "PROT", "ALBUMIN", "AST", "ALT", "ALKPHOS", "BILITOT" in the last 168 hours. Hematology Recent Labs  Lab 05/18/22 0811  WBC 22.6*  RBC 3.97  HGB 12.4  HCT 38.2  MCV 96.2  MCH 31.2  MCHC 32.5  RDW 14.0  PLT 294   Cardiac EnzymesNo results for input(s): "TROPONINI" in the last 168 hours. No results for input(s): "TROPIPOC" in the last 168 hours.  BNPNo results for input(s): "BNP", "PROBNP" in the last 168 hours.  DDimer No results for input(s): "DDIMER" in the last 168 hours.  Radiology/Studies:  ECHOCARDIOGRAM COMPLETE  Result Date: 05/18/2022     ECHOCARDIOGRAM REPORT   Patient Name:   Caitlyn Newman Date of Exam: 05/18/2022 Medical Rec #:  [id number]        Height:       64.0 in Accession #:    [id number]       Weight:       200.0 lb Date of Birth:  25-Dec-1950       BSA:          1.956 m Patient Age:    72 years  BP:           153/83 mmHg Patient Gender: F                HR:           100 bpm. Exam Location:  [facility] Procedure: 2D Echo, Color Doppler, Cardiac Doppler and Intracardiac            Opacification Agent STAT ECHO Indications:    Pericardial effusion  History:        Patient has no prior history of Echocardiogram examinations.                 Signs/Symptoms:Shortness of Breath, Fever and Chest Pain; Risk                 Factors:Hypertension.  Sonographer:    Eartha Inch Referring Phys: [phone] DAVID TAT  Sonographer Comments: Technically difficult study due to poor echo windows and patient is obese. Image acquisition challenging due to patient body habitus and Image acquisition challenging due to respiratory motion. IMPRESSIONS  1. Poor echo windows  2. Moderate pericardial effusion. The pericardial effusion is lateral to the left ventricle and anterior to the right ventricle. Moderate pleural effusion in the left lateral region. Right atrium and right ventricle not well visualized to definitely rule out cardiac tampoande. IVC not well visualized.  3. Challenging study due to tachycardia. Left ventricular ejection fraction, by estimation, is 55 to 60%. The left ventricle has normal function. The left ventricle has no regional wall motion abnormalities. Left ventricular diastolic function could not  be evaluated.  4. Right ventricular systolic function was not well visualized. The right ventricular size is not well visualized. Tricuspid regurgitation signal is inadequate for assessing PA pressure.  5. The mitral valve is normal in structure. No evidence of mitral valve regurgitation. No evidence of mitral stenosis.  6. The aortic valve  is tricuspid. Aortic valve regurgitation is not visualized. No aortic stenosis is present. Comparison(s): No prior Echocardiogram. FINDINGS  Left Ventricle: Left ventricular ejection fraction, by estimation, is 55 to 60%. The left ventricle has normal function. The left ventricle has no regional wall motion abnormalities. Definity contrast agent was given IV to delineate the left ventricular  endocardial borders. The left ventricular internal cavity size was normal in size. There is no left ventricular hypertrophy. Left ventricular diastolic function could not be evaluated. Right Ventricle: The right ventricular size is not well visualized. Right vetricular wall thickness was not well visualized. Right ventricular systolic function was not well visualized. Tricuspid regurgitation signal is inadequate for assessing PA pressure. Left Atrium: Left atrial size was normal in size. Right Atrium: Right atrial size was normal in size. Pericardium: A moderately sized pericardial effusion is present. The pericardial effusion is lateral to the left ventricle and anterior to the right ventricle. Mitral Valve: The mitral valve is normal in structure. No evidence of mitral valve regurgitation. No evidence of mitral valve stenosis. Tricuspid Valve: The tricuspid valve is not well visualized. Tricuspid valve regurgitation is not demonstrated. No evidence of tricuspid stenosis. Aortic Valve: The aortic valve is tricuspid. Aortic valve regurgitation is not visualized. No aortic stenosis is present. Pulmonic Valve: The pulmonic valve was not well visualized. Pulmonic valve regurgitation is not visualized. No evidence of pulmonic stenosis. Aorta: The aortic root and ascending aorta are structurally normal, with no evidence of dilitation. Venous: The inferior vena cava was not well visualized. IAS/Shunts: No atrial level shunt detected by color flow Doppler. Additional  Comments: There is a moderate pleural effusion in the left lateral  region.  LEFT VENTRICLE PLAX 2D LVIDd:         3.00 cm LVIDs:         2.40 cm LV PW:         1.10 cm LV IVS:        0.90 cm LVOT diam:     2.00 cm LVOT Area:     3.14 cm  LEFT ATRIUM             Index        RIGHT ATRIUM           Index LA diam:        3.00 cm 1.53 cm/m   RA Area:     18.20 cm LA Vol (A2C):   59.7 ml 30.52 ml/m  RA Volume:   44.60 ml  22.80 ml/m LA Vol (A4C):   49.2 ml 25.15 ml/m LA Biplane Vol: 54.4 ml 27.81 ml/m   AORTA Ao Root diam: 3.20 cm Ao Asc diam:  3.00 cm  SHUNTS Systemic Diam: 2.00 cm Caitlyn Newman Electronically signed by Caitlyn Newman Signature Date/Time: 05/18/2022/4:52:38 PM    Final    CT Chest W Contrast  Result Date: 05/18/2022 CLINICAL DATA:  72 year old female with shortness of breath, cough and chills. EXAM: CT CHEST WITH CONTRAST TECHNIQUE: Multidetector CT imaging of the chest was performed during intravenous contrast administration. RADIATION DOSE REDUCTION: This exam was performed according to the departmental dose-optimization program which includes automated exposure control, adjustment of the mA and/or kV according to patient size and/or use of iterative reconstruction technique. CONTRAST:  61m OMNIPAQUE IOHEXOL 300 MG/ML  SOLN COMPARISON:  05/18/2022 and prior chest radiographs FINDINGS: Cardiovascular: A moderate to large pericardial effusion is identified with possible mild pericardial enhancement. UPPER limits normal heart size identified. There is no evidence of thoracic aortic aneurysm. Mediastinum/Nodes: No enlarged mediastinal, hilar, or axillary lymph nodes. Thyroid gland, trachea, and esophagus demonstrate no significant findings. Lungs/Pleura: A moderate LEFT pleural effusion is noted which may be loculated. A small RIGHT pleural effusion is identified. Moderate to extensive lingular and LEFT LOWER lobe atelectasis noted. Moderate RIGHT basilar atelectasis is identified. There is no evidence of pneumothorax. Upper Abdomen: An  indeterminate 1.7 cm hyperdense/enhancing lesion within the posterior RIGHT liver (image 130: Series 2) is noted. An indeterminate 2 cm LEFT adrenal nodule is identified. Musculoskeletal: No acute or suspicious bony abnormalities are noted. Moderate facet arthropathy in the LOWER thoracic and UPPER lumbar spine identified. IMPRESSION: 1. Moderate to large pericardial effusion with possible mild pericardial enhancement, suggesting exudative process. 2. Moderate LEFT pleural effusion which may be loculated. Small RIGHT pleural effusion. 3. Moderate to extensive lingular and LEFT LOWER lobe atelectasis. Moderate RIGHT basilar atelectasis. Superimposed infection/pneumonia are not excluded in these areas. 4. Indeterminate 1.7 cm hyperdense/enhancing lesion within the posterior RIGHT liver and indeterminate 2 cm LEFT adrenal nodule. Elective MRI of the abdomen with and without contrast recommended for further evaluation. Critical Value/emergent results were called by telephone at the time of interpretation on 05/18/2022 at 11:36 am to provider SFredia Sorrow, who verbally acknowledged these results. Electronically Signed   By: JMargarette CanadaM.D.   On: 05/18/2022 11:40   DG Chest Port 1 View  Result Date: 05/18/2022 CLINICAL DATA:  Shortness of breath EXAM: PORTABLE CHEST 1 VIEW COMPARISON:  10/28/2013 FINDINGS: Overall lower lung volumes with bibasilar atelectasis. Heart is enlarged. Left pleural effusion  noted with left lower lung collapse/consolidation. Pneumonia not excluded. No pneumothorax. Degenerative changes throughout the spine. IMPRESSION: 1. Lower lung volumes with bibasilar atelectasis. 2. Left pleural effusion and left lower lung collapse/consolidation. Electronically Signed   By: Jerilynn Mages.  Shick M.D.   On: 05/18/2022 08:13    Assessment and Plan:   Patient is a 72 year old F known to have rheumatoid arthritis on methotrexate who presented to the ER with 1 week history of SOB, chest discomfort and  fever/chills. Vitals remarkable for sinus tachycardia and tachypnea. EKG showed sinus tachycardia with PACs and subtle ST elevations in inferior/lateral leads which did not meet STEMI criteria.  Echocardiogram showed moderate pericardial fusion adjacent to lateral wall of LV and anterior to the RV and inconclusive evidence to rule out cardiac tamponade. CT chest with contrast showed moderate to large pericardial effusion with pericardial enhancement, moderate left pleural effusion (with possible underlying pneumonia/infection) and enhancing lesion in the liver/adrenal gland.  # Acute pericarditis with moderate pericardial fusion and inconclusive evidence of pericardial tamponade -Patient has acute onset of SOB, fever, chills x 1 week associated with improvement in chest pain symptoms while sitting up and worse with supine position. EKG showed PR depression and subtle ST elevations in inferior/lateral leads. Echo showed moderate pericardial effusion with inconclusive evidence of tamponade. CT chest showed pericardial enhancement. Patient meets criteria for acute pericarditis. Okay to transfer to [facility] in [place] if any worsening symptoms over the weekend and no coverage of cardiology here. -Cannot start NSAIDs due to AKI. Start aspirin 1000 mg every 8 hours x 2 week followed by tapering aspirin by 500 mg every 1 week. Start omeprazole 20 mg for prophylaxis. -Start colchicine 0.6 mg twice daily x 3 months -Obtain CRP, ESR  # Left pleural effusion with overlying suspected pneumonia/infection -Management per primary team  # Rheumatoid arthritis -Management per primary team  I have spent a total of 55 minutes with patient reviewing chart , telemetry, EKGs, labs and examining patient as well as establishing an assessment and plan that was discussed with the patient.  > 50% of time was spent in direct patient care.    For questions or updates, please contact [place] Please consult  [email or web address] for contact info under Cardiology/STEMI.   Signed, Vangie Bicker, MD 05/18/2022 4:53 PM

## 2022-05-18 NOTE — ED Notes (Signed)
Report was given and accepted by [facility] and accepting nurse at [facility].

## 2022-05-18 NOTE — ED Triage Notes (Signed)
Pt arrived POV with c/o SOB x 1 week, left ear pain, left shoulder pain, chills, low po intake and cougning with mucous x month.

## 2022-05-18 NOTE — H&P (Signed)
History and Physical    Patient: Caitlyn Newman [id number] DOB: [birth year] DOA: 05/18/2022 DOS: the patient was seen and examined on 05/18/2022 PCP: Chevis Pretty, FNP  Patient coming from: Home  Chief Complaint:  Chief Complaint  Patient presents with   Shortness of Breath   HPI: Caitlyn Newman is a 72 year old female with a history of rheumatoid arthritis and hypertension presenting with 1 week history of subjective fevers, chills, shortness of breath, cough, and chest discomfort.  The patient states that she has not seen her PCP for over 5 years.  She states the only medications she takes is her methotrexate and folic acid for her rheumatoid arthritis.  In the past week, the patient has noted increasing dyspnea on exertion.  She has noted some "uneasy feeling" in her chest and left shoulder when she lies down at night.  She states that sitting up makes her feel better.  She has a cough with yellow sputum but denies any hemoptysis.  She denies any headache, neck pain, visual disturbance.  There is no focal extremity weakness.  She denies any abdominal pain, hematochezia, melena.  She has had some loose stool intermittently.  She denies any dysuria or hematuria. In the ED, the patient had a low-grade temperature of 99.1 F.  She was tachycardic into the 120s.  She was hemodynamically stable with oxygen saturation 89% on room air.  She was placed on 1 L with saturation up to 96%.  WBC 22.6, hemoglobin of 12.4, platelets 294,000.  Sodium 134, potassium 4.3, bicarbonate 24, serum creatinine 1.07.  Lactic acid 2.7>> 1.8.  EKG showed sinus tachycardia with some ST elevation in 2, 3, aVF and lateral leads. Initial chest x-ray showed left pleural effusion, LLL collapse and consolidation.  CT of the chest showed a moderate to large pericardial effusion with mild pericardial enhancement suggestive of an exudative process.  There is also moderate left and small right pleural effusion.  Serum  glucose was 355 with anion gap of 11.  Cardiology was consulted and recommended transfer to [facility] due to the patient's severity of illness and no cardiology coverage presently.  Review of Systems: As mentioned in the history of present illness. All other systems reviewed and are negative. Past Medical History:  Diagnosis Date   Anemia    Arthritis    Gout    Pneumonia    as a baby   Wears glasses    Past Surgical History:  Procedure Laterality Date   COLONOSCOPY W/ POLYPECTOMY     LYMPH NODE BIOPSY Right 01/13/2013   Procedure: LYMPH NODE BIOPSY RIGHT GROIN;  Surgeon: Joyice Faster. Cornett, MD;  Location: [facility];  Service: General;  Laterality: Right;   NO PAST SURGERIES     TOTAL KNEE ARTHROPLASTY Right 11/24/2013   Procedure: RIGHT TOTAL KNEE ARTHROPLASTY;  Surgeon: Mauri Pole, MD;  Location: [facility];  Service: Orthopedics;  Laterality: Right;   Social History:  reports that she has never smoked. She has never used smokeless tobacco. She reports that she does not drink alcohol and does not use drugs.  Allergies  Allergen Reactions   Cortisone     Other reaction(s): Rash   Penicillins     Other reaction(s): Other Unknown    Family History  Problem Relation Age of Onset   Diabetes Mother    Kidney disease Mother    Hypertension Mother    Hypertension Father    Diabetes Father     Prior to Admission  medications   Medication Sig Start Date End Date Taking? Authorizing Provider  Cholecalciferol (VITAMIN D) 2000 UNITS CAPS Take 1 capsule by mouth daily.    Yes [provider]  ferrous sulfate 325 (65 FE) MG tablet Take 1 tablet (325 mg total) by mouth 3 (three) times daily after meals. 11/25/13  Yes Babish, Rodman Key, PA-C  folic acid (FOLVITE) 1 MG tablet Take 1 mg by mouth daily.   Yes [provider]  methotrexate (RHEUMATREX) 2.5 MG tablet Take 10 mg by mouth 2 (two) times a week. Wednesdays and Thursdays   Yes [provider]     Physical Exam: Vitals:   05/18/22 1153 05/18/22 1200 05/18/22 1230 05/18/22 1300  BP:  (!) 147/69 (!) 155/74 129/83  Pulse:  (!) 106 (!) 102 (!) 106  Resp:    (!) 21  Temp: 99.1 F (37.3 C)     TempSrc: Oral     SpO2:  93% 95% 96%  Weight:      Height:       GENERAL:  A&O x 3, NAD, well developed, cooperative, follows commands HEENT: [place]/AT, No thrush, No icterus, No oral ulcers Neck:  No neck mass, No meningismus, soft, supple CV: RRR, no S3, no S4, no rub, no JVD Lungs:  bibasilar rales. No wheeze Abd: soft/NT +BS, nondistended Ext: No edema, no lymphangitis, no cyanosis, no rashes Neuro:  CN II-XII intact, strength 4/5 in RUE, RLE, strength 4/5 LUE, LLE; sensation intact bilateral; no dysmetria; babinski equivocal  Data Reviewed: .data reviewed above in history  Assessment and Plan: Acute respiratory failure with hypoxia -Secondary to pleural effusion -Presented with tachypnea and oxygen saturation 89% on room air -Stable on 1-2 L with saturation up to 95% -Wean oxygen as tolerated -COVID/RSV/Flu negative  Pericardial effusion -05/18/2022 CT chest--moderate to large pericardial effusion with mild pericardial enhancement -Concerned about pericarditis/tamponade -Cardiology consulted>> transfer to [facility] -Echocardiogram -Hemodynamically stable presently  Pleural effusion -05/18/22 CT chest--moderate left, small right pleural effusion with extensive LLL atelectasis, but cannot exclude pneumonia. -Discussed with pulmonary, Dr. Doreatha Martin on [facility] for now pending cardiology input of possible pericardiocentesis  SIRS -Patient presented with tachycardia, hypoxia, leukocytosis -Concerned about underlying pneumonia versus pericardial process -Follow blood cultures -Lactic acid 2.7>> 1.8 -Check PCT -Continue empiric ceftriaxone and azithromycin  Uncontrolled diabetes mellitus type 2 with hyperglycemia -This is new diagnosis -Check A1C -NovoLog sliding  scale -Start low-dose Semglee  Essential hypertension -Patient was told she had hypertension in the past, but refused to take any medications -Monitor clinically for now, but suspect she will need to be started on antihypertensive medications  Class I obesity -BMI 34.33 -Lifestyle modification  Rheumatoid arthritis -Patient takes methotrexate 10 mg on Wednesdays and Thursdays -Continue folic acid -Holding further methotrexate pending workup for her pericardial effusion and pleural effusion  Diarrhea -stool pathogen panel  Left Adrenal Nodule/Right Liver lesion -incidental findings on CT -outpt MR    Advance Care Planning: FULL  Consults: cardiology/pulm  Family Communication: sisters at bedside 2/9  Severity of Illness: The appropriate patient status for this patient is INPATIENT. Inpatient status is judged to be reasonable and necessary in order to provide the required intensity of service to ensure the patient's safety. The patient's presenting symptoms, physical exam findings, and initial radiographic and laboratory data in the context of their chronic comorbidities is felt to place them at high risk for further clinical deterioration. Furthermore, it is not anticipated that the patient will be medically stable for discharge  from the hospital within 2 midnights of admission.   * I certify that at the point of admission it is my clinical judgment that the patient will require inpatient hospital care spanning beyond 2 midnights from the point of admission due to high intensity of service, high risk for further deterioration and high frequency of surveillance required.*  Author: Orson Eva, MD 05/18/2022 2:23 PM  For on call review [email or web address].

## 2022-05-18 NOTE — Hospital Course (Signed)
72 year old female with a history of rheumatoid arthritis and hypertension presenting with 1 week history of subjective fevers, chills, shortness of breath, cough, and chest discomfort.  The patient states that she has not seen her PCP for over 5 years.  She states the only medications she takes is her methotrexate and folic acid for her rheumatoid arthritis.  In the past week, the patient has noted increasing dyspnea on exertion.  She has noted some "uneasy feeling" in her chest and left shoulder when she lies down at night.  She states that sitting up makes her feel better.  She has a cough with yellow sputum but denies any hemoptysis.  She denies any headache, neck pain, visual disturbance.  There is no focal extremity weakness.  She denies any abdominal pain, hematochezia, melena.  She has had some loose stool intermittently.  She denies any dysuria or hematuria. In the ED, the patient had a low-grade temperature of 99.1 F.  She was tachycardic into the 120s.  She was hemodynamically stable with oxygen saturation 89% on room air.  She was placed on 1 L with saturation up to 96%.  WBC 22.6, hemoglobin of 12.4, platelets 294,000.  Sodium 134, potassium 4.3, bicarbonate 24, serum creatinine 1.07.  Lactic acid 2.7>> 1.8.  EKG showed sinus tachycardia with some ST elevation in 2, 3, aVF and lateral leads. Initial chest x-ray showed left pleural effusion, LLL collapse and consolidation.  CT of the chest showed a moderate to large pericardial effusion with mild pericardial enhancement suggestive of an exudative process.  There is also moderate left and small right pleural effusion.  Serum glucose was 355 with anion gap of 11.  Cardiology was consulted and recommended transfer to [facility] due to the patient's severity of illness and no cardiology coverage presently.

## 2022-05-18 NOTE — ED Provider Notes (Signed)
[facility] Provider Note   CSN: [id number] Arrival date & time: 05/18/22  [id number]     History  Chief Complaint  Patient presents with   Shortness of Breath    Caitlyn Newman is a 72 y.o. female.  Patient brought in by POV.  Patient with a complaint of flulike symptoms for about a week.  Chills shortness of breath decreased intake coughing productive cough.  Temp on arrival was 97.8.  Heart rate was around 123 respirations 21 blood pressure 158/83 and oxygen saturations were 92% counted to go down to 89 so she was started on 1 L of oxygen.  Past medical history significant for anemia pneumonia gout rheumatoid arthritis.  Patient never used tobacco products.       Home Medications Prior to Admission medications   Medication Sig Start Date End Date Taking? Authorizing Provider  Cholecalciferol (VITAMIN D) 2000 UNITS CAPS Take 1 capsule by mouth daily.    Yes [provider]  ferrous sulfate 325 (65 FE) MG tablet Take 1 tablet (325 mg total) by mouth 3 (three) times daily after meals. 11/25/13  Yes Babish, Rodman Key, PA-C  folic acid (FOLVITE) 1 MG tablet Take 1 mg by mouth daily.   Yes [provider]  methotrexate (RHEUMATREX) 2.5 MG tablet Take 10 mg by mouth 2 (two) times a week. Wednesdays and Thursdays   Yes [provider]      Allergies    Cortisone and Penicillins    Review of Systems   Review of Systems  Constitutional:  Positive for appetite change, chills and fatigue. Negative for fever.  HENT:  Positive for congestion. Negative for ear pain and sore throat.   Eyes:  Negative for pain and visual disturbance.  Respiratory:  Positive for cough and shortness of breath.   Cardiovascular:  Negative for chest pain and palpitations.  Gastrointestinal:  Negative for abdominal pain and vomiting.  Genitourinary:  Negative for dysuria and hematuria.  Musculoskeletal:  Negative for arthralgias and back pain.   Skin:  Negative for color change and rash.  Neurological:  Negative for seizures and syncope.  All other systems reviewed and are negative.   Physical Exam Updated Vital Signs BP (!) 156/81   Pulse (!) 110   Temp 99.1 F (37.3 C) (Oral)   Resp (!) 21   Ht 1.626 m (5' 4"$ )   Wt 90.7 kg   SpO2 96%   BMI 34.33 kg/m  Physical Exam Vitals and nursing note reviewed.  Constitutional:      General: She is not in acute distress.    Appearance: Normal appearance. She is well-developed. She is ill-appearing.  HENT:     Head: Normocephalic and atraumatic.     Mouth/Throat:     Mouth: Mucous membranes are dry.  Eyes:     Extraocular Movements: Extraocular movements intact.     Conjunctiva/sclera: Conjunctivae normal.     Pupils: Pupils are equal, round, and reactive to light.  Cardiovascular:     Rate and Rhythm: Regular rhythm. Tachycardia present.     Heart sounds: No murmur heard. Pulmonary:     Effort: Pulmonary effort is normal. No respiratory distress.     Breath sounds: Normal breath sounds. No wheezing, rhonchi or rales.  Abdominal:     Palpations: Abdomen is soft.     Tenderness: There is no abdominal tenderness.  Musculoskeletal:        General: No swelling.     Cervical  back: Normal range of motion and neck supple.  Skin:    General: Skin is warm and dry.     Capillary Refill: Capillary refill takes less than 2 seconds.  Neurological:     General: No focal deficit present.     Mental Status: She is alert and oriented to person, place, and time.  Psychiatric:        Mood and Affect: Mood normal.     ED Results / Procedures / Treatments   Labs (all labs ordered are listed, but only abnormal results are displayed) Labs Reviewed  CBC WITH DIFFERENTIAL/PLATELET - Abnormal; Notable for the following components:      Result Value   WBC 22.6 (*)    Neutro Abs 21.2 (*)    Lymphs Abs 0.6 (*)    Abs Immature Granulocytes 0.17 (*)    All other components within  normal limits  BASIC METABOLIC PANEL - Abnormal; Notable for the following components:   Sodium 134 (*)    Glucose, Bld 355 (*)    Creatinine, Ser 1.07 (*)    Calcium 8.4 (*)    GFR, Estimated 56 (*)    All other components within normal limits  BLOOD GAS, VENOUS - Abnormal; Notable for the following components:   pH, Ven 7.48 (*)    pCO2, Ven 38 (*)    pO2, Ven 47 (*)    Bicarbonate 28.4 (*)    Acid-Base Excess 4.5 (*)    All other components within normal limits  LACTIC ACID, PLASMA - Abnormal; Notable for the following components:   Lactic Acid, Venous 2.7 (*)    All other components within normal limits  CBG MONITORING, ED - Abnormal; Notable for the following components:   Glucose-Capillary 223 (*)    All other components within normal limits  CBG MONITORING, ED - Abnormal; Notable for the following components:   Glucose-Capillary 119 (*)    All other components within normal limits  RESP PANEL BY RT-PCR (RSV, FLU A&B, COVID)  RVPGX2  CULTURE, BLOOD (ROUTINE X 2)  CULTURE, BLOOD (ROUTINE X 2)  BETA-HYDROXYBUTYRIC ACID  LACTIC ACID, PLASMA  HEMOGLOBIN A1C  SEDIMENTATION RATE  C-REACTIVE PROTEIN  PROCALCITONIN  BRAIN NATRIURETIC PEPTIDE    EKG EKG Interpretation  Date/Time:  Friday May 18 2022 07:56:51 EST Ventricular Rate:  121 PR Interval:  128 QRS Duration: 94 QT Interval:  307 QTC Calculation: 436 R Axis:   73 Text Interpretation: Sinus tachycardia Atrial premature complexes Low voltage, precordial leads Minimal ST elevation, lateral leads No previous ECGs available Confirmed by Fredia Sorrow [phone]) on 05/18/2022 8:08:46 AM  Radiology CT Chest W Contrast  Result Date: 05/18/2022 CLINICAL DATA:  72 year old female with shortness of breath, cough and chills. EXAM: CT CHEST WITH CONTRAST TECHNIQUE: Multidetector CT imaging of the chest was performed during intravenous contrast administration. RADIATION DOSE REDUCTION: This exam was performed according to  the departmental dose-optimization program which includes automated exposure control, adjustment of the mA and/or kV according to patient size and/or use of iterative reconstruction technique. CONTRAST:  81m OMNIPAQUE IOHEXOL 300 MG/ML  SOLN COMPARISON:  05/18/2022 and prior chest radiographs FINDINGS: Cardiovascular: A moderate to large pericardial effusion is identified with possible mild pericardial enhancement. UPPER limits normal heart size identified. There is no evidence of thoracic aortic aneurysm. Mediastinum/Nodes: No enlarged mediastinal, hilar, or axillary lymph nodes. Thyroid gland, trachea, and esophagus demonstrate no significant findings. Lungs/Pleura: A moderate LEFT pleural effusion is noted which may be loculated. A small  RIGHT pleural effusion is identified. Moderate to extensive lingular and LEFT LOWER lobe atelectasis noted. Moderate RIGHT basilar atelectasis is identified. There is no evidence of pneumothorax. Upper Abdomen: An indeterminate 1.7 cm hyperdense/enhancing lesion within the posterior RIGHT liver (image 130: Series 2) is noted. An indeterminate 2 cm LEFT adrenal nodule is identified. Musculoskeletal: No acute or suspicious bony abnormalities are noted. Moderate facet arthropathy in the LOWER thoracic and UPPER lumbar spine identified. IMPRESSION: 1. Moderate to large pericardial effusion with possible mild pericardial enhancement, suggesting exudative process. 2. Moderate LEFT pleural effusion which may be loculated. Small RIGHT pleural effusion. 3. Moderate to extensive lingular and LEFT LOWER lobe atelectasis. Moderate RIGHT basilar atelectasis. Superimposed infection/pneumonia are not excluded in these areas. 4. Indeterminate 1.7 cm hyperdense/enhancing lesion within the posterior RIGHT liver and indeterminate 2 cm LEFT adrenal nodule. Elective MRI of the abdomen with and without contrast recommended for further evaluation. Critical Value/emergent results were called by  telephone at the time of interpretation on 05/18/2022 at 11:36 am to provider Fredia Sorrow , who verbally acknowledged these results. Electronically Signed   By: Margarette Canada M.D.   On: 05/18/2022 11:40   DG Chest Port 1 View  Result Date: 05/18/2022 CLINICAL DATA:  Shortness of breath EXAM: PORTABLE CHEST 1 VIEW COMPARISON:  10/28/2013 FINDINGS: Overall lower lung volumes with bibasilar atelectasis. Heart is enlarged. Left pleural effusion noted with left lower lung collapse/consolidation. Pneumonia not excluded. No pneumothorax. Degenerative changes throughout the spine. IMPRESSION: 1. Lower lung volumes with bibasilar atelectasis. 2. Left pleural effusion and left lower lung collapse/consolidation. Electronically Signed   By: Jerilynn Mages.  Shick M.D.   On: 05/18/2022 08:13    Procedures Procedures    Medications Ordered in ED Medications  albuterol (VENTOLIN HFA) 108 (90 Base) MCG/ACT inhaler 2 puff (has no administration in time range)  0.9 %  sodium chloride infusion ( Intravenous New Bag/Given 05/18/22 1249)  living well with diabetes book MISC (has no administration in time range)  insulin aspart (novoLOG) injection 0-5 Units (has no administration in time range)  insulin aspart (novoLOG) injection 0-15 Units (has no administration in time range)  perflutren lipid microspheres (DEFINITY) IV suspension (2 mLs Intravenous Given 05/18/22 1616)  sodium chloride 0.9 % bolus 500 mL (0 mLs Intravenous Stopped 05/18/22 1248)  iohexol (OMNIPAQUE) 300 MG/ML solution 75 mL (75 mLs Intravenous Contrast Given 05/18/22 1112)  cefTRIAXone (ROCEPHIN) 1 g in sodium chloride 0.9 % 100 mL IVPB (0 g Intravenous Stopped 05/18/22 1249)  azithromycin (ZITHROMAX) 500 mg in sodium chloride 0.9 % 250 mL IVPB (500 mg Intravenous New Bag/Given 05/18/22 1254)  insulin aspart (novoLOG) injection 10 Units (10 Units Intravenous Given 05/18/22 1215)    ED Course/ Medical Decision Making/ A&P                             Medical Decision  Making Amount and/or Complexity of Data Reviewed Labs: ordered. Radiology: ordered.  Risk Prescription drug management. Decision regarding hospitalization.   CRITICAL CARE Performed by: Fredia Sorrow Total critical care time: 40 minutes Critical care time was exclusive of separately billable procedures and treating other patients. Critical care was necessary to treat or prevent imminent or life-threatening deterioration. Critical care was time spent personally by me on the following activities: development of treatment plan with patient and/or surrogate as well as nursing, discussions with consultants, evaluation of patient's response to treatment, examination of patient, obtaining history from patient or  surrogate, ordering and performing treatments and interventions, ordering and review of laboratory studies, ordering and review of radiographic studies, pulse oximetry and re-evaluation of patient's condition.  Patient initial concerns were for like a flulike illness.  Respiratory panel COVID flu and RSV were negative.  Patient does not have a history of diabetes.  Blood sugar was markedly elevated at 355.  Creatinine was 1.07 and potassium good at 4.3.  CO2 24.  GFR 56.  CBC remarkable for white count of 22,000 hemoglobin 12.4.  Platelets 295.  Blood cultures were done lactic acid done initial lactic acid was 2.7.  Patient's beta hydroxybutyrate was 0.09 so that was normal.  Venous blood gas had a normal pH 7.48.  Bicarb was normal 2 repeat lactic acid after some fluids was 1.8.  Patient given 10 units of insulin and blood sugar came down to 119.  Chest x-ray raise some concerns about pleural effusion and/or pneumonia.  So CT chest was done that showed moderate to large pericardial effusion with possible pericardial enhancement suggesting exudative process.  Also moderate left pleural effusion which may be loculated small right pleural effusion.  Left lower lobe atelectasis right lower lobe  atelectasis but could be superimposed infection or pneumonia there.  Also indeterminate lesion right lobe of the liver and left adrenal nodule.  Based on this patient started on pneumonia medicines Rocephin and Zithromax IV.  Cardiology contacted pulmonary medicine contacted.  Felt that patient probably would need to be admitted to [facility] medicine admission and may require consultation in addition to cardiology and pulmonary medicine with cardiothoracic surgery.  Cardiology came and did echocardiogram.  That showed that it was not a large pericardial effusion moderate but still agree with admission to [facility] hospitalist was contacted prior to them doing the echo and and they did arrange for patient to be admitted to [facility].    Final Clinical Impression(s) / ED Diagnoses Final diagnoses:  New onset type 2 diabetes mellitus ([place])  Hypoxia  Influenza-like illness  Pericardial effusion  Pleural effusion    Rx / DC Orders ED Discharge Orders     None         Fredia Sorrow, MD 05/18/22 1646

## 2022-05-18 NOTE — ED Notes (Signed)
[facility] is in house picking up pt to transport to [facility]. Family is in the room and aware of transport destination.

## 2022-05-18 NOTE — Inpatient Diabetes Management (Addendum)
Inpatient Diabetes Program Recommendations  AACE/ADA: New Consensus Statement on Inpatient Glycemic Control   Target Ranges:  Prepandial:   less than 140 mg/dL      Peak postprandial:   less than 180 mg/dL (1-2 hours)      Critically ill patients:  140 - 180 mg/dL    Latest Reference Range & Units 05/18/22 12:07  Glucose-Capillary 70 - 99 mg/dL 223 (H)    Latest Reference Range & Units 05/18/22 08:11  Glucose 70 - 99 mg/dL 355 (H)    Review of Glycemic Control  Diabetes history: No Outpatient Diabetes medications: NA Current orders for Inpatient glycemic control: None; in ED  Inpatient Diabetes Program Recommendations:    HbgA1C: Please consider ordering an A1C to evaluate glycemic control over the past 2-3 months.  Outpatient: If patient is dx with DM, please provide Rx for glucose monitoring kit. If patient would be discharged on insulin, she would prefer insulin pens.  NOTE: Spoke with patient and 2 sisters at bedside about any prior DM hx. Patient states that she has never been dx with DM but she has several family members with DM. Patient states she has checked her father's glucose in the past. Patient reports that she sees Caitlyn Pretty, Caitlyn Newman for primary care but notes that she has not seen her in a very long time. Patient reports that she sees her provider that manages her rheumatoid arthritis every 3 months. Patient states she has blood work drawn every 3 months but not sure if they have done a BMET when labs are checked. Discussed initial glucose of 355 mg/dl and explained that she will likely be newly dx with DM.  Explained what an A1C is informed patient that an A1C would likely be ordered and she should be informed of results. If she happens to be discharged before A1C results she can check MyChart to get results.  Discussed basic pathophysiology of DM Type 2, basic home care, importance of checking CBGs and maintaining good CBG control to prevent long-term and short-term  complications. Reviewed glucose and A1C goals. Reviewed signs and symptoms of hyperglycemia and hypoglycemia along with treatment for both. Discussed impact of nutrition, exercise, stress, sickness, and medications on diabetes control. Patient states that she has not taken any steroids or received any steroid injections within the past 3-6 months. Informed patient that if she ever gets any steroids for her RA that it would increase her glucose. Informed patient that a Living Well with diabetes booklet was ordered and encouraged patient to read through entire book when received. Patient states she lives with her brother and sister and she is the main person that cooks in her home. Discussed Carb Modified diet and encouraged patient to follow carb modified diet. Informed patient that depending on how glucose trends and A1C results, it would be determined if she will need oral DM medications or insulin as an outpatient. Patient states she does not think she would do well with finger sticks or insulin because she does not like sticks.  Patient verbalized understanding of information discussed and she states that she has no further questions at this time related to diabetes.   RNs to provide ongoing basic DM education at bedside with this patient and engage patient to actively check blood glucose and administer insulin injections if needed.  Dr. Carles Collet at bedside to see patient at this time.  Talked with patient again to follow up and explained to patient that it was unclear if she would  need insulin outpatient or not but I would review since our team is not on campus over the weekend. Patient is hopeful not to have to take insulin. Patient's sister at bedside said she uses insulin pens and if patient is discharged on insulin patient would prefer to use insulin pens. Educated patient on insulin pen use at home. Reviewed all steps of insulin pen including attachment of needle, 2-unit air shot, dialing up dose, giving  injection, removing needle, disposal of sharps, storage of unused insulin, disposal of insulin etc. Patient's sister states she will be able to help her at home if she is discharged new to insulin as well. Patient verbalized understanding of information and has no questions at this time.   Thanks, Caitlyn Alderman, RN, MSN, CDE Diabetes Coordinator Inpatient Diabetes Program [phone] (Team Pager from 8am to 5pm)

## 2022-05-19 ENCOUNTER — Inpatient Hospital Stay (HOSPITAL_COMMUNITY): Payer: Medicare HMO

## 2022-05-19 DIAGNOSIS — J90 Pleural effusion, not elsewhere classified: Secondary | ICD-10-CM | POA: Diagnosis not present

## 2022-05-19 DIAGNOSIS — I3139 Other pericardial effusion (noninflammatory): Secondary | ICD-10-CM | POA: Diagnosis not present

## 2022-05-19 DIAGNOSIS — M069 Rheumatoid arthritis, unspecified: Secondary | ICD-10-CM | POA: Diagnosis not present

## 2022-05-19 DIAGNOSIS — I319 Disease of pericardium, unspecified: Secondary | ICD-10-CM

## 2022-05-19 DIAGNOSIS — I300 Acute nonspecific idiopathic pericarditis: Secondary | ICD-10-CM

## 2022-05-19 DIAGNOSIS — J189 Pneumonia, unspecified organism: Secondary | ICD-10-CM | POA: Diagnosis not present

## 2022-05-19 LAB — RESPIRATORY PANEL BY PCR

## 2022-05-19 LAB — BASIC METABOLIC PANEL
Anion gap: 7 (ref 5–15)
BUN: 12 mg/dL (ref 8–23)
CO2: 24 mmol/L (ref 22–32)
Calcium: 7.8 mg/dL — ABNORMAL LOW (ref 8.9–10.3)
Chloride: 104 mmol/L (ref 98–111)
Creatinine, Ser: 0.84 mg/dL (ref 0.44–1.00)
GFR, Estimated: >60 mL/min (ref >60)
Glucose, Bld: 128 mg/dL — ABNORMAL HIGH (ref 70–99)
Potassium: 4.2 mmol/L (ref 3.5–5.1)
Sodium: 135 mmol/L (ref 135–145)

## 2022-05-19 LAB — CBC
HCT: 33.2 % — ABNORMAL LOW (ref 36.0–46.0)
Hemoglobin: 11.2 g/dL — ABNORMAL LOW (ref 12.0–15.0)
MCH: 32 pg (ref 26.0–34.0)
MCHC: 33.7 g/dL (ref 30.0–36.0)
MCV: 94.9 fL (ref 80.0–100.0)
Platelets: 232 10*3/uL (ref 150–400)
RBC: 3.5 MIL/uL — ABNORMAL LOW (ref 3.87–5.11)
RDW: 14.2 % (ref 11.5–15.5)
WBC: 17.9 10*3/uL — ABNORMAL HIGH (ref 4.0–10.5)
nRBC: 0 % (ref 0.0–0.2)

## 2022-05-19 LAB — ECHOCARDIOGRAM LIMITED
Height: 64 in
Weight: 3033.53 oz

## 2022-05-19 LAB — GLUCOSE, CAPILLARY
Glucose-Capillary: 151 mg/dL — ABNORMAL HIGH (ref 70–99)
Glucose-Capillary: 126 mg/dL — ABNORMAL HIGH (ref 70–99)
Glucose-Capillary: 79 mg/dL (ref 70–99)
Glucose-Capillary: 95 mg/dL (ref 70–99)

## 2022-05-19 LAB — STREP PNEUMONIAE URINARY ANTIGEN: Strep Pneumo Urinary Antigen: NEGATIVE

## 2022-05-19 LAB — MRSA NEXT GEN BY PCR, NASAL: MRSA by PCR Next Gen: NOT DETECTED

## 2022-05-19 LAB — MAGNESIUM: Magnesium: 1.9 mg/dL (ref 1.7–2.4)

## 2022-05-19 MED ORDER — FUROSEMIDE 10 MG/ML IJ SOLN
20.0000 mg | Freq: Two times a day (BID) | INTRAMUSCULAR | Status: DC
  Administered 2022-05-19 – 2022-05-20 (×3): 20 mg via INTRAVENOUS
  Filled 2022-05-19 (×3): qty 2

## 2022-05-19 MED ORDER — FUROSEMIDE 10 MG/ML IJ SOLN
20.0000 mg | Freq: Once | INTRAMUSCULAR | Status: AC
  Administered 2022-05-19: 20 mg via INTRAVENOUS
  Filled 2022-05-19: qty 2

## 2022-05-19 NOTE — Progress Notes (Signed)
Mobility Specialist Progress Note:   05/19/22 1503  Mobility  Activity Ambulated independently in hallway  Level of Assistance Modified independent, requires aide device or extra time  Assistive Device Front wheel walker  Distance Ambulated (ft) 500 ft  Activity Response Tolerated well  Mobility Referral Yes  $Mobility charge 1 Mobility   Pt received in bed and agreeable. No complaints. Pt returned to bed with all needs met, call bell in reach, and family in room.   Andrey Campanile Mobility Specialist Please contact via SecureChat or  Rehab office at [phone]

## 2022-05-19 NOTE — Progress Notes (Signed)
PROGRESS NOTE    Caitlyn Newman  [id number] DOB: [birth year] DOA: 05/18/2022 PCP: Chevis Pretty, FNP  Chief Complaint  Patient presents with   Shortness of Breath    Brief Narrative:   Caitlyn Newman is Caitlyn Newman 72 year old female with Caitlyn Newman history of rheumatoid arthritis and hypertension presenting with 1 week history of subjective fevers, chills, shortness of breath, cough, and chest discomfort.   She's been admitted with Ruthellen Tippy pericardial effusion and concern for pneumonia with pleural effusions.  See below for additional details   Assessment & Plan:   Principal Problem:   Pericarditis Active Problems:   Class 1 obesity   Acute respiratory failure with hypoxia (HCC)   SIRS (systemic inflammatory response syndrome) (HCC)   Uncontrolled type 2 diabetes mellitus with hyperglycemia, without long-term current use of insulin (HCC)   Pleural effusion  Acute respiratory failure with hypoxia - resolved, due to below   Pericardial effusion -05/18/2022 CT chest--moderate to large pericardial effusion with mild pericardial enhancement - echo 2/9 unable to definitely rule out cardiac tamponade - repeat echo 2/10 pending today - cardiology recommending aspirin 1000 mg q8 x2 weeks, followed by tapering aspirin 500 mg every week.  PPI daily.  Colchicine 0.6 mg BID x3 months. - lasix for evidence of overload per cards   Sepsis due to Community Acquired Pneumonia  Moderate Left Pleural Effusion and Small Right Effusion - tachycardia, leukocytosis, hypoxia at presentation with evidence of pneumonia on imaging - CXR with moderate L effusion which may be loculated, moderate to extensive lingular and LL lobe atelectasis, moderate R basilar atelectasis.  Superimposed infection/pneumonia are not excluded. - continue CAP coverage with ceftriaxone/azithromycin  - Follow blood cultures - sputum cx if able to produce - serum fungitell, MRSA PCR.  Urine strep, urine legionella.  Aspergillus Ag.   RVP.   - negative covid, influenza, RSV - negative HIV - procalcitonin is 0.62, follow - PCCM c/s for pleural effusion, appreciate recs    Prediabetes -Check A1C 5.9 -NovoLog sliding scale -Start low-dose Semglee -follow    Essential hypertension BP ok today, monitor to consider BP meds   Class I obesity Body mass index is 32.54 kg/m.   Rheumatoid arthritis -Patient takes methotrexate 10 mg on Wednesdays and Thursdays -Continue folic acid -Holding further methotrexate pending workup for her pericardial effusion and pleural effusion   Diarrhea -stool pathogen panel (if he has another BMP)   Left Adrenal Nodule/Right Liver lesion -incidental findings on CT (indeterminate 1.7 cm hyperdense/enhancing lesion within the posterior right liver and indeterminate 2 cm L adrenal nodule) -needs an elective MRI of abdomen with and without contrast, can pursue this outpatient    DVT prophylaxis: lovenox Code Status: full Family Communication: brother at bedside, but he stepped out while I was in room  Disposition:   Status is: Inpatient Remains inpatient appropriate because: continued inpatient care   Consultants:  Cardiology PCCM  Procedures:  Echo IMPRESSIONS     1. Poor echo windows   2. Moderate pericardial effusion. The pericardial effusion is lateral to  the left ventricle and anterior to the right ventricle. Moderate pleural  effusion in the left lateral region. Right atrium and right ventricle not  well visualized to definitely  rule out cardiac tampoande. IVC not well visualized.   3. Challenging study due to tachycardia. Left ventricular ejection  fraction, by estimation, is 55 to 60%. The left ventricle has normal  function. The left ventricle has no regional wall motion abnormalities.  Left ventricular diastolic function  could not   be evaluated.   4. Right ventricular systolic function was not well visualized. The right  ventricular size is not well  visualized. Tricuspid regurgitation signal is  inadequate for assessing PA pressure.   5. The mitral valve is normal in structure. No evidence of mitral valve  regurgitation. No evidence of mitral stenosis.   6. The aortic valve is tricuspid. Aortic valve regurgitation is not  visualized. No aortic stenosis is present.   Comparison(s): No prior Echocardiogram.    Antimicrobials:  Anti-infectives (From admission, onward)    Start     Dose/Rate Route Frequency Ordered Stop   05/19/22 1000  azithromycin (ZITHROMAX) 500 mg in sodium chloride 0.9 % 250 mL IVPB        500 mg 250 mL/hr over 60 Minutes Intravenous Every 24 hours 05/18/22 2336     05/19/22 1000  cefTRIAXone (ROCEPHIN) 1 g in sodium chloride 0.9 % 100 mL IVPB        1 g 200 mL/hr over 30 Minutes Intravenous Every 24 hours 05/18/22 2336     05/18/22 1145  cefTRIAXone (ROCEPHIN) 1 g in sodium chloride 0.9 % 100 mL IVPB        1 g 200 mL/hr over 30 Minutes Intravenous  Once 05/18/22 1141 05/18/22 1249   05/18/22 1145  azithromycin (ZITHROMAX) 500 mg in sodium chloride 0.9 % 250 mL IVPB        500 mg 250 mL/hr over 60 Minutes Intravenous  Once 05/18/22 1141 05/18/22 1711       Subjective: Feels Caitlyn Newman little better today  Objective: Vitals:   05/19/22 0002 05/19/22 0505 05/19/22 0820 05/19/22 1114  BP: 120/74 (!) 153/74 137/80 139/73  Pulse: 100 (!) 107 (!) 114 (!) 104  Resp: 18 20 20 18  $ Temp: 98.8 F (37.1 C) 99.1 F (37.3 C)  98.9 F (37.2 C)  TempSrc: Oral Oral  Axillary  SpO2: 97% 94% 96% 93%  Weight:  86 kg    Height:        Intake/Output Summary (Last 24 hours) at 05/19/2022 1439 Last data filed at 05/18/2022 1711 Gross per 24 hour  Intake 255.08 ml  Output --  Net 255.08 ml   Filed Weights   05/18/22 0752 05/19/22 0505  Weight: 90.7 kg 86 kg    Examination:  General exam: Appears calm and comfortable  Respiratory system: diminished, unlabored Cardiovascular system: RRR Gastrointestinal system:  Abdomen is nondistended, soft and nontender. Central nervous system: Alert and oriented. No focal neurological deficits. Extremities: trace LE edema  Data Reviewed: I have personally reviewed following labs and imaging studies  CBC: Recent Labs  Lab 05/18/22 0811 05/19/22 0140  WBC 22.6* 17.9*  NEUTROABS 21.2*  --   HGB 12.4 11.2*  HCT 38.2 33.2*  MCV 96.2 94.9  PLT 294 [id number]    Basic Metabolic Panel: Recent Labs  Lab 05/18/22 0811 05/19/22 0140  NA 134* 135  K 4.3 4.2  CL 99 104  CO2 24 24  GLUCOSE 355* 128*  BUN 16 12  CREATININE 1.07* 0.84  CALCIUM 8.4* 7.8*  MG  --  1.9    GFR: Estimated Creatinine Clearance: 67.1 mL/min (by C-G formula based on SCr of 0.84 mg/dL).  Liver Function Tests: No results for input(s): "AST", "ALT", "ALKPHOS", "BILITOT", "PROT", "ALBUMIN" in the last 168 hours.  CBG: Recent Labs  Lab 05/18/22 1523 05/18/22 1749 05/18/22 2210 05/19/22 0815 05/19/22 1111  GLUCAP 119* 106* 140* 151* 126*  Recent Results (from the past 240 hour(s))  Resp panel by RT-PCR (RSV, Flu Vickye Astorino&B, Covid) Anterior Nasal Swab     Status: None   Collection Time: 05/18/22  7:59 AM   Specimen: Anterior Nasal Swab  Result Value Ref Range Status   SARS Coronavirus 2 by RT PCR NEGATIVE NEGATIVE Final    Comment: (NOTE) SARS-CoV-2 target nucleic acids are NOT DETECTED.  The SARS-CoV-2 RNA is generally detectable in upper respiratory specimens during the acute phase of infection. The lowest concentration of SARS-CoV-2 viral copies this assay can detect is 138 copies/mL. Hania Cerone negative result does not preclude SARS-Cov-2 infection and should not be used as the sole basis for treatment or other patient management decisions. Rogenia Werntz negative result may occur with  improper specimen collection/handling, submission of specimen other than nasopharyngeal swab, presence of viral mutation(s) within the areas targeted by this assay, and inadequate number of viral copies(<138  copies/mL). Joscelyne Renville negative result must be combined with clinical observations, patient history, and epidemiological information. The expected result is Negative.  Fact Sheet for Patients:  EntrepreneurPulse.com.au  Fact Sheet for Healthcare Providers:  IncredibleEmployment.be  This test is no t yet approved or cleared by the [place] FDA and  has been authorized for detection and/or diagnosis of SARS-CoV-2 by FDA under an Emergency Use Authorization (EUA). This EUA will remain  in effect (meaning this test can be used) for the duration of the COVID-19 declaration under Section 564(b)(1) of the Act, 21 U.S.C.section 360bbb-3(b)(1), unless the authorization is terminated  or revoked sooner.       Influenza Chavie Kolinski by PCR NEGATIVE NEGATIVE Final   Influenza B by PCR NEGATIVE NEGATIVE Final    Comment: (NOTE) The Xpert Xpress SARS-CoV-2/FLU/RSV plus assay is intended as an aid in the diagnosis of influenza from Nasopharyngeal swab specimens and should not be used as Shonya Sumida sole basis for treatment. Nasal washings and aspirates are unacceptable for Xpert Xpress SARS-CoV-2/FLU/RSV testing.  Fact Sheet for Patients: EntrepreneurPulse.com.au  Fact Sheet for Healthcare Providers: IncredibleEmployment.be  This test is not yet approved or cleared by the [place] FDA and has been authorized for detection and/or diagnosis of SARS-CoV-2 by FDA under an Emergency Use Authorization (EUA). This EUA will remain in effect (meaning this test can be used) for the duration of the COVID-19 declaration under Section 564(b)(1) of the Act, 21 U.S.C. section 360bbb-3(b)(1), unless the authorization is terminated or revoked.     Resp Syncytial Virus by PCR NEGATIVE NEGATIVE Final    Comment: (NOTE) Fact Sheet for Patients: EntrepreneurPulse.com.au  Fact Sheet for Healthcare  Providers: IncredibleEmployment.be  This test is not yet approved or cleared by the [place] FDA and has been authorized for detection and/or diagnosis of SARS-CoV-2 by FDA under an Emergency Use Authorization (EUA). This EUA will remain in effect (meaning this test can be used) for the duration of the COVID-19 declaration under Section 564(b)(1) of the Act, 21 U.S.C. section 360bbb-3(b)(1), unless the authorization is terminated or revoked.  Performed at [facility], [address] [place], [place] [address]   Culture, blood (Routine X 2) w Reflex to ID Panel     Status: None (Preliminary result)   Collection Time: 05/18/22  9:32 AM   Specimen: BLOOD  Result Value Ref Range Status   Specimen Description BLOOD BLOOD RIGHT ARM  Final   Special Requests   Final    BOTTLES DRAWN AEROBIC AND ANAEROBIC Blood Culture adequate volume   Culture   Final  NO GROWTH < 24 HOURS Performed at [facility], [address] [place], [place] [address]    Report Status PENDING  Incomplete  Culture, blood (Routine X 2) w Reflex to ID Panel     Status: None (Preliminary result)   Collection Time: 05/18/22  9:32 AM   Specimen: BLOOD  Result Value Ref Range Status   Specimen Description BLOOD BLOOD LEFT HAND  Final   Special Requests   Final    BOTTLES DRAWN AEROBIC AND ANAEROBIC Blood Culture results may not be optimal due to an excessive volume of blood received in culture bottles   Culture   Final    NO GROWTH < 24 HOURS Performed at [facility], [address] [place], [place] [address]    Report Status PENDING  Incomplete         Radiology Studies: ECHOCARDIOGRAM COMPLETE  Result Date: 05/18/2022    ECHOCARDIOGRAM REPORT   Patient Name:   Caitlyn Newman Date of Exam: 05/18/2022 Medical Rec #:  [id number]        Height:       64.0 in Accession #:    [id number]       Weight:       200.0 lb Date of Birth:  12/23/1950       BSA:          1.956 m Patient Age:    3  years         BP:           153/83 mmHg Patient Gender: F                HR:           100 bpm. Exam Location:  [facility] Procedure: 2D Echo, Color Doppler, Cardiac Doppler and Intracardiac            Opacification Agent STAT ECHO Indications:    Pericardial effusion  History:        Patient has no prior history of Echocardiogram examinations.                 Signs/Symptoms:Shortness of Breath, Fever and Chest Pain; Risk                 Factors:Hypertension.  Sonographer:    Eartha Inch Referring Phys: [phone] DAVID TAT  Sonographer Comments: Technically difficult study due to poor echo windows and patient is obese. Image acquisition challenging due to patient body habitus and Image acquisition challenging due to respiratory motion. IMPRESSIONS  1. Poor echo windows  2. Moderate pericardial effusion. The pericardial effusion is lateral to the left ventricle and anterior to the right ventricle. Moderate pleural effusion in the left lateral region. Right atrium and right ventricle not well visualized to definitely rule out cardiac tampoande. IVC not well visualized.  3. Challenging study due to tachycardia. Left ventricular ejection fraction, by estimation, is 55 to 60%. The left ventricle has normal function. The left ventricle has no regional wall motion abnormalities. Left ventricular diastolic function could not  be evaluated.  4. Right ventricular systolic function was not well visualized. The right ventricular size is not well visualized. Tricuspid regurgitation signal is inadequate for assessing PA pressure.  5. The mitral valve is normal in structure. No evidence of mitral valve regurgitation. No evidence of mitral stenosis.  6. The aortic valve is tricuspid. Aortic valve regurgitation is not visualized. No aortic stenosis is present. Comparison(s): No prior Echocardiogram. FINDINGS  Left Ventricle: Left ventricular ejection  fraction, by estimation, is 55 to 60%. The left ventricle has normal function. The  left ventricle has no regional wall motion abnormalities. Definity contrast agent was given IV to delineate the left ventricular  endocardial borders. The left ventricular internal cavity size was normal in size. There is no left ventricular hypertrophy. Left ventricular diastolic function could not be evaluated. Right Ventricle: The right ventricular size is not well visualized. Right vetricular wall thickness was not well visualized. Right ventricular systolic function was not well visualized. Tricuspid regurgitation signal is inadequate for assessing PA pressure. Left Atrium: Left atrial size was normal in size. Right Atrium: Right atrial size was normal in size. Pericardium: Clarine Elrod moderately sized pericardial effusion is present. The pericardial effusion is lateral to the left ventricle and anterior to the right ventricle. Mitral Valve: The mitral valve is normal in structure. No evidence of mitral valve regurgitation. No evidence of mitral valve stenosis. Tricuspid Valve: The tricuspid valve is not well visualized. Tricuspid valve regurgitation is not demonstrated. No evidence of tricuspid stenosis. Aortic Valve: The aortic valve is tricuspid. Aortic valve regurgitation is not visualized. No aortic stenosis is present. Pulmonic Valve: The pulmonic valve was not well visualized. Pulmonic valve regurgitation is not visualized. No evidence of pulmonic stenosis. Aorta: The aortic root and ascending aorta are structurally normal, with no evidence of dilitation. Venous: The inferior vena cava was not well visualized. IAS/Shunts: No atrial level shunt detected by color flow Doppler. Additional Comments: There is Caitlyn Newman moderate pleural effusion in the left lateral region.  LEFT VENTRICLE PLAX 2D LVIDd:         3.00 cm LVIDs:         2.40 cm LV PW:         1.10 cm LV IVS:        0.90 cm LVOT diam:     2.00 cm LVOT Area:     3.14 cm  LEFT ATRIUM             Index        RIGHT ATRIUM           Index LA diam:        3.00 cm 1.53  cm/m   RA Area:     18.20 cm LA Vol (A2C):   59.7 ml 30.52 ml/m  RA Volume:   44.60 ml  22.80 ml/m LA Vol (A4C):   49.2 ml 25.15 ml/m LA Biplane Vol: 54.4 ml 27.81 ml/m   AORTA Ao Root diam: 3.20 cm Ao Asc diam:  3.00 cm  SHUNTS Systemic Diam: 2.00 cm Caitlyn Newman Electronically signed by Lorelee Cover Newman Signature Date/Time: 05/18/2022/4:52:38 PM    Final    CT Chest W Contrast  Result Date: 05/18/2022 CLINICAL DATA:  72 year old female with shortness of breath, cough and chills. EXAM: CT CHEST WITH CONTRAST TECHNIQUE: Multidetector CT imaging of the chest was performed during intravenous contrast administration. RADIATION DOSE REDUCTION: This exam was performed according to the departmental dose-optimization program which includes automated exposure control, adjustment of the mA and/or kV according to patient size and/or use of iterative reconstruction technique. CONTRAST:  71m OMNIPAQUE IOHEXOL 300 MG/ML  SOLN COMPARISON:  05/18/2022 and prior chest radiographs FINDINGS: Cardiovascular: Caitlyn Newman moderate to large pericardial effusion is identified with possible mild pericardial enhancement. UPPER limits normal heart size identified. There is no evidence of thoracic aortic aneurysm. Mediastinum/Nodes: No enlarged mediastinal, hilar, or axillary lymph nodes. Thyroid gland, trachea, and esophagus demonstrate no significant findings. Lungs/Pleura:  Caitlyn Newman moderate LEFT pleural effusion is noted which may be loculated. Caitlyn Newman small RIGHT pleural effusion is identified. Moderate to extensive lingular and LEFT LOWER lobe atelectasis noted. Moderate RIGHT basilar atelectasis is identified. There is no evidence of pneumothorax. Upper Abdomen: An indeterminate 1.7 cm hyperdense/enhancing lesion within the posterior RIGHT liver (image 130: Series 2) is noted. An indeterminate 2 cm LEFT adrenal nodule is identified. Musculoskeletal: No acute or suspicious bony abnormalities are noted. Moderate facet arthropathy in  the LOWER thoracic and UPPER lumbar spine identified. IMPRESSION: 1. Moderate to large pericardial effusion with possible mild pericardial enhancement, suggesting exudative process. 2. Moderate LEFT pleural effusion which may be loculated. Small RIGHT pleural effusion. 3. Moderate to extensive lingular and LEFT LOWER lobe atelectasis. Moderate RIGHT basilar atelectasis. Superimposed infection/pneumonia are not excluded in these areas. 4. Indeterminate 1.7 cm hyperdense/enhancing lesion within the posterior RIGHT liver and indeterminate 2 cm LEFT adrenal nodule. Elective MRI of the abdomen with and without contrast recommended for further evaluation. Critical Value/emergent results were called by telephone at the time of interpretation on 05/18/2022 at 11:36 am to provider Caitlyn Newman , who verbally acknowledged these results. Electronically Signed   By: Margarette Canada M.D.   On: 05/18/2022 11:40   DG Chest Port 1 View  Result Date: 05/18/2022 CLINICAL DATA:  Shortness of breath EXAM: PORTABLE CHEST 1 VIEW COMPARISON:  10/28/2013 FINDINGS: Overall lower lung volumes with bibasilar atelectasis. Heart is enlarged. Left pleural effusion noted with left lower lung collapse/consolidation. Pneumonia not excluded. No pneumothorax. Degenerative changes throughout the spine. IMPRESSION: 1. Lower lung volumes with bibasilar atelectasis. 2. Left pleural effusion and left lower lung collapse/consolidation. Electronically Signed   By: Jerilynn Mages.  Shick M.D.   On: 05/18/2022 08:13        Scheduled Meds:  aspirin  975 mg Oral TID   cholecalciferol  2,000 Units Oral Daily   colchicine  0.6 mg Oral BID   enoxaparin (LOVENOX) injection  40 mg Subcutaneous Q24H   ferrous sulfate  325 mg Oral TID PC   folic acid  1 mg Oral Daily   insulin aspart  0-15 Units Subcutaneous TID WC   insulin aspart  0-5 Units Subcutaneous QHS   insulin glargine-yfgn  10 Units Subcutaneous QHS   pantoprazole  40 mg Oral Daily   Continuous  Infusions:  sodium chloride Stopped (05/18/22 1757)   azithromycin 500 mg (05/19/22 0931)   cefTRIAXone (ROCEPHIN)  IV 1 g (05/19/22 0853)     LOS: 1 day    Time spent: over 30 min    Fayrene Helper, MD [facility]   To contact the attending provider between 7A-7P or the covering provider during after hours 7P-7A, please log into the web site [email or web address] and access using universal [place] password for that web site. If you do not have the password, please call the hospital operator.  05/19/2022, 2:39 PM

## 2022-05-19 NOTE — Progress Notes (Signed)
Progress Note  Patient Name: Caitlyn Newman Date of Encounter: 05/19/2022  Primary Cardiologist: None  Subjective   No acute events overnight. Patient reports improving symptoms but has SOB with walking. Has minimal chest pressure but improved compared to the admission day. Telemetry reviewed, sinus tachycardia and few PACs.  Inpatient Medications    Scheduled Meds:  aspirin  975 mg Oral TID   cholecalciferol  2,000 Units Oral Daily   colchicine  0.6 mg Oral BID   enoxaparin (LOVENOX) injection  40 mg Subcutaneous Q24H   ferrous sulfate  325 mg Oral TID PC   folic acid  1 mg Oral Daily   furosemide  20 mg Intravenous Once   insulin aspart  0-15 Units Subcutaneous TID WC   insulin aspart  0-5 Units Subcutaneous QHS   insulin glargine-yfgn  10 Units Subcutaneous QHS   pantoprazole  40 mg Oral Daily   Continuous Infusions:  sodium chloride Stopped (05/18/22 1757)   azithromycin 500 mg (05/19/22 0931)   cefTRIAXone (ROCEPHIN)  IV 1 g (05/19/22 0853)   PRN Meds: acetaminophen **OR** acetaminophen, albuterol, ondansetron **OR** ondansetron (ZOFRAN) IV   Vital Signs    Vitals:   05/18/22 1940 05/19/22 0002 05/19/22 0505 05/19/22 0820  BP: (!) 150/87 120/74 (!) 153/74 137/80  Pulse: (!) 106 100 (!) 107 (!) 114  Resp: 16 18 20 20  $ Temp: 98.6 F (37 C) 98.8 F (37.1 C) 99.1 F (37.3 C)   TempSrc: Oral Oral Oral   SpO2: 95% 97% 94% 96%  Weight:   86 kg   Height:        Intake/Output Summary (Last 24 hours) at 05/19/2022 1106 Last data filed at 05/18/2022 1711 Gross per 24 hour  Intake 855.08 ml  Output --  Net 855.08 ml   Filed Weights   05/18/22 0752 05/19/22 0505  Weight: 90.7 kg 86 kg    Telemetry     Personally reviewed, sinus tachycardia with PACs  ECG    Sinus tachycardia  Physical Exam   GEN: No acute distress.   Neck: JVD elevated till half way of neck Cardiac: RRR, no murmur, rub, or gallop.  Respiratory: Nonlabored. Clear to auscultation  bilaterally. GI: Soft, nontender, bowel sounds present. MS: Trace pitting edema; No deformity. Neuro:  Nonfocal. Psych: Alert and oriented x 3. Normal affect.  Labs    Chemistry Recent Labs  Lab 05/18/22 0811 05/19/22 0140  NA 134* 135  K 4.3 4.2  CL 99 104  CO2 24 24  GLUCOSE 355* 128*  BUN 16 12  CREATININE 1.07* 0.84  CALCIUM 8.4* 7.8*  GFRNONAA 56* >60  ANIONGAP 11 7     Hematology Recent Labs  Lab 05/18/22 0811 05/19/22 0140  WBC 22.6* 17.9*  RBC 3.97 3.50*  HGB 12.4 11.2*  HCT 38.2 33.2*  MCV 96.2 94.9  MCH 31.2 32.0  MCHC 32.5 33.7  RDW 14.0 14.2  PLT 294 232    Cardiac EnzymesNo results for input(s): "TROPONINIHS" in the last 720 hours.  BNP Recent Labs  Lab 05/18/22 1601  BNP 196.0*     DDimerNo results for input(s): "DDIMER" in the last 168 hours.   Radiology    ECHOCARDIOGRAM COMPLETE  Result Date: 05/18/2022    ECHOCARDIOGRAM REPORT   Patient Name:   Caitlyn Newman Date of Exam: 05/18/2022 Medical Rec #:  [id number]        Height:       64.0 in Accession #:    [id number]  Weight:       200.0 lb Date of Birth:  1951/03/26       BSA:          1.956 m Patient Age:    72 years         BP:           153/83 mmHg Patient Gender: F                HR:           100 bpm. Exam Location:  [facility] Procedure: 2D Echo, Color Doppler, Cardiac Doppler and Intracardiac            Opacification Agent STAT ECHO Indications:    Pericardial effusion  History:        Patient has no prior history of Echocardiogram examinations.                 Signs/Symptoms:Shortness of Breath, Fever and Chest Pain; Risk                 Factors:Hypertension.  Sonographer:    Eartha Inch Referring Phys: [phone] DAVID TAT  Sonographer Comments: Technically difficult study due to poor echo windows and patient is obese. Image acquisition challenging due to patient body habitus and Image acquisition challenging due to respiratory motion. IMPRESSIONS  1. Poor echo windows  2. Moderate  pericardial effusion. The pericardial effusion is lateral to the left ventricle and anterior to the right ventricle. Moderate pleural effusion in the left lateral region. Right atrium and right ventricle not well visualized to definitely rule out cardiac tampoande. IVC not well visualized.  3. Challenging study due to tachycardia. Left ventricular ejection fraction, by estimation, is 55 to 60%. The left ventricle has normal function. The left ventricle has no regional wall motion abnormalities. Left ventricular diastolic function could not  be evaluated.  4. Right ventricular systolic function was not well visualized. The right ventricular size is not well visualized. Tricuspid regurgitation signal is inadequate for assessing PA pressure.  5. The mitral valve is normal in structure. No evidence of mitral valve regurgitation. No evidence of mitral stenosis.  6. The aortic valve is tricuspid. Aortic valve regurgitation is not visualized. No aortic stenosis is present. Comparison(s): No prior Echocardiogram. FINDINGS  Left Ventricle: Left ventricular ejection fraction, by estimation, is 55 to 60%. The left ventricle has normal function. The left ventricle has no regional wall motion abnormalities. Definity contrast agent was given IV to delineate the left ventricular  endocardial borders. The left ventricular internal cavity size was normal in size. There is no left ventricular hypertrophy. Left ventricular diastolic function could not be evaluated. Right Ventricle: The right ventricular size is not well visualized. Right vetricular wall thickness was not well visualized. Right ventricular systolic function was not well visualized. Tricuspid regurgitation signal is inadequate for assessing PA pressure. Left Atrium: Left atrial size was normal in size. Right Atrium: Right atrial size was normal in size. Pericardium: A moderately sized pericardial effusion is present. The pericardial effusion is lateral to the left  ventricle and anterior to the right ventricle. Mitral Valve: The mitral valve is normal in structure. No evidence of mitral valve regurgitation. No evidence of mitral valve stenosis. Tricuspid Valve: The tricuspid valve is not well visualized. Tricuspid valve regurgitation is not demonstrated. No evidence of tricuspid stenosis. Aortic Valve: The aortic valve is tricuspid. Aortic valve regurgitation is not visualized. No aortic stenosis is present. Pulmonic Valve: The pulmonic valve was not  well visualized. Pulmonic valve regurgitation is not visualized. No evidence of pulmonic stenosis. Aorta: The aortic root and ascending aorta are structurally normal, with no evidence of dilitation. Venous: The inferior vena cava was not well visualized. IAS/Shunts: No atrial level shunt detected by color flow Doppler. Additional Comments: There is a moderate pleural effusion in the left lateral region.  LEFT VENTRICLE PLAX 2D LVIDd:         3.00 cm LVIDs:         2.40 cm LV PW:         1.10 cm LV IVS:        0.90 cm LVOT diam:     2.00 cm LVOT Area:     3.14 cm  LEFT ATRIUM             Index        RIGHT ATRIUM           Index LA diam:        3.00 cm 1.53 cm/m   RA Area:     18.20 cm LA Vol (A2C):   59.7 ml 30.52 ml/m  RA Volume:   44.60 ml  22.80 ml/m LA Vol (A4C):   49.2 ml 25.15 ml/m LA Biplane Vol: 54.4 ml 27.81 ml/m   AORTA Ao Root diam: 3.20 cm Ao Asc diam:  3.00 cm  SHUNTS Systemic Diam: 2.00 cm Alisah Grandberry Priya Secilia Apps Electronically signed by Lorelee Cover Anyah Swallow Signature Date/Time: 05/18/2022/4:52:38 PM    Final    CT Chest W Contrast  Result Date: 05/18/2022 CLINICAL DATA:  72 year old female with shortness of breath, cough and chills. EXAM: CT CHEST WITH CONTRAST TECHNIQUE: Multidetector CT imaging of the chest was performed during intravenous contrast administration. RADIATION DOSE REDUCTION: This exam was performed according to the departmental dose-optimization program which includes automated exposure  control, adjustment of the mA and/or kV according to patient size and/or use of iterative reconstruction technique. CONTRAST:  68m OMNIPAQUE IOHEXOL 300 MG/ML  SOLN COMPARISON:  05/18/2022 and prior chest radiographs FINDINGS: Cardiovascular: A moderate to large pericardial effusion is identified with possible mild pericardial enhancement. UPPER limits normal heart size identified. There is no evidence of thoracic aortic aneurysm. Mediastinum/Nodes: No enlarged mediastinal, hilar, or axillary lymph nodes. Thyroid gland, trachea, and esophagus demonstrate no significant findings. Lungs/Pleura: A moderate LEFT pleural effusion is noted which may be loculated. A small RIGHT pleural effusion is identified. Moderate to extensive lingular and LEFT LOWER lobe atelectasis noted. Moderate RIGHT basilar atelectasis is identified. There is no evidence of pneumothorax. Upper Abdomen: An indeterminate 1.7 cm hyperdense/enhancing lesion within the posterior RIGHT liver (image 130: Series 2) is noted. An indeterminate 2 cm LEFT adrenal nodule is identified. Musculoskeletal: No acute or suspicious bony abnormalities are noted. Moderate facet arthropathy in the LOWER thoracic and UPPER lumbar spine identified. IMPRESSION: 1. Moderate to large pericardial effusion with possible mild pericardial enhancement, suggesting exudative process. 2. Moderate LEFT pleural effusion which may be loculated. Small RIGHT pleural effusion. 3. Moderate to extensive lingular and LEFT LOWER lobe atelectasis. Moderate RIGHT basilar atelectasis. Superimposed infection/pneumonia are not excluded in these areas. 4. Indeterminate 1.7 cm hyperdense/enhancing lesion within the posterior RIGHT liver and indeterminate 2 cm LEFT adrenal nodule. Elective MRI of the abdomen with and without contrast recommended for further evaluation. Critical Value/emergent results were called by telephone at the time of interpretation on 05/18/2022 at 11:36 am to provider SFredia Sorrow, who verbally acknowledged these results. Electronically Signed   By:  Margarette Canada M.D.   On: 05/18/2022 11:40   DG Chest Port 1 View  Result Date: 05/18/2022 CLINICAL DATA:  Shortness of breath EXAM: PORTABLE CHEST 1 VIEW COMPARISON:  10/28/2013 FINDINGS: Overall lower lung volumes with bibasilar atelectasis. Heart is enlarged. Left pleural effusion noted with left lower lung collapse/consolidation. Pneumonia not excluded. No pneumothorax. Degenerative changes throughout the spine. IMPRESSION: 1. Lower lung volumes with bibasilar atelectasis. 2. Left pleural effusion and left lower lung collapse/consolidation. Electronically Signed   By: Jerilynn Mages.  Shick M.D.   On: 05/18/2022 08:13    Cardiac Studies  Echocardiogram from 05/18/2022 Moderate medical effusion overlying RV and LV, inconclusive evidence for tamponade. RA and RV chambers are not well-visualized. IVC suboptimal subcostal window. LVEF appears grossly normal.   Assessment & Plan   Patient is a 72 year old F known to have rheumatoid arthritis on methotrexate presented to ER with 1 week history of SOB, chest discomfort and fever, chills. She is currently admitted to the hospitalist team for the management of acute pericarditis and pneumonia.  # Acute pericarditis with moderate pericardial fusion and inconclusive evidence of pericardial tamponade -Patient has acute onset of SOB, fever, chills x 1 week associated with improvement in chest pain symptoms while sitting up and worse with supine position. EKG showed PR depression and subtle ST elevations in inferior/lateral leads. Echo showed moderate pericardial effusion with inconclusive evidence of tamponade. CT chest showed pericardial enhancement.  CRP elevated. Patient meets criteria for acute pericarditis.  -Continue aspirin 1000 mg 8 hours x 2 weeks followed by tapering aspirin by 500 mg every 1 week. Continue pantoprazole 40 mg for prophylaxis. -Continue colchicine 0.6 mg twice daily x 3  months -Obtain 2D echocardiogram, limited to rule out cardiac tamponade -Patient has been having SOB with mild lower extremity swelling.  She is will administer IV Lasix 20 mg and assess response.  # Left pleural effusion with overlying suspected pneumonia/infection -Continue antibiotics, management per primary team  # Rheumatoid arthritis -Management per primary team   Signed, Chalmers Guest, MD  05/19/2022, 11:06 AM

## 2022-05-19 NOTE — Consult Note (Addendum)
NAME:  Caitlyn Newman, MRN:  [id number], DOB:  July 12, 1950, LOS: 1 ADMISSION DATE:  05/18/2022, CONSULTATION DATE:  05/19/2022 REFERRING MD:  Dr. Florene Glen, CHIEF COMPLAINT: Left Pleural Effusion    History of Present Illness:  72 year old female with PMH rheumatoid arthritis, HTN, DM. Presents to ED on 2/9 with reported one week of fever, chills, shortness of breath, cough with yellow sputum production, chest discomfort. Patient states she has not seen PCP in over 5 years.   On arrival to ED temp 99.1, HR 120s, oxygen saturation 89% on room air. WBC 22.6. Lactic acid 2.7. CT chest with moderate to large pericardial effusion with mild pericardial enhancement concerning for exudative process, moderate left and small right pleural effusions. Cardiology consulted. Recommended treatment for acute pericarditis with aspirin, PPI, colchicine. 2/10 Pulmonary consulted for recommendations for left pleural effusion   Pertinent  Medical History  RA on methotrexate, HTN, DM    Significant Hospital Events: Including procedures, antibiotic start and stop dates in addition to other pertinent events   2/9 Presents to ED   Interim History / Subjective:  This AM patient on room air. Oxygen saturation 98%. Reports continued cough and shortness of breath while ambulating   Objective   Blood pressure 139/73, pulse (!) 104, temperature 98.9 F (37.2 C), temperature source Axillary, resp. rate 18, height 5' 4"$  (1.626 m), weight 86 kg, SpO2 93 %.        Intake/Output Summary (Last 24 hours) at 05/19/2022 1157 Last data filed at 05/18/2022 1711 Gross per 24 hour  Intake 855.08 ml  Output --  Net 855.08 ml   Filed Weights   05/18/22 0752 05/19/22 0505  Weight: 90.7 kg 86 kg    Examination: General: older adult female, lying in bed, no distress noted  HENT: Dry MM  Lungs: diminished breath sounds, no use of accessory muscles  Cardiovascular: RRR, HR 98, muffled heart sounds   Abdomen: obese, soft, active  bowel sounds  Extremities: -edema Neuro: alert, oriented, follows commands  GU: deffered   Resolved Hospital Problem list     Assessment & Plan:   Loculated left pleural effusion, suspect infectious in nature  Plan -Titrate supplemental oxygen for saturation goal >92 -Encourage good pulmonary hygiene  -Send MRSA PCR, Strep P, Legionella, RVP, Fungitell/Aspergillus (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84  CALCIUM 8.4* 7.8*  MG  --  1.9   GFR: Estimated Creatinine Clearance: 67.1 mL/min (by C-G formula based on SCr of 0.84 mg/dL). Recent Labs  Lab 05/18/22 0811 05/18/22 0932 05/18/22 1021 05/18/22 1601 05/19/22 0140  PROCALCITON  --   --   --  0.62  --   WBC 22.6*  --   --   --  17.9*  LATICACIDVEN  --  2.7*  1.8  --   --     Liver Function Tests: No results for input(s): "AST", "ALT", "ALKPHOS", "BILITOT", "PROT", "ALBUMIN" in the last 168 hours. No results for input(s): "LIPASE", "AMYLASE" in the last 168 hours. No results for input(s): "AMMONIA" in the last 168 hours.  ABG    Component Value Date/Time   HCO3 28.4 (H) 05/18/2022 0932   O2SAT 80 05/18/2022 0932     Coagulation Profile: No results for input(s): "INR", "PROTIME" in the last 168 hours.  Cardiac Enzymes: No results for input(s): "CKTOTAL", "CKMB", "CKMBINDEX", "TROPONINI" in the last  168 hours.  HbA1C: Hgb A1c MFr Bld  Date/Time Value Ref Range Status  05/18/2022 04:01 PM 5.9 (H) 4.8 - 5.6 % Final    Comment:    (NOTE) Pre diabetes:          5.7%-6.4%  Diabetes:              >6.4%  Glycemic control for   <7.0% adults with diabetes     CBG: Recent Labs  Lab 05/18/22 1523 05/18/22 1749 05/18/22 2210 05/19/22 0815 05/19/22 1111  GLUCAP 119* 106* 140* 151* 126*    Review of Systems:   +Cough with sputum production, +SOB, denies chest pain   Past Medical History:  She,  has a past medical history of Anemia, Arthritis, Gout, Pneumonia, and Wears glasses.   Surgical History:   Past Surgical History:  Procedure Laterality Date   COLONOSCOPY W/ POLYPECTOMY     LYMPH NODE BIOPSY Right 01/13/2013   Procedure: LYMPH NODE BIOPSY RIGHT GROIN;  Surgeon: Joyice Faster. Cornett, MD;  Location: [facility];  Service: General;  Laterality: Right;   NO PAST SURGERIES     TOTAL KNEE ARTHROPLASTY Right 11/24/2013   Procedure: RIGHT TOTAL KNEE ARTHROPLASTY;  Surgeon: Mauri Pole, MD;  Location: [facility];  Service: Orthopedics;  Laterality: Right;     Social History:   reports that she has never smoked. She has never used smokeless tobacco. She reports that she does not drink alcohol and does not use drugs.   Family History:  Her family history includes Diabetes in her father and mother; Hypertension in her father and mother; Kidney disease in her mother.   Allergies Allergies  Allergen Reactions   Cortisone     Other reaction(s): Rash   Penicillins     Other reaction(s): Other Unknown     Home Medications  Prior to Admission medications   Medication Sig Start Date End Date Taking? Authorizing Provider  Cholecalciferol (VITAMIN D) 2000 UNITS CAPS Take 1 capsule by mouth daily.    Yes [provider]  ferrous sulfate 325 (65 FE) MG tablet Take 1 tablet (325 mg total) by mouth 3 (three) times daily after meals. 11/25/13  Yes Babish,  Rodman Key, PA-C  folic acid (FOLVITE) 1 MG tablet Take 1 mg by mouth daily.   Yes [provider]  methotrexate (RHEUMATREX) 2.5 MG tablet Take 10 mg by mouth 2 (two) times a week. Wednesdays and Thursdays   Yes [provider]     Time Spent: 52 minutes    Hayden Pedro, AGACNP-BC [facility]  PCCM Pgr: [phone]

## 2022-05-20 DIAGNOSIS — J90 Pleural effusion, not elsewhere classified: Secondary | ICD-10-CM | POA: Diagnosis not present

## 2022-05-20 DIAGNOSIS — M069 Rheumatoid arthritis, unspecified: Secondary | ICD-10-CM | POA: Diagnosis not present

## 2022-05-20 DIAGNOSIS — I5031 Acute diastolic (congestive) heart failure: Secondary | ICD-10-CM

## 2022-05-20 DIAGNOSIS — J189 Pneumonia, unspecified organism: Secondary | ICD-10-CM | POA: Diagnosis not present

## 2022-05-20 DIAGNOSIS — I300 Acute nonspecific idiopathic pericarditis: Secondary | ICD-10-CM | POA: Diagnosis not present

## 2022-05-20 DIAGNOSIS — I319 Disease of pericardium, unspecified: Secondary | ICD-10-CM | POA: Diagnosis not present

## 2022-05-20 LAB — CBC WITH DIFFERENTIAL/PLATELET
Abs Immature Granulocytes: 0.07 10*3/uL (ref 0.00–0.07)
Basophils Absolute: 0.1 10*3/uL (ref 0.0–0.1)
Basophils Relative: 1 %
Eosinophils Absolute: 0.2 10*3/uL (ref 0.0–0.5)
Eosinophils Relative: 2 %
HCT: 32.6 % — ABNORMAL LOW (ref 36.0–46.0)
Hemoglobin: 11.1 g/dL — ABNORMAL LOW (ref 12.0–15.0)
Immature Granulocytes: 1 %
Lymphocytes Relative: 14 %
Lymphs Abs: 2 10*3/uL (ref 0.7–4.0)
MCH: 32 pg (ref 26.0–34.0)
MCHC: 34 g/dL (ref 30.0–36.0)
MCV: 93.9 fL (ref 80.0–100.0)
Monocytes Absolute: 0.1 10*3/uL (ref 0.1–1.0)
Monocytes Relative: 1 %
Neutro Abs: 11.5 10*3/uL — ABNORMAL HIGH (ref 1.7–7.7)
Neutrophils Relative %: 81 %
Platelets: 242 10*3/uL (ref 150–400)
RBC: 3.47 MIL/uL — ABNORMAL LOW (ref 3.87–5.11)
RDW: 14.2 % (ref 11.5–15.5)
WBC: 14 10*3/uL — ABNORMAL HIGH (ref 4.0–10.5)
nRBC: 0 % (ref 0.0–0.2)

## 2022-05-20 LAB — COMPREHENSIVE METABOLIC PANEL
ALT: 29 U/L (ref 0–44)
AST: 24 U/L (ref 15–41)
Albumin: 2.2 g/dL — ABNORMAL LOW (ref 3.5–5.0)
Alkaline Phosphatase: 70 U/L (ref 38–126)
Anion gap: 10 (ref 5–15)
BUN: 14 mg/dL (ref 8–23)
CO2: 26 mmol/L (ref 22–32)
Calcium: 7.9 mg/dL — ABNORMAL LOW (ref 8.9–10.3)
Chloride: 102 mmol/L (ref 98–111)
Creatinine, Ser: 0.86 mg/dL (ref 0.44–1.00)
GFR, Estimated: >60 mL/min (ref >60)
Glucose, Bld: 93 mg/dL (ref 70–99)
Potassium: 3.3 mmol/L — ABNORMAL LOW (ref 3.5–5.1)
Sodium: 138 mmol/L (ref 135–145)
Total Bilirubin: 0.8 mg/dL (ref 0.3–1.2)
Total Protein: 5.5 g/dL — ABNORMAL LOW (ref 6.5–8.1)

## 2022-05-20 LAB — GLUCOSE, CAPILLARY
Glucose-Capillary: 72 mg/dL (ref 70–99)
Glucose-Capillary: 121 mg/dL — ABNORMAL HIGH (ref 70–99)
Glucose-Capillary: 94 mg/dL (ref 70–99)
Glucose-Capillary: 106 mg/dL — ABNORMAL HIGH (ref 70–99)

## 2022-05-20 LAB — MAGNESIUM: Magnesium: 1.8 mg/dL (ref 1.7–2.4)

## 2022-05-20 LAB — PHOSPHORUS: Phosphorus: 3.4 mg/dL (ref 2.5–4.6)

## 2022-05-20 MED ORDER — DILTIAZEM HCL-DEXTROSE 125-5 MG/125ML-% IV SOLN (PREMIX)
5.0000 mg/h | INTRAVENOUS | Status: DC
  Administered 2022-05-20: 5 mg/h via INTRAVENOUS
  Filled 2022-05-20: qty 125

## 2022-05-20 MED ORDER — POTASSIUM CHLORIDE CRYS ER 20 MEQ PO TBCR
40.0000 meq | EXTENDED_RELEASE_TABLET | ORAL | Status: AC
  Administered 2022-05-20 (×2): 40 meq via ORAL
  Filled 2022-05-20 (×2): qty 2

## 2022-05-20 MED ORDER — DILTIAZEM HCL 30 MG PO TABS
30.0000 mg | ORAL_TABLET | Freq: Four times a day (QID) | ORAL | Status: DC
  Administered 2022-05-20 – 2022-05-24 (×17): 30 mg via ORAL
  Filled 2022-05-20 (×17): qty 1

## 2022-05-20 MED ORDER — DILTIAZEM LOAD VIA INFUSION
10.0000 mg | Freq: Once | INTRAVENOUS | Status: AC
  Administered 2022-05-20: 10 mg via INTRAVENOUS
  Filled 2022-05-20: qty 10

## 2022-05-20 NOTE — Plan of Care (Signed)
Patient went into new onset ?atypical atrial flutter with RVR this morning, asymptomatic. Start Diltiazem drip @ 5 mg/hr. Hold AC until pericardial effusion completely resolves. Repeat Echo Limited in 2 days to re-evaluate pericardial effusion and r/o tamponade.  Minnie Legros Fidel Levy, MD [place]  9:25 AM

## 2022-05-20 NOTE — Plan of Care (Signed)

## 2022-05-20 NOTE — Progress Notes (Signed)
Mobility Specialist Progress Note:   05/20/22 0959  Mobility  Activity Contraindicated/medical hold   Pt inappropriate for mobility specialist at this time as advised by RN team. Pt currently in Afib. Mobility specialist to hold today, will continue to follow for readiness.   Andrey Campanile Mobility Specialist Please contact via SecureChat or  Rehab office at [phone]

## 2022-05-20 NOTE — Progress Notes (Addendum)
Progress Note  Patient Name: Caitlyn Newman Date of Encounter: 05/20/2022  Primary Cardiologist: None  Subjective   No acute events overnight. Patient walked in the hallway yesterday with improved symptoms of SOB. Otherwise denies any chest pressure.  Inpatient Medications    Scheduled Meds:  aspirin  975 mg Oral TID   cholecalciferol  2,000 Units Oral Daily   colchicine  0.6 mg Oral BID   enoxaparin (LOVENOX) injection  40 mg Subcutaneous Q24H   ferrous sulfate  325 mg Oral TID PC   folic acid  1 mg Oral Daily   furosemide  20 mg Intravenous BID   insulin aspart  0-15 Units Subcutaneous TID WC   insulin aspart  0-5 Units Subcutaneous QHS   insulin glargine-yfgn  10 Units Subcutaneous QHS   pantoprazole  40 mg Oral Daily   potassium chloride  40 mEq Oral Q4H   Continuous Infusions:  sodium chloride Stopped (05/18/22 1757)   azithromycin 500 mg (05/19/22 0931)   cefTRIAXone (ROCEPHIN)  IV 1 g (05/20/22 0807)   PRN Meds: acetaminophen **OR** acetaminophen, albuterol, ondansetron **OR** ondansetron (ZOFRAN) IV   Vital Signs    Vitals:   05/19/22 1703 05/19/22 1934 05/20/22 0425 05/20/22 0804  BP: 138/77 (!) 144/66 131/72 (!) 142/82  Pulse: 90 99 85 95  Resp: 17 18 16 16  $ Temp: 98.6 F (37 C) 97.9 F (36.6 C) 97.8 F (36.6 C)   TempSrc: Oral Oral Oral Oral  SpO2:  93% 91% 92%  Weight:   85.4 kg   Height:        Intake/Output Summary (Last 24 hours) at 05/20/2022 0808 Last data filed at 05/19/2022 2100 Gross per 24 hour  Intake --  Output 850 ml  Net -850 ml   Filed Weights   05/18/22 0752 05/19/22 0505 05/20/22 0425  Weight: 90.7 kg 86 kg 85.4 kg    Telemetry     Personally reviewed, HR controlled,80-90s.  ECG    EKG not performed today  Physical Exam   GEN: No acute distress.   Neck: JVD not elevated Cardiac: RRR, no murmur, rub, or gallop.  Respiratory: Nonlabored. Clear to auscultation bilaterally. GI: Soft, nontender, bowel sounds  present. MS: Trace pitting edema; No deformity. Neuro:  Nonfocal. Psych: Alert and oriented x 3. Normal affect.  Labs    Chemistry Recent Labs  Lab 05/18/22 0811 05/19/22 0140 05/20/22 0207  NA 134* 135 138  K 4.3 4.2 3.3*  CL 99 104 102  CO2 24 24 26  $ GLUCOSE 355* 128* 93  BUN 16 12 14  $ CREATININE 1.07* 0.84 0.86  CALCIUM 8.4* 7.8* 7.9*  PROT  --   --  5.5*  ALBUMIN  --   --  2.2*  AST  --   --  24  ALT  --   --  29  ALKPHOS  --   --  70  BILITOT  --   --  0.8  GFRNONAA 56* >60 >60  ANIONGAP 11 7 10     $ Hematology Recent Labs  Lab 05/18/22 0811 05/19/22 0140 05/20/22 0207  WBC 22.6* 17.9* 14.0*  RBC 3.97 3.50* 3.47*  HGB 12.4 11.2* 11.1*  HCT 38.2 33.2* 32.6*  MCV 96.2 94.9 93.9  MCH 31.2 32.0 32.0  MCHC 32.5 33.7 34.0  RDW 14.0 14.2 14.2  PLT 294 232 242    Cardiac EnzymesNo results for input(s): "TROPONINIHS" in the last 720 hours.  BNP Recent Labs  Lab 05/18/22 1601  BNP 196.0*     DDimerNo results for input(s): "DDIMER" in the last 168 hours.   Radiology    ECHOCARDIOGRAM LIMITED  Result Date: 05/19/2022    ECHOCARDIOGRAM LIMITED REPORT   Patient Name:   Caitlyn Newman Date of Exam: 05/19/2022 Medical Rec #:  [id number]        Height:       64.0 in Accession #:    [id number]       Weight:       189.6 lb Date of Birth:  1951-04-05       BSA:          1.912 m Patient Age:    72 years         BP:           139/73 mmHg Patient Gender: F                HR:           108 bpm. Exam Location:  Inpatient Procedure: Limited Echo, Cardiac Doppler and Limited Color Doppler Indications:    I31.3 Pericardial effusion (noninflammatory)  History:        Patient has prior history of Echocardiogram examinations, most                 recent 05/18/2022. Signs/Symptoms:Shortness of Breath; Risk                 Factors:Diabetes and Non-Smoker. Pericarditis; respiratory                 failure.  Sonographer:    Wilkie Aye RVT RCS Referring Phys: [id number] Lupe Handley P  Giulliana Mcroberts  Sonographer Comments: Technically challenging study due to limited acoustic windows, Technically difficult study due to poor echo windows, suboptimal subcostal window and suboptimal apical window. IMPRESSIONS  1. Limited Echo to r/o cardiac tamponade.  2. There is a moderate pericardial effusion anterior to the right ventricle and lateral to the left ventricle. Right atrial collapse in late diastole is noted. Right ventricle is not well visualized to definitely rule out tamponade. IVC is dilated and has <50% collapsibility. Clinical correlation is recommended. FINDINGS  Pericardium: There is a moderate pericardial effusion anterior to the right ventricle and lateral to the left ventricle. Right atrial collapse in late diastole is noted. Right ventricle is not well visualized to definitely rule out tamponade. IVC is dilated and has <50% collapsibility. Clinical correlation is recommended. RIGHT VENTRICLE          IVC RV Basal diam:  3.70 cm  IVC diam: 2.10 cm Caitlyn Newman Caitlyn Newman Electronically signed by Lorelee Cover Alvis Pulcini Signature Date/Time: 05/19/2022/3:51:02 PM    Final    ECHOCARDIOGRAM COMPLETE  Result Date: 05/18/2022    ECHOCARDIOGRAM REPORT   Patient Name:   Caitlyn Newman Date of Exam: 05/18/2022 Medical Rec #:  [id number]        Height:       64.0 in Accession #:    [id number]       Weight:       200.0 lb Date of Birth:  1950/09/26       BSA:          1.956 m Patient Age:    22 years         BP:           153/83 mmHg Patient Gender: F                HR:  100 bpm. Exam Location:  [facility] Procedure: 2D Echo, Color Doppler, Cardiac Doppler and Intracardiac            Opacification Agent STAT ECHO Indications:    Pericardial effusion  History:        Patient has no prior history of Echocardiogram examinations.                 Signs/Symptoms:Shortness of Breath, Fever and Chest Pain; Risk                 Factors:Hypertension.  Sonographer:    Eartha Inch Referring Phys:  [phone] DAVID TAT  Sonographer Comments: Technically difficult study due to poor echo windows and patient is obese. Image acquisition challenging due to patient body habitus and Image acquisition challenging due to respiratory motion. IMPRESSIONS  1. Poor echo windows  2. Moderate pericardial effusion. The pericardial effusion is lateral to the left ventricle and anterior to the right ventricle. Moderate pleural effusion in the left lateral region. Right atrium and right ventricle not well visualized to definitely rule out cardiac tampoande. IVC not well visualized.  3. Challenging study due to tachycardia. Left ventricular ejection fraction, by estimation, is 55 to 60%. The left ventricle has normal function. The left ventricle has no regional wall motion abnormalities. Left ventricular diastolic function could not  be evaluated.  4. Right ventricular systolic function was not well visualized. The right ventricular size is not well visualized. Tricuspid regurgitation signal is inadequate for assessing PA pressure.  5. The mitral valve is normal in structure. No evidence of mitral valve regurgitation. No evidence of mitral stenosis.  6. The aortic valve is tricuspid. Aortic valve regurgitation is not visualized. No aortic stenosis is present. Comparison(s): No prior Echocardiogram. FINDINGS  Left Ventricle: Left ventricular ejection fraction, by estimation, is 55 to 60%. The left ventricle has normal function. The left ventricle has no regional wall motion abnormalities. Definity contrast agent was given IV to delineate the left ventricular  endocardial borders. The left ventricular internal cavity size was normal in size. There is no left ventricular hypertrophy. Left ventricular diastolic function could not be evaluated. Right Ventricle: The right ventricular size is not well visualized. Right vetricular wall thickness was not well visualized. Right ventricular systolic function was not well visualized. Tricuspid  regurgitation signal is inadequate for assessing PA pressure. Left Atrium: Left atrial size was normal in size. Right Atrium: Right atrial size was normal in size. Pericardium: A moderately sized pericardial effusion is present. The pericardial effusion is lateral to the left ventricle and anterior to the right ventricle. Mitral Valve: The mitral valve is normal in structure. No evidence of mitral valve regurgitation. No evidence of mitral valve stenosis. Tricuspid Valve: The tricuspid valve is not well visualized. Tricuspid valve regurgitation is not demonstrated. No evidence of tricuspid stenosis. Aortic Valve: The aortic valve is tricuspid. Aortic valve regurgitation is not visualized. No aortic stenosis is present. Pulmonic Valve: The pulmonic valve was not well visualized. Pulmonic valve regurgitation is not visualized. No evidence of pulmonic stenosis. Aorta: The aortic root and ascending aorta are structurally normal, with no evidence of dilitation. Venous: The inferior vena cava was not well visualized. IAS/Shunts: No atrial level shunt detected by color flow Doppler. Additional Comments: There is a moderate pleural effusion in the left lateral region.  LEFT VENTRICLE PLAX 2D LVIDd:         3.00 cm LVIDs:         2.40 cm LV PW:  1.10 cm LV IVS:        0.90 cm LVOT diam:     2.00 cm LVOT Area:     3.14 cm  LEFT ATRIUM             Index        RIGHT ATRIUM           Index LA diam:        3.00 cm 1.53 cm/m   RA Area:     18.20 cm LA Vol (A2C):   59.7 ml 30.52 ml/m  RA Volume:   44.60 ml  22.80 ml/m LA Vol (A4C):   49.2 ml 25.15 ml/m LA Biplane Vol: 54.4 ml 27.81 ml/m   AORTA Ao Root diam: 3.20 cm Ao Asc diam:  3.00 cm  SHUNTS Systemic Diam: 2.00 cm Caitlyn Newman Caitlyn Newman Electronically signed by Lorelee Cover Hulan Szumski Signature Date/Time: 05/18/2022/4:52:38 PM    Final    CT Chest W Contrast  Result Date: 05/18/2022 CLINICAL DATA:  72 year old female with shortness of breath, cough and chills.  EXAM: CT CHEST WITH CONTRAST TECHNIQUE: Multidetector CT imaging of the chest was performed during intravenous contrast administration. RADIATION DOSE REDUCTION: This exam was performed according to the departmental dose-optimization program which includes automated exposure control, adjustment of the mA and/or kV according to patient size and/or use of iterative reconstruction technique. CONTRAST:  49m OMNIPAQUE IOHEXOL 300 MG/ML  SOLN COMPARISON:  05/18/2022 and prior chest radiographs FINDINGS: Cardiovascular: A moderate to large pericardial effusion is identified with possible mild pericardial enhancement. UPPER limits normal heart size identified. There is no evidence of thoracic aortic aneurysm. Mediastinum/Nodes: No enlarged mediastinal, hilar, or axillary lymph nodes. Thyroid gland, trachea, and esophagus demonstrate no significant findings. Lungs/Pleura: A moderate LEFT pleural effusion is noted which may be loculated. A small RIGHT pleural effusion is identified. Moderate to extensive lingular and LEFT LOWER lobe atelectasis noted. Moderate RIGHT basilar atelectasis is identified. There is no evidence of pneumothorax. Upper Abdomen: An indeterminate 1.7 cm hyperdense/enhancing lesion within the posterior RIGHT liver (image 130: Series 2) is noted. An indeterminate 2 cm LEFT adrenal nodule is identified. Musculoskeletal: No acute or suspicious bony abnormalities are noted. Moderate facet arthropathy in the LOWER thoracic and UPPER lumbar spine identified. IMPRESSION: 1. Moderate to large pericardial effusion with possible mild pericardial enhancement, suggesting exudative process. 2. Moderate LEFT pleural effusion which may be loculated. Small RIGHT pleural effusion. 3. Moderate to extensive lingular and LEFT LOWER lobe atelectasis. Moderate RIGHT basilar atelectasis. Superimposed infection/pneumonia are not excluded in these areas. 4. Indeterminate 1.7 cm hyperdense/enhancing lesion within the posterior  RIGHT liver and indeterminate 2 cm LEFT adrenal nodule. Elective MRI of the abdomen with and without contrast recommended for further evaluation. Critical Value/emergent results were called by telephone at the time of interpretation on 05/18/2022 at 11:36 am to provider SFredia Sorrow, who verbally acknowledged these results. Electronically Signed   By: JMargarette CanadaM.D.   On: 05/18/2022 11:40   DG Chest Port 1 View  Result Date: 05/18/2022 CLINICAL DATA:  Shortness of breath EXAM: PORTABLE CHEST 1 VIEW COMPARISON:  10/28/2013 FINDINGS: Overall lower lung volumes with bibasilar atelectasis. Heart is enlarged. Left pleural effusion noted with left lower lung collapse/consolidation. Pneumonia not excluded. No pneumothorax. Degenerative changes throughout the spine. IMPRESSION: 1. Lower lung volumes with bibasilar atelectasis. 2. Left pleural effusion and left lower lung collapse/consolidation. Electronically Signed   By: MJerilynn Mages  Shick M.D.   On: 05/18/2022 08:13  Cardiac Studies  Echocardiogram from 05/18/2022 Moderate medical effusion overlying RV and LV, inconclusive evidence for tamponade. RA and RV chambers are not well-visualized. IVC suboptimal subcostal window. LVEF appears grossly normal.   Assessment & Plan   Patient is a 72 year old F known to have rheumatoid arthritis on methotrexate presented to ER with 1 week history of SOB, chest discomfort and fever, chills. She is currently admitted to the hospitalist team for the management of acute pericarditis and pneumonia.  # Acute pericarditis with moderate pericardial fusion and inconclusive evidence of pericardial tamponade -Patient has acute onset of SOB, fever, chills x 1 week associated with improvement in chest pain symptoms while sitting up and worse with supine position. EKG showed PR depression subtle ST elevations in inferior/lateral leads. Echo showed moderate pericardial fusion with inconclusive evidence of tamponade. CT showed pericardial  enhancement. CRP and WBC elevated. Patient met criteria for acute pericarditis. -Echocardiogram showed right atrial diastolic collapse, RV not visualized and IVC dilated with less than 50% collapsibility. -Continue aspirin 1000 mg every 8 hours x 2 weeks followed by tapering aspirin by 500 mg every 1 week.  Continue pantoprazole 40 mg for prophylaxis. -Continue colchicine 0.6 mg twice daily for 3 months  # Acute diastolic heart failure, currently near compensated -Patient had significant relief in her symptoms of SOB after IV Lasix 20 mg twice daily. Will continue IV Lasix 20 mg twice daily for now and switch to p.o. Lasix tomorrow.  # Left pleural effusion with overlying suspected pneumonia/infection -Continue antibiotics, management per primary team  # Rheumatoid arthritis -Manage per primary team  I have spent a total of 33 minutes with patient reviewing chart , telemetry, EKGs, labs and examining patient as well as establishing an assessment and plan that was discussed with the patient.  > 50% of time was spent in direct patient care.      Signed, Jarika Robben Olene Craven, MD  05/20/2022, 8:08 AM

## 2022-05-20 NOTE — Progress Notes (Signed)
PROGRESS NOTE    Caitlyn Newman  [id number] DOB: [birth year] DOA: 05/18/2022 PCP: Chevis Pretty, FNP  Chief Complaint  Patient presents with   Shortness of Breath    Brief Narrative:   Caitlyn Newman is Caitlyn Newman 72 year old female with Caitlyn Newman history of rheumatoid arthritis and hypertension presenting with 1 week history of subjective fevers, chills, shortness of breath, cough, and chest discomfort.   She's been admitted with Caitlyn Shimabukuro pericardial effusion and concern for pneumonia with pleural effusions.  See below for additional details   Assessment & Plan:   Principal Problem:   Pericarditis Active Problems:   Class 1 obesity   Acute respiratory failure with hypoxia (HCC)   SIRS (systemic inflammatory response syndrome) (HCC)   Uncontrolled type 2 diabetes mellitus with hyperglycemia, without long-term current use of insulin (HCC)   Pleural effusion  Acute respiratory failure with hypoxia - resolved, due to below   Pericardial effusion  Pericarditis  -05/18/2022 CT chest--moderate to large pericardial effusion with mild pericardial enhancement - echo 2/9 unable to definitely rule out cardiac tamponade - repeat echo 2/10 with moderate pericardial effusion - RV not well visualized to definitively r/o tamponade - cardiology recommending aspirin 1000 mg q8 x2 weeks, followed by tapering aspirin 500 mg every week.  PPI daily.  Colchicine 0.6 mg BID x3 months. - lasix for evidence of overload per cards   Sepsis due to Community Acquired Pneumonia  Moderate Left Pleural Effusion and Small Right Effusion - tachycardia, leukocytosis, hypoxia at presentation with evidence of pneumonia on imaging - CXR with moderate L effusion which may be loculated, moderate to extensive lingular and LL lobe atelectasis, moderate R basilar atelectasis.  Superimposed infection/pneumonia are not excluded. - continue CAP coverage with ceftriaxone/azithromycin  - Follow blood cultures - sputum cx if able  to produce - serum fungitell, MRSA PCR negative.  Urine strep negative, urine legionella.  Aspergillus Ag.  RVP negative.   - negative covid, influenza, RSV - negative HIV - procalcitonin is 0.62, follow - PCCM c/s for pleural effusion, appreciate recs - plan for IR thora   Atrial Flutter - per cards - dilt gtt, hold anticoagulation until pericardial effusion resolves  - repeat limited echo in 2 days to reeval effusion and r/o tamponade  Diastolic HF - lasix per cards  Prediabetes -Check A1C 5.9 -NovoLog sliding scale -follow    Essential hypertension BP ok today, monitor to consider BP meds   Class I obesity Body mass index is 32.32 kg/m.   Rheumatoid arthritis -Patient takes methotrexate 10 mg on Wednesdays and Thursdays -Continue folic acid -Holding further methotrexate pending workup for her pericardial effusion and pleural effusion   Diarrhea - seems to have improved   Left Adrenal Nodule/Right Liver lesion -incidental findings on CT (indeterminate 1.7 cm hyperdense/enhancing lesion within the posterior right liver and indeterminate 2 cm L adrenal nodule) -needs an elective MRI of abdomen with and without contrast, can pursue this outpatient    DVT prophylaxis: lovenox Code Status: full Family Communication: brother at bedside, but he stepped out while I was in room  Disposition:   Status is: Inpatient Remains inpatient appropriate because: continued inpatient care   Consultants:  Cardiology PCCM  Procedures:  Echo IMPRESSIONS     1. Poor echo windows   2. Moderate pericardial effusion. The pericardial effusion is lateral to  the left ventricle and anterior to the right ventricle. Moderate pleural  effusion in the left lateral region. Right atrium and right ventricle not  well visualized  to definitely  rule out cardiac tampoande. IVC not well visualized.   3. Challenging study due to tachycardia. Left ventricular ejection  fraction, by estimation,  is 55 to 60%. The left ventricle has normal  function. The left ventricle has no regional wall motion abnormalities.  Left ventricular diastolic function could not   be evaluated.   4. Right ventricular systolic function was not well visualized. The right  ventricular size is not well visualized. Tricuspid regurgitation signal is  inadequate for assessing PA pressure.   5. The mitral valve is normal in structure. No evidence of mitral valve  regurgitation. No evidence of mitral stenosis.   6. The aortic valve is tricuspid. Aortic valve regurgitation is not  visualized. No aortic stenosis is present.   Comparison(s): No prior Echocardiogram.    Antimicrobials:  Anti-infectives (From admission, onward)    Start     Dose/Rate Route Frequency Ordered Stop   05/19/22 1000  azithromycin (ZITHROMAX) 500 mg in sodium chloride 0.9 % 250 mL IVPB        500 mg 250 mL/hr over 60 Minutes Intravenous Every 24 hours 05/18/22 2336     05/19/22 1000  cefTRIAXone (ROCEPHIN) 1 g in sodium chloride 0.9 % 100 mL IVPB        1 g 200 mL/hr over 30 Minutes Intravenous Every 24 hours 05/18/22 2336     05/18/22 1145  cefTRIAXone (ROCEPHIN) 1 g in sodium chloride 0.9 % 100 mL IVPB        1 g 200 mL/hr over 30 Minutes Intravenous  Once 05/18/22 1141 05/18/22 1249   05/18/22 1145  azithromycin (ZITHROMAX) 500 mg in sodium chloride 0.9 % 250 mL IVPB        500 mg 250 mL/hr over 60 Minutes Intravenous  Once 05/18/22 1141 05/18/22 1711       Subjective: Feels better compared to when she had RVR  Objective: Vitals:   05/20/22 0950 05/20/22 1020 05/20/22 1050 05/20/22 1200  BP: 129/72 126/81 119/76 126/69  Pulse: (!) 111 (!) 106 (!) 107 87  Resp:      Temp:      TempSrc:      SpO2: 91% 92% 91% 92%  Weight:      Height:        Intake/Output Summary (Last 24 hours) at 05/20/2022 1545 Last data filed at 05/20/2022 1507 Gross per 24 hour  Intake 1491.58 ml  Output 850 ml  Net 641.58 ml   Filed  Weights   05/18/22 0752 05/19/22 0505 05/20/22 0425  Weight: 90.7 kg 86 kg 85.4 kg    Examination:  General: No acute distress. Cardiovascular: Heart sounds show Caitlyn Newman regular rate, and rhythm Lungs: Clear to auscultation bilaterally  Abdomen: Soft, nontender, nondistended  Neurological: Alert and oriented 3. Moves all extremities 4 with equal strength. Cranial nerves II through XII grossly intact.. Extremities: No clubbing or cyanosis. No edema. Data Reviewed: I have personally reviewed following labs and imaging studies  CBC: Recent Labs  Lab 05/18/22 0811 05/19/22 0140 05/20/22 0207  WBC 22.6* 17.9* 14.0*  NEUTROABS 21.2*  --  11.5*  HGB 12.4 11.2* 11.1*  HCT 38.2 33.2* 32.6*  MCV 96.2 94.9 93.9  PLT 294 232 [id number]    Basic Metabolic Panel: Recent Labs  Lab 05/18/22 0811 05/19/22 0140 05/20/22 0207  NA 134* 135 138  K 4.3 4.2 3.3*  CL 99 104 102  CO2 24 24 26  $ GLUCOSE 355* 128* 93  BUN 16 12  14  CREATININE 1.07* 0.84 0.86  CALCIUM 8.4* 7.8* 7.9*  MG  --  1.9 1.8  PHOS  --   --  3.4    GFR: Estimated Creatinine Clearance: 65.3 mL/min (by C-G formula based on SCr of 0.86 mg/dL).  Liver Function Tests: Recent Labs  Lab 05/20/22 0207  AST 24  ALT 29  ALKPHOS 70  BILITOT 0.8  PROT 5.5*  ALBUMIN 2.2*    CBG: Recent Labs  Lab 05/19/22 1111 05/19/22 1700 05/19/22 2101 05/20/22 0737 05/20/22 1135  GLUCAP 126* 79 95 72 121*     Recent Results (from the past 240 hour(s))  Resp panel by RT-PCR (RSV, Flu Caitlyn Newman&B, Covid) Anterior Nasal Swab     Status: None   Collection Time: 05/18/22  7:59 AM   Specimen: Anterior Nasal Swab  Result Value Ref Range Status   SARS Coronavirus 2 by RT PCR NEGATIVE NEGATIVE Final    Comment: (NOTE) SARS-CoV-2 target nucleic acids are NOT DETECTED.  The SARS-CoV-2 RNA is generally detectable in upper respiratory specimens during the acute phase of infection. The lowest concentration of SARS-CoV-2 viral copies this assay can  detect is 138 copies/mL. Finnis Colee negative result does not preclude SARS-Cov-2 infection and should not be used as the sole basis for treatment or other patient management decisions. Caitlyn Newman negative result may occur with  improper specimen collection/handling, submission of specimen other than nasopharyngeal swab, presence of viral mutation(s) within the areas targeted by this assay, and inadequate number of viral copies(<138 copies/mL). Caitlyn Newman negative result must be combined with clinical observations, patient history, and epidemiological information. The expected result is Negative.  Fact Sheet for Patients:  EntrepreneurPulse.com.au  Fact Sheet for Healthcare Providers:  IncredibleEmployment.be  This test is no t yet approved or cleared by the [place] FDA and  has been authorized for detection and/or diagnosis of SARS-CoV-2 by FDA under an Emergency Use Authorization (EUA). This EUA will remain  in effect (meaning this test can be used) for the duration of the COVID-19 declaration under Section 564(b)(1) of the Act, 21 U.S.C.section 360bbb-3(b)(1), unless the authorization is terminated  or revoked sooner.       Influenza Caitlyn Newman by PCR NEGATIVE NEGATIVE Final   Influenza B by PCR NEGATIVE NEGATIVE Final    Comment: (NOTE) The Xpert Xpress SARS-CoV-2/FLU/RSV plus assay is intended as an aid in the diagnosis of influenza from Nasopharyngeal swab specimens and should not be used as Caitlyn Newman sole basis for treatment. Nasal washings and aspirates are unacceptable for Xpert Xpress SARS-CoV-2/FLU/RSV testing.  Fact Sheet for Patients: EntrepreneurPulse.com.au  Fact Sheet for Healthcare Providers: IncredibleEmployment.be  This test is not yet approved or cleared by the [place] FDA and has been authorized for detection and/or diagnosis of SARS-CoV-2 by FDA under an Emergency Use Authorization (EUA). This EUA will remain in  effect (meaning this test can be used) for the duration of the COVID-19 declaration under Section 564(b)(1) of the Act, 21 U.S.C. section 360bbb-3(b)(1), unless the authorization is terminated or revoked.     Resp Syncytial Virus by PCR NEGATIVE NEGATIVE Final    Comment: (NOTE) Fact Sheet for Patients: EntrepreneurPulse.com.au  Fact Sheet for Healthcare Providers: IncredibleEmployment.be  This test is not yet approved or cleared by the [place] FDA and has been authorized for detection and/or diagnosis of SARS-CoV-2 by FDA under an Emergency Use Authorization (EUA). This EUA will remain in effect (meaning this test can be used) for the duration of the COVID-19 declaration under Section  564(b)(1) of the Act, 21 U.S.C. section 360bbb-3(b)(1), unless the authorization is terminated or revoked.  Performed at [facility], [address], [place], [place] [address]   Culture, blood (Routine X 2) w Reflex to ID Panel     Status: None (Preliminary result)   Collection Time: 05/18/22  9:32 AM   Specimen: BLOOD  Result Value Ref Range Status   Specimen Description BLOOD BLOOD RIGHT ARM  Final   Special Requests   Final    BOTTLES DRAWN AEROBIC AND ANAEROBIC Blood Culture adequate volume   Culture   Final    NO GROWTH 2 DAYS Performed at [facility], [address], [place], [place] [address]    Report Status PENDING  Incomplete  Culture, blood (Routine X 2) w Reflex to ID Panel     Status: None (Preliminary result)   Collection Time: 05/18/22  9:32 AM   Specimen: BLOOD  Result Value Ref Range Status   Specimen Description BLOOD BLOOD LEFT HAND  Final   Special Requests   Final    BOTTLES DRAWN AEROBIC AND ANAEROBIC Blood Culture results may not be optimal due to an excessive volume of blood received in culture bottles   Culture   Final    NO GROWTH 2 DAYS Performed at [facility], [address], [place], [place] [address]    Report Status  PENDING  Incomplete  Respiratory (~20 pathogens) panel by PCR     Status: None   Collection Time: 05/19/22  2:38 PM   Specimen: Nasopharyngeal Swab; Respiratory  Result Value Ref Range Status   Adenovirus NOT DETECTED NOT DETECTED Final   Coronavirus 229E NOT DETECTED NOT DETECTED Final    Comment: (NOTE) The Coronavirus on the Respiratory Panel, DOES NOT test for the novel  Coronavirus (2019 nCoV)    Coronavirus HKU1 NOT DETECTED NOT DETECTED Final   Coronavirus NL63 NOT DETECTED NOT DETECTED Final   Coronavirus OC43 NOT DETECTED NOT DETECTED Final   Metapneumovirus NOT DETECTED NOT DETECTED Final   Rhinovirus / Enterovirus NOT DETECTED NOT DETECTED Final   Influenza Giavonni Cizek NOT DETECTED NOT DETECTED Final   Influenza B NOT DETECTED NOT DETECTED Final   Parainfluenza Virus 1 NOT DETECTED NOT DETECTED Final   Parainfluenza Virus 2 NOT DETECTED NOT DETECTED Final   Parainfluenza Virus 3 NOT DETECTED NOT DETECTED Final   Parainfluenza Virus 4 NOT DETECTED NOT DETECTED Final   Respiratory Syncytial Virus NOT DETECTED NOT DETECTED Final   Bordetella pertussis NOT DETECTED NOT DETECTED Final   Bordetella Parapertussis NOT DETECTED NOT DETECTED Final   Chlamydophila pneumoniae NOT DETECTED NOT DETECTED Final   Mycoplasma pneumoniae NOT DETECTED NOT DETECTED Final    Comment: Performed at [facility], [place] [address], [place], [place] [address]  MRSA Next Gen by PCR, Nasal     Status: None   Collection Time: 05/19/22  2:38 PM   Specimen: Nasal Mucosa; Nasal Swab  Result Value Ref Range Status   MRSA by PCR Next Gen NOT DETECTED NOT DETECTED Final    Comment: (NOTE) The GeneXpert MRSA Assay (FDA approved for NASAL specimens only), is one component of Aarya Robinson comprehensive MRSA colonization surveillance program. It is not intended to diagnose MRSA infection nor to guide or monitor treatment for MRSA infections. Test performance is not FDA approved in patients less than 69 years old. Performed  at [facility], [place] [address], [place], [place] [address]  Radiology Studies: ECHOCARDIOGRAM LIMITED  Result Date: 05/19/2022    ECHOCARDIOGRAM LIMITED REPORT   Patient Name:   TALESIA GRIFFETH Date of Exam: 05/19/2022 Medical Rec #:  [id number]        Height:       64.0 in Accession #:    [id number]       Weight:       189.6 lb Date of Birth:  09/26/1950       BSA:          1.912 m Patient Age:    25 years         BP:           139/73 mmHg Patient Gender: F                HR:           108 bpm. Exam Location:  Inpatient Procedure: Limited Echo, Cardiac Doppler and Limited Color Doppler Indications:    I31.3 Pericardial effusion (noninflammatory)  History:        Patient has prior history of Echocardiogram examinations, most                 recent 05/18/2022. Signs/Symptoms:Shortness of Breath; Risk                 Factors:Diabetes and Non-Smoker. Pericarditis; respiratory                 failure.  Sonographer:    Wilkie Aye RVT RCS Referring Phys: [id number] VISHNU P MALLIPEDDI  Sonographer Comments: Technically challenging study due to limited acoustic windows, Technically difficult study due to poor echo windows, suboptimal subcostal window and suboptimal apical window. IMPRESSIONS  1. Limited Echo to r/o cardiac tamponade.  2. There is Caitlyn Newman moderate pericardial effusion anterior to the right ventricle and lateral to the left ventricle. Right atrial collapse in late diastole is noted. Right ventricle is not well visualized to definitely rule out tamponade. IVC is dilated and has <50% collapsibility. Clinical correlation is recommended. FINDINGS  Pericardium: There is Caitlyn Newman moderate pericardial effusion anterior to the right ventricle and lateral to the left ventricle. Right atrial collapse in late diastole is noted. Right ventricle is not well visualized to definitely rule out tamponade. IVC is dilated and has <50% collapsibility. Clinical correlation is recommended. RIGHT VENTRICLE          IVC  RV Basal diam:  3.70 cm  IVC diam: 2.10 cm Vishnu Priya Mallipeddi Electronically signed by Lorelee Cover Mallipeddi Signature Date/Time: 05/19/2022/3:51:02 PM    Final    ECHOCARDIOGRAM COMPLETE  Result Date: 05/18/2022    ECHOCARDIOGRAM REPORT   Patient Name:   Caitlyn Newman Date of Exam: 05/18/2022 Medical Rec #:  [id number]        Height:       64.0 in Accession #:    [id number]       Weight:       200.0 lb Date of Birth:  1950-07-14       BSA:          1.956 m Patient Age:    14 years         BP:           153/83 mmHg Patient Gender: F                HR:           100 bpm. Exam Location:  [facility] Procedure: 2D Echo, Color Doppler, Cardiac Doppler  and Intracardiac            Opacification Agent STAT ECHO Indications:    Pericardial effusion  History:        Patient has no prior history of Echocardiogram examinations.                 Signs/Symptoms:Shortness of Breath, Fever and Chest Pain; Risk                 Factors:Hypertension.  Sonographer:    Eartha Inch Referring Phys: [phone] DAVID TAT  Sonographer Comments: Technically difficult study due to poor echo windows and patient is obese. Image acquisition challenging due to patient body habitus and Image acquisition challenging due to respiratory motion. IMPRESSIONS  1. Poor echo windows  2. Moderate pericardial effusion. The pericardial effusion is lateral to the left ventricle and anterior to the right ventricle. Moderate pleural effusion in the left lateral region. Right atrium and right ventricle not well visualized to definitely rule out cardiac tampoande. IVC not well visualized.  3. Challenging study due to tachycardia. Left ventricular ejection fraction, by estimation, is 55 to 60%. The left ventricle has normal function. The left ventricle has no regional wall motion abnormalities. Left ventricular diastolic function could not  be evaluated.  4. Right ventricular systolic function was not well visualized. The right ventricular size is not well  visualized. Tricuspid regurgitation signal is inadequate for assessing PA pressure.  5. The mitral valve is normal in structure. No evidence of mitral valve regurgitation. No evidence of mitral stenosis.  6. The aortic valve is tricuspid. Aortic valve regurgitation is not visualized. No aortic stenosis is present. Comparison(s): No prior Echocardiogram. FINDINGS  Left Ventricle: Left ventricular ejection fraction, by estimation, is 55 to 60%. The left ventricle has normal function. The left ventricle has no regional wall motion abnormalities. Definity contrast agent was given IV to delineate the left ventricular  endocardial borders. The left ventricular internal cavity size was normal in size. There is no left ventricular hypertrophy. Left ventricular diastolic function could not be evaluated. Right Ventricle: The right ventricular size is not well visualized. Right vetricular wall thickness was not well visualized. Right ventricular systolic function was not well visualized. Tricuspid regurgitation signal is inadequate for assessing PA pressure. Left Atrium: Left atrial size was normal in size. Right Atrium: Right atrial size was normal in size. Pericardium: Caitlyn Newman moderately sized pericardial effusion is present. The pericardial effusion is lateral to the left ventricle and anterior to the right ventricle. Mitral Valve: The mitral valve is normal in structure. No evidence of mitral valve regurgitation. No evidence of mitral valve stenosis. Tricuspid Valve: The tricuspid valve is not well visualized. Tricuspid valve regurgitation is not demonstrated. No evidence of tricuspid stenosis. Aortic Valve: The aortic valve is tricuspid. Aortic valve regurgitation is not visualized. No aortic stenosis is present. Pulmonic Valve: The pulmonic valve was not well visualized. Pulmonic valve regurgitation is not visualized. No evidence of pulmonic stenosis. Aorta: The aortic root and ascending aorta are structurally normal, with no  evidence of dilitation. Venous: The inferior vena cava was not well visualized. IAS/Shunts: No atrial level shunt detected by color flow Doppler. Additional Comments: There is Caitlyn Newman moderate pleural effusion in the left lateral region.  LEFT VENTRICLE PLAX 2D LVIDd:         3.00 cm LVIDs:         2.40 cm LV PW:         1.10 cm LV IVS:  0.90 cm LVOT diam:     2.00 cm LVOT Area:     3.14 cm  LEFT ATRIUM             Index        RIGHT ATRIUM           Index LA diam:        3.00 cm 1.53 cm/m   RA Area:     18.20 cm LA Vol (A2C):   59.7 ml 30.52 ml/m  RA Volume:   44.60 ml  22.80 ml/m LA Vol (A4C):   49.2 ml 25.15 ml/m LA Biplane Vol: 54.4 ml 27.81 ml/m   AORTA Ao Root diam: 3.20 cm Ao Asc diam:  3.00 cm  SHUNTS Systemic Diam: 2.00 cm Vishnu Priya Mallipeddi Electronically signed by Lorelee Cover Mallipeddi Signature Date/Time: 05/18/2022/4:52:38 PM    Final         Scheduled Meds:  aspirin  975 mg Oral TID   cholecalciferol  2,000 Units Oral Daily   colchicine  0.6 mg Oral BID   diltiazem  30 mg Oral Q6H   enoxaparin (LOVENOX) injection  40 mg Subcutaneous Q24H   ferrous sulfate  325 mg Oral TID PC   folic acid  1 mg Oral Daily   furosemide  20 mg Intravenous BID   insulin aspart  0-15 Units Subcutaneous TID WC   insulin aspart  0-5 Units Subcutaneous QHS   insulin glargine-yfgn  10 Units Subcutaneous QHS   pantoprazole  40 mg Oral Daily   Continuous Infusions:  sodium chloride Stopped (05/18/22 1757)   azithromycin 500 mg (05/20/22 0845)   cefTRIAXone (ROCEPHIN)  IV 1 g (05/20/22 0807)   diltiazem (CARDIZEM) infusion Stopped (05/20/22 1507)     LOS: 2 days    Time spent: over 30 min    Fayrene Helper, MD [facility]   To contact the attending provider between 7A-7P or the covering provider during after hours 7P-7A, please log into the web site [email or web address] and access using universal  password for that web site. If you do not have the password, please call  the hospital operator.  05/20/2022, 3:45 PM

## 2022-05-20 NOTE — Progress Notes (Signed)
   05/20/22 0832  Assess: MEWS Score  BP 131/80  MAP (mmHg) 93  Pulse Rate (!) 138  ECG Heart Rate (!) 141  SpO2 93 %  O2 Device Room Air  Assess: MEWS Score  MEWS Temp 0  MEWS Systolic 0  MEWS Pulse 3  MEWS RR 0  MEWS LOC 0  MEWS Score 3  MEWS Score Color Yellow  Assess: if the MEWS score is Yellow or Red  Were vital signs taken at a resting state? Yes  Focused Assessment Change from prior assessment (see assessment flowsheet)  Does the patient meet 2 or more of the SIRS criteria? Yes  Does the patient have a confirmed or suspected source of infection? No  Provider and Rapid Response Notified? No  MEWS guidelines implemented  Yes, yellow  Treat  MEWS Interventions Considered administering scheduled or prn medications/treatments as ordered  Take Vital Signs  Increase Vital Sign Frequency  Yellow: Q2hr x1, continue Q4hrs until patient remains green for 12hrs  Escalate  MEWS: Escalate Yellow: Discuss with charge nurse and consider notifying provider and/or RRT  Notify: Charge Nurse/RN  Name of Charge Nurse/RN Notified Yoko  Provider Notification  Provider Name/Title Claudina Lick  Date Provider Notified 05/20/22  Time Provider Notified [phone]  Method of Notification Face-to-face  Notification Reason New onset of dysrhythmia  Provider response See new orders  Date of Provider Response 05/20/22  Time of Provider Response 0835  Assess: SIRS CRITERIA  SIRS Temperature  0  SIRS Pulse 1  SIRS Respirations  0  SIRS WBC 1  SIRS Score Sum  2

## 2022-05-21 ENCOUNTER — Inpatient Hospital Stay (HOSPITAL_COMMUNITY): Payer: Medicare HMO

## 2022-05-21 ENCOUNTER — Other Ambulatory Visit (HOSPITAL_COMMUNITY): Payer: Self-pay

## 2022-05-21 DIAGNOSIS — I319 Disease of pericardium, unspecified: Secondary | ICD-10-CM | POA: Diagnosis not present

## 2022-05-21 DIAGNOSIS — J90 Pleural effusion, not elsewhere classified: Secondary | ICD-10-CM | POA: Diagnosis not present

## 2022-05-21 DIAGNOSIS — I300 Acute nonspecific idiopathic pericarditis: Secondary | ICD-10-CM | POA: Diagnosis not present

## 2022-05-21 LAB — COMPREHENSIVE METABOLIC PANEL
ALT: 26 U/L (ref 0–44)
AST: 20 U/L (ref 15–41)
Albumin: 2.2 g/dL — ABNORMAL LOW (ref 3.5–5.0)
Alkaline Phosphatase: 71 U/L (ref 38–126)
Anion gap: 9 (ref 5–15)
BUN: 13 mg/dL (ref 8–23)
CO2: 24 mmol/L (ref 22–32)
Calcium: 7.9 mg/dL — ABNORMAL LOW (ref 8.9–10.3)
Chloride: 105 mmol/L (ref 98–111)
Creatinine, Ser: 0.83 mg/dL (ref 0.44–1.00)
GFR, Estimated: >60 mL/min (ref >60)
Glucose, Bld: 103 mg/dL — ABNORMAL HIGH (ref 70–99)
Potassium: 3.8 mmol/L (ref 3.5–5.1)
Sodium: 138 mmol/L (ref 135–145)
Total Bilirubin: 0.3 mg/dL (ref 0.3–1.2)
Total Protein: 5.4 g/dL — ABNORMAL LOW (ref 6.5–8.1)

## 2022-05-21 LAB — CBC WITH DIFFERENTIAL/PLATELET
Abs Immature Granulocytes: 0.1 10*3/uL — ABNORMAL HIGH (ref 0.00–0.07)
Basophils Absolute: 0.1 10*3/uL (ref 0.0–0.1)
Basophils Relative: 1 %
Eosinophils Absolute: 0.3 10*3/uL (ref 0.0–0.5)
Eosinophils Relative: 2 %
HCT: 31.7 % — ABNORMAL LOW (ref 36.0–46.0)
Hemoglobin: 10.9 g/dL — ABNORMAL LOW (ref 12.0–15.0)
Immature Granulocytes: 1 %
Lymphocytes Relative: 13 %
Lymphs Abs: 1.6 10*3/uL (ref 0.7–4.0)
MCH: 32 pg (ref 26.0–34.0)
MCHC: 34.4 g/dL (ref 30.0–36.0)
MCV: 93 fL (ref 80.0–100.0)
Monocytes Absolute: 0.3 10*3/uL (ref 0.1–1.0)
Monocytes Relative: 3 %
Neutro Abs: 10.5 10*3/uL — ABNORMAL HIGH (ref 1.7–7.7)
Neutrophils Relative %: 80 %
Platelets: 243 10*3/uL (ref 150–400)
RBC: 3.41 MIL/uL — ABNORMAL LOW (ref 3.87–5.11)
RDW: 14.1 % (ref 11.5–15.5)
WBC: 12.9 10*3/uL — ABNORMAL HIGH (ref 4.0–10.5)
nRBC: 0 % (ref 0.0–0.2)

## 2022-05-21 LAB — GLUCOSE, CAPILLARY: Glucose-Capillary: 93 mg/dL (ref 70–99)

## 2022-05-21 LAB — PHOSPHORUS: Phosphorus: 3.9 mg/dL (ref 2.5–4.6)

## 2022-05-21 LAB — MAGNESIUM: Magnesium: 1.7 mg/dL (ref 1.7–2.4)

## 2022-05-21 MED ORDER — ASPIRIN 325 MG PO TABS
650.0000 mg | ORAL_TABLET | Freq: Three times a day (TID) | ORAL | Status: DC
  Administered 2022-05-21 – 2022-05-24 (×10): 650 mg via ORAL
  Filled 2022-05-21 (×10): qty 2

## 2022-05-21 MED ORDER — POTASSIUM CHLORIDE CRYS ER 20 MEQ PO TBCR
40.0000 meq | EXTENDED_RELEASE_TABLET | Freq: Once | ORAL | Status: AC
  Administered 2022-05-21: 40 meq via ORAL
  Filled 2022-05-21: qty 2

## 2022-05-21 MED ORDER — MAGNESIUM SULFATE 2 GM/50ML IV SOLN
2.0000 g | Freq: Once | INTRAVENOUS | Status: AC
  Administered 2022-05-21: 2 g via INTRAVENOUS
  Filled 2022-05-21: qty 50

## 2022-05-21 MED ORDER — ASPIRIN 325 MG PO TABS: 650.0000 mg | ORAL_TABLET | Freq: Three times a day (TID) | ORAL | Status: DC

## 2022-05-21 MED ORDER — ASPIRIN 325 MG PO TABS: 325.0000 mg | ORAL_TABLET | Freq: Three times a day (TID) | ORAL | Status: DC

## 2022-05-21 NOTE — Progress Notes (Signed)
PROGRESS NOTE    Caitlyn Newman  [id number] DOB: Dec 27, 1950 DOA: 05/18/2022 PCP: Chevis Pretty, FNP  Chief Complaint  Patient presents with   Shortness of Breath    Brief Narrative:   Caitlyn Newman is Caitlyn Newman 72 year old female with Caitlyn Newman history of rheumatoid arthritis and hypertension presenting with 1 week history of subjective fevers, chills, shortness of breath, cough, and chest discomfort.   She's been admitted with Caitlyn Newman pericardial effusion and concern for pneumonia with pleural effusions.  See below for additional details   Assessment & Plan:   Principal Problem:   Pericarditis Active Problems:   Class 1 obesity   Acute respiratory failure with hypoxia (HCC)   SIRS (systemic inflammatory response syndrome) (HCC)   Uncontrolled type 2 diabetes mellitus with hyperglycemia, without long-term current use of insulin (HCC)   Pleural effusion  Acute respiratory failure with hypoxia - resolved, due to below   Pericardial effusion  Pericarditis  -05/18/2022 CT chest--moderate to large pericardial effusion with mild pericardial enhancement - echo 2/9 unable to definitely rule out cardiac tamponade - repeat echo 2/10 with moderate pericardial effusion - RV not well visualized to definitively r/o tamponade - cardiology recommending aspirin 1000 mg q8 x2 weeks, followed by tapering aspirin 500 mg every week.  PPI daily.  Colchicine 0.6 mg BID x3 months. - lasix for evidence of overload per cards   Sepsis due to Community Acquired Pneumonia  Moderate Left Pleural Effusion and Small Right Effusion - tachycardia, leukocytosis, hypoxia at presentation with evidence of pneumonia on imaging - CXR with moderate L effusion which may be loculated, moderate to extensive lingular and LL lobe atelectasis, moderate R basilar atelectasis.  Superimposed infection/pneumonia are not excluded. - continue CAP coverage with ceftriaxone/azithromycin  - Follow blood cultures - sputum cx if able  to produce - serum fungitell, MRSA PCR negative.  Urine strep negative, urine legionella.  Aspergillus Ag.  RVP negative.   - negative covid, influenza, RSV - negative HIV - procalcitonin is 0.62, follow - PCCM c/s for pleural effusion, appreciate recs - plan for IR thora   Atrial Flutter - per cards - dilt gtt, hold anticoagulation until pericardial effusion resolves  - may need outpatient cardiac monitor  Diastolic HF - lasix per cards - currently on hold   Prediabetes -Check A1C 5.9 -NovoLog sliding scale -follow    Essential hypertension BP ok today, monitor to consider BP meds   Class I obesity Body mass index is 32.32 kg/m.   Rheumatoid arthritis -Patient takes methotrexate 10 mg on Wednesdays and Thursdays -Continue folic acid -Holding further methotrexate pending workup for her pericardial effusion and pleural effusion   Diarrhea - seems to have improved   Left Adrenal Nodule/Right Liver lesion -incidental findings on CT (indeterminate 1.7 cm hyperdense/enhancing lesion within the posterior right liver and indeterminate 2 cm L adrenal nodule) -needs an elective MRI of abdomen with and without contrast, can pursue this outpatient    DVT prophylaxis: lovenox Code Status: full Family Communication: sister, brother, sister in law at bedside Disposition:   Status is: Inpatient Remains inpatient appropriate because: continued inpatient care   Consultants:  Cardiology PCCM  Procedures:  Echo IMPRESSIONS     1. Poor echo windows   2. Moderate pericardial effusion. The pericardial effusion is lateral to  the left ventricle and anterior to the right ventricle. Moderate pleural  effusion in the left lateral region. Right atrium and right ventricle not  well visualized to definitely  rule out cardiac tampoande. IVC  not well visualized.   3. Challenging study due to tachycardia. Left ventricular ejection  fraction, by estimation, is 55 to 60%. The left  ventricle has normal  function. The left ventricle has no regional wall motion abnormalities.  Left ventricular diastolic function could not   be evaluated.   4. Right ventricular systolic function was not well visualized. The right  ventricular size is not well visualized. Tricuspid regurgitation signal is  inadequate for assessing PA pressure.   5. The mitral valve is normal in structure. No evidence of mitral valve  regurgitation. No evidence of mitral stenosis.   6. The aortic valve is tricuspid. Aortic valve regurgitation is not  visualized. No aortic stenosis is present.   Comparison(s): No prior Echocardiogram.    Antimicrobials:  Anti-infectives (From admission, onward)    Start     Dose/Rate Route Frequency Ordered Stop   05/19/22 1000  azithromycin (ZITHROMAX) 500 mg in sodium chloride 0.9 % 250 mL IVPB        500 mg 250 mL/hr over 60 Minutes Intravenous Every 24 hours 05/18/22 2336     05/19/22 1000  cefTRIAXone (ROCEPHIN) 1 g in sodium chloride 0.9 % 100 mL IVPB        1 g 200 mL/hr over 30 Minutes Intravenous Every 24 hours 05/18/22 2336     05/18/22 1145  cefTRIAXone (ROCEPHIN) 1 g in sodium chloride 0.9 % 100 mL IVPB        1 g 200 mL/hr over 30 Minutes Intravenous  Once 05/18/22 1141 05/18/22 1249   05/18/22 1145  azithromycin (ZITHROMAX) 500 mg in sodium chloride 0.9 % 250 mL IVPB        500 mg 250 mL/hr over 60 Minutes Intravenous  Once 05/18/22 1141 05/18/22 1711       Subjective: No complaints Family at bedside  Objective: Vitals:   05/20/22 0950 05/20/22 1020 05/20/22 1050 05/20/22 1200  BP: 129/72 126/81 119/76 126/69  Pulse: (!) 111 (!) 106 (!) 107 87  Resp:      Temp:      TempSrc:      SpO2: 91% 92% 91% 92%  Weight:      Height:        Intake/Output Summary (Last 24 hours) at 05/20/2022 1545 Last data filed at 05/20/2022 1507 Gross per 24 hour  Intake 1491.58 ml  Output 850 ml  Net 641.58 ml   Filed Weights   05/18/22 0752 05/19/22  0505 05/20/22 0425  Weight: 90.7 kg 86 kg 85.4 kg    Examination:  General: No acute distress. Cardiovascular: RRR Lungs: unlabored Abdomen: Soft, nontender, nondistended  Neurological: Alert and oriented 3. Moves all extremities 4 with equal strength. Cranial nerves II through XII grossly intact. Extremities: No clubbing or cyanosis. No edema.   Data Reviewed: I have personally reviewed following labs and imaging studies  CBC: Recent Labs  Lab 05/18/22 0811 05/19/22 0140 05/20/22 0207  WBC 22.6* 17.9* 14.0*  NEUTROABS 21.2*  --  11.5*  HGB 12.4 11.2* 11.1*  HCT 38.2 33.2* 32.6*  MCV 96.2 94.9 93.9  PLT 294 232 [id number]    Basic Metabolic Panel: Recent Labs  Lab 05/18/22 0811 05/19/22 0140 05/20/22 0207  NA 134* 135 138  K 4.3 4.2 3.3*  CL 99 104 102  CO2 24 24 26  $ GLUCOSE 355* 128* 93  BUN 16 12 14  $ CREATININE 1.07* 0.84 0.86  CALCIUM 8.4* 7.8* 7.9*  MG  --  1.9 1.8  PHOS  --   --  3.4    GFR: Estimated Creatinine Clearance: 65.3 mL/min (by C-G formula based on SCr of 0.86 mg/dL).  Liver Function Tests: Recent Labs  Lab 05/20/22 0207  AST 24  ALT 29  ALKPHOS 70  BILITOT 0.8  PROT 5.5*  ALBUMIN 2.2*    CBG: Recent Labs  Lab 05/19/22 1111 05/19/22 1700 05/19/22 2101 05/20/22 0737 05/20/22 1135  GLUCAP 126* 79 95 72 121*     Recent Results (from the past 240 hour(s))  Resp panel by RT-PCR (RSV, Flu Jamesrobert Ohanesian&B, Covid) Anterior Nasal Swab     Status: None   Collection Time: 05/18/22  7:59 AM   Specimen: Anterior Nasal Swab  Result Value Ref Range Status   SARS Coronavirus 2 by RT PCR NEGATIVE NEGATIVE Final    Comment: (NOTE) SARS-CoV-2 target nucleic acids are NOT DETECTED.  The SARS-CoV-2 RNA is generally detectable in upper respiratory specimens during the acute phase of infection. The lowest concentration of SARS-CoV-2 viral copies this assay can detect is 138 copies/mL. Christabelle Hanzlik negative result does not preclude SARS-Cov-2 infection and should  not be used as the sole basis for treatment or other patient management decisions. Albany Winslow negative result may occur with  improper specimen collection/handling, submission of specimen other than nasopharyngeal swab, presence of viral mutation(s) within the areas targeted by this assay, and inadequate number of viral copies(<138 copies/mL). Kyaira Trantham negative result must be combined with clinical observations, patient history, and epidemiological information. The expected result is Negative.  Fact Sheet for Patients:  EntrepreneurPulse.com.au  Fact Sheet for Healthcare Providers:  IncredibleEmployment.be  This test is no t yet approved or cleared by the [place] FDA and  has been authorized for detection and/or diagnosis of SARS-CoV-2 by FDA under an Emergency Use Authorization (EUA). This EUA will remain  in effect (meaning this test can be used) for the duration of the COVID-19 declaration under Section 564(b)(1) of the Act, 21 U.S.C.section 360bbb-3(b)(1), unless the authorization is terminated  or revoked sooner.       Influenza Xane Amsden by PCR NEGATIVE NEGATIVE Final   Influenza B by PCR NEGATIVE NEGATIVE Final    Comment: (NOTE) The Xpert Xpress SARS-CoV-2/FLU/RSV plus assay is intended as an aid in the diagnosis of influenza from Nasopharyngeal swab specimens and should not be used as Niani Mourer sole basis for treatment. Nasal washings and aspirates are unacceptable for Xpert Xpress SARS-CoV-2/FLU/RSV testing.  Fact Sheet for Patients: EntrepreneurPulse.com.au  Fact Sheet for Healthcare Providers: IncredibleEmployment.be  This test is not yet approved or cleared by the [place] FDA and has been authorized for detection and/or diagnosis of SARS-CoV-2 by FDA under an Emergency Use Authorization (EUA). This EUA will remain in effect (meaning this test can be used) for the duration of the COVID-19 declaration under  Section 564(b)(1) of the Act, 21 U.S.C. section 360bbb-3(b)(1), unless the authorization is terminated or revoked.     Resp Syncytial Virus by PCR NEGATIVE NEGATIVE Final    Comment: (NOTE) Fact Sheet for Patients: EntrepreneurPulse.com.au  Fact Sheet for Healthcare Providers: IncredibleEmployment.be  This test is not yet approved or cleared by the [place] FDA and has been authorized for detection and/or diagnosis of SARS-CoV-2 by FDA under an Emergency Use Authorization (EUA). This EUA will remain in effect (meaning this test can be used) for the duration of the COVID-19 declaration under Section 564(b)(1) of the Act, 21 U.S.C. section 360bbb-3(b)(1), unless the authorization is terminated or revoked.  Performed at [facility], [address] [place], [place]  [address]   Culture, blood (Routine X 2) w Reflex to ID Panel     Status: None (Preliminary result)   Collection Time: 05/18/22  9:32 AM   Specimen: BLOOD  Result Value Ref Range Status   Specimen Description BLOOD BLOOD RIGHT ARM  Final   Special Requests   Final    BOTTLES DRAWN AEROBIC AND ANAEROBIC Blood Culture adequate volume   Culture   Final    NO GROWTH 2 DAYS Performed at [facility], [address] [place], [place] [address]    Report Status PENDING  Incomplete  Culture, blood (Routine X 2) w Reflex to ID Panel     Status: None (Preliminary result)   Collection Time: 05/18/22  9:32 AM   Specimen: BLOOD  Result Value Ref Range Status   Specimen Description BLOOD BLOOD LEFT HAND  Final   Special Requests   Final    BOTTLES DRAWN AEROBIC AND ANAEROBIC Blood Culture results may not be optimal due to an excessive volume of blood received in culture bottles   Culture   Final    NO GROWTH 2 DAYS Performed at [facility], [address] [place], [place] [address]    Report Status PENDING  Incomplete  Respiratory (~20 pathogens) panel by PCR     Status: None    Collection Time: 05/19/22  2:38 PM   Specimen: Nasopharyngeal Swab; Respiratory  Result Value Ref Range Status   Adenovirus NOT DETECTED NOT DETECTED Final   Coronavirus 229E NOT DETECTED NOT DETECTED Final    Comment: (NOTE) The Coronavirus on the Respiratory Panel, DOES NOT test for the novel  Coronavirus (2019 nCoV)    Coronavirus HKU1 NOT DETECTED NOT DETECTED Final   Coronavirus NL63 NOT DETECTED NOT DETECTED Final   Coronavirus OC43 NOT DETECTED NOT DETECTED Final   Metapneumovirus NOT DETECTED NOT DETECTED Final   Rhinovirus / Enterovirus NOT DETECTED NOT DETECTED Final   Influenza Aniko Finnigan NOT DETECTED NOT DETECTED Final   Influenza B NOT DETECTED NOT DETECTED Final   Parainfluenza Virus 1 NOT DETECTED NOT DETECTED Final   Parainfluenza Virus 2 NOT DETECTED NOT DETECTED Final   Parainfluenza Virus 3 NOT DETECTED NOT DETECTED Final   Parainfluenza Virus 4 NOT DETECTED NOT DETECTED Final   Respiratory Syncytial Virus NOT DETECTED NOT DETECTED Final   Bordetella pertussis NOT DETECTED NOT DETECTED Final   Bordetella Parapertussis NOT DETECTED NOT DETECTED Final   Chlamydophila pneumoniae NOT DETECTED NOT DETECTED Final   Mycoplasma pneumoniae NOT DETECTED NOT DETECTED Final    Comment: Performed at [facility], [place]. [address], [place], [place] [address]  MRSA Next Gen by PCR, Nasal     Status: None   Collection Time: 05/19/22  2:38 PM   Specimen: Nasal Mucosa; Nasal Swab  Result Value Ref Range Status   MRSA by PCR Next Gen NOT DETECTED NOT DETECTED Final    Comment: (NOTE) The GeneXpert MRSA Assay (FDA approved for NASAL specimens only), is one component of Hamda Klutts comprehensive MRSA colonization surveillance program. It is not intended to diagnose MRSA infection nor to guide or monitor treatment for MRSA infections. Test performance is not FDA approved in patients less than 28 years old. Performed at [facility], [place] [address], [place], [place] [address]           Radiology Studies: ECHOCARDIOGRAM LIMITED  Result Date: 05/19/2022    ECHOCARDIOGRAM LIMITED REPORT   Patient Name:   Junie Spencer Date of Exam:  05/19/2022 Medical Rec #:  [id number]        Height:       64.0 in Accession #:    [id number]       Weight:       189.6 lb Date of Birth:  June 06, 1950       BSA:          1.912 m Patient Age:    43 years         BP:           139/73 mmHg Patient Gender: F                HR:           108 bpm. Exam Location:  Inpatient Procedure: Limited Echo, Cardiac Doppler and Limited Color Doppler Indications:    I31.3 Pericardial effusion (noninflammatory)  History:        Patient has prior history of Echocardiogram examinations, most                 recent 05/18/2022. Signs/Symptoms:Shortness of Breath; Risk                 Factors:Diabetes and Non-Smoker. Pericarditis; respiratory                 failure.  Sonographer:    Wilkie Aye RVT RCS Referring Phys: [id number] VISHNU P MALLIPEDDI  Sonographer Comments: Technically challenging study due to limited acoustic windows, Technically difficult study due to poor echo windows, suboptimal subcostal window and suboptimal apical window. IMPRESSIONS  1. Limited Echo to r/o cardiac tamponade.  2. There is Savahna Casados moderate pericardial effusion anterior to the right ventricle and lateral to the left ventricle. Right atrial collapse in late diastole is noted. Right ventricle is not well visualized to definitely rule out tamponade. IVC is dilated and has <50% collapsibility. Clinical correlation is recommended. FINDINGS  Pericardium: There is Ras Kollman moderate pericardial effusion anterior to the right ventricle and lateral to the left ventricle. Right atrial collapse in late diastole is noted. Right ventricle is not well visualized to definitely rule out tamponade. IVC is dilated and has <50% collapsibility. Clinical correlation is recommended. RIGHT VENTRICLE          IVC RV Basal diam:  3.70 cm  IVC diam: 2.10 cm Vishnu Priya Mallipeddi  Electronically signed by Lorelee Cover Mallipeddi Signature Date/Time: 05/19/2022/3:51:02 PM    Final    ECHOCARDIOGRAM COMPLETE  Result Date: 05/18/2022    ECHOCARDIOGRAM REPORT   Patient Name:   IRENE MACBETH Date of Exam: 05/18/2022 Medical Rec #:  [id number]        Height:       64.0 in Accession #:    [id number]       Weight:       200.0 lb Date of Birth:  Aug 23, 1950       BSA:          1.956 m Patient Age:    11 years         BP:           153/83 mmHg Patient Gender: F                HR:           100 bpm. Exam Location:  [facility] Procedure: 2D Echo, Color Doppler, Cardiac Doppler and Intracardiac            Opacification Agent STAT ECHO Indications:    Pericardial effusion  History:  Patient has no prior history of Echocardiogram examinations.                 Signs/Symptoms:Shortness of Breath, Fever and Chest Pain; Risk                 Factors:Hypertension.  Sonographer:    Eartha Inch Referring Phys: [phone] DAVID TAT  Sonographer Comments: Technically difficult study due to poor echo windows and patient is obese. Image acquisition challenging due to patient body habitus and Image acquisition challenging due to respiratory motion. IMPRESSIONS  1. Poor echo windows  2. Moderate pericardial effusion. The pericardial effusion is lateral to the left ventricle and anterior to the right ventricle. Moderate pleural effusion in the left lateral region. Right atrium and right ventricle not well visualized to definitely rule out cardiac tampoande. IVC not well visualized.  3. Challenging study due to tachycardia. Left ventricular ejection fraction, by estimation, is 55 to 60%. The left ventricle has normal function. The left ventricle has no regional wall motion abnormalities. Left ventricular diastolic function could not  be evaluated.  4. Right ventricular systolic function was not well visualized. The right ventricular size is not well visualized. Tricuspid regurgitation signal is inadequate for  assessing PA pressure.  5. The mitral valve is normal in structure. No evidence of mitral valve regurgitation. No evidence of mitral stenosis.  6. The aortic valve is tricuspid. Aortic valve regurgitation is not visualized. No aortic stenosis is present. Comparison(s): No prior Echocardiogram. FINDINGS  Left Ventricle: Left ventricular ejection fraction, by estimation, is 55 to 60%. The left ventricle has normal function. The left ventricle has no regional wall motion abnormalities. Definity contrast agent was given IV to delineate the left ventricular  endocardial borders. The left ventricular internal cavity size was normal in size. There is no left ventricular hypertrophy. Left ventricular diastolic function could not be evaluated. Right Ventricle: The right ventricular size is not well visualized. Right vetricular wall thickness was not well visualized. Right ventricular systolic function was not well visualized. Tricuspid regurgitation signal is inadequate for assessing PA pressure. Left Atrium: Left atrial size was normal in size. Right Atrium: Right atrial size was normal in size. Pericardium: Mahika Vanvoorhis moderately sized pericardial effusion is present. The pericardial effusion is lateral to the left ventricle and anterior to the right ventricle. Mitral Valve: The mitral valve is normal in structure. No evidence of mitral valve regurgitation. No evidence of mitral valve stenosis. Tricuspid Valve: The tricuspid valve is not well visualized. Tricuspid valve regurgitation is not demonstrated. No evidence of tricuspid stenosis. Aortic Valve: The aortic valve is tricuspid. Aortic valve regurgitation is not visualized. No aortic stenosis is present. Pulmonic Valve: The pulmonic valve was not well visualized. Pulmonic valve regurgitation is not visualized. No evidence of pulmonic stenosis. Aorta: The aortic root and ascending aorta are structurally normal, with no evidence of dilitation. Venous: The inferior vena cava was  not well visualized. IAS/Shunts: No atrial level shunt detected by color flow Doppler. Additional Comments: There is Breanne Olvera moderate pleural effusion in the left lateral region.  LEFT VENTRICLE PLAX 2D LVIDd:         3.00 cm LVIDs:         2.40 cm LV PW:         1.10 cm LV IVS:        0.90 cm LVOT diam:     2.00 cm LVOT Area:     3.14 cm  LEFT ATRIUM  Index        RIGHT ATRIUM           Index LA diam:        3.00 cm 1.53 cm/m   RA Area:     18.20 cm LA Vol (A2C):   59.7 ml 30.52 ml/m  RA Volume:   44.60 ml  22.80 ml/m LA Vol (A4C):   49.2 ml 25.15 ml/m LA Biplane Vol: 54.4 ml 27.81 ml/m   AORTA Ao Root diam: 3.20 cm Ao Asc diam:  3.00 cm  SHUNTS Systemic Diam: 2.00 cm Vishnu Priya Mallipeddi Electronically signed by Lorelee Cover Mallipeddi Signature Date/Time: 05/18/2022/4:52:38 PM    Final         Scheduled Meds:  aspirin  975 mg Oral TID   cholecalciferol  2,000 Units Oral Daily   colchicine  0.6 mg Oral BID   diltiazem  30 mg Oral Q6H   enoxaparin (LOVENOX) injection  40 mg Subcutaneous Q24H   ferrous sulfate  325 mg Oral TID PC   folic acid  1 mg Oral Daily   furosemide  20 mg Intravenous BID   insulin aspart  0-15 Units Subcutaneous TID WC   insulin aspart  0-5 Units Subcutaneous QHS   insulin glargine-yfgn  10 Units Subcutaneous QHS   pantoprazole  40 mg Oral Daily   Continuous Infusions:  sodium chloride Stopped (05/18/22 1757)   azithromycin 500 mg (05/20/22 0845)   cefTRIAXone (ROCEPHIN)  IV 1 g (05/20/22 0807)   diltiazem (CARDIZEM) infusion Stopped (05/20/22 1507)     LOS: 2 days    Time spent: over 30 min    Fayrene Helper, MD [facility]   To contact the attending provider between 7A-7P or the covering provider during after hours 7P-7A, please log into the web site [email or web address] and access using universal [place] password for that web site. If you do not have the password, please call the hospital operator.  05/20/2022, 3:45 PM

## 2022-05-21 NOTE — Progress Notes (Signed)
PCCM Progress Note   Bedside ultrasound completed to assess possible pocket for bedside thoracentesis no clear pocket seen. Given history of immunosuppression patient would benefit from pigtial chesrt tube insertion as apposed to thoracentesis.Order changed to refect IR need.     Jai Bear D. Harris, NP-C [facility] Personal contact information can be found on Amion  If no contact or response made please call [phone] 05/21/2022, 4:29 PM

## 2022-05-21 NOTE — TOC Benefit Eligibility Note (Signed)
Patient Teacher, English as a foreign language completed.    The patient is currently admitted and upon discharge could be taking colchicine 0.6 mg tablets.  The current 30 day co-pay is $0.00.   The patient is insured through [place], [place] Patient Advocate Specialist [place] Patient Advocate Team Direct Number: [phone]  Fax: [phone]

## 2022-05-21 NOTE — Progress Notes (Addendum)
Rounding Note    Patient Name: Caitlyn Newman Date of Encounter: 05/21/2022  [facility] Cardiologist: None   Subjective   Feeling well this morning. Very mid pleuritic chest pain.   Inpatient Medications    Scheduled Meds:  aspirin  975 mg Oral TID   cholecalciferol  2,000 Units Oral Daily   colchicine  0.6 mg Oral BID   diltiazem  30 mg Oral Q6H   enoxaparin (LOVENOX) injection  40 mg Subcutaneous Q24H   ferrous sulfate  325 mg Oral TID PC   folic acid  1 mg Oral Daily   furosemide  20 mg Intravenous BID   insulin aspart  0-15 Units Subcutaneous TID WC   insulin aspart  0-5 Units Subcutaneous QHS   pantoprazole  40 mg Oral Daily   potassium chloride  40 mEq Oral Once   Continuous Infusions:  azithromycin 500 mg (05/20/22 0845)   cefTRIAXone (ROCEPHIN)  IV 1 g (05/20/22 0807)   diltiazem (CARDIZEM) infusion Stopped (05/20/22 1507)   magnesium sulfate bolus IVPB     PRN Meds: acetaminophen **OR** acetaminophen, albuterol, ondansetron **OR** ondansetron (ZOFRAN) IV   Vital Signs    Vitals:   05/20/22 1811 05/20/22 2053 05/21/22 0552 05/21/22 0751  BP: 138/68 139/60 139/67 (!) 146/63  Pulse:  92 91 89  Resp:  20 16 16  $ Temp:  98.2 F (36.8 C) 98.3 F (36.8 C) 98.3 F (36.8 C)  TempSrc:  Oral Oral Oral  SpO2:  92% 94% 97%  Weight:   84.9 kg   Height:        Intake/Output Summary (Last 24 hours) at 05/21/2022 0828 Last data filed at 05/20/2022 1507 Gross per 24 hour  Intake 1491.58 ml  Output --  Net 1491.58 ml      05/21/2022    5:52 AM 05/20/2022    4:25 AM 05/19/2022    5:05 AM  Last 3 Weights  Weight (lbs) 187 lb 3.2 oz 188 lb 4.8 oz 189 lb 9.5 oz  Weight (kg) 84.913 kg 85.412 kg 86 kg      Telemetry    Sinus Rhythm - Personally Reviewed  ECG   No new tracing this morning  Physical Exam   GEN: No acute distress.   Neck: No JVD Cardiac: RRR, no murmurs, rubs, or gallops.  Respiratory: Clear to auscultation bilaterally. GI:  Soft, nontender, non-distended  MS: No edema; No deformity. Neuro:  Nonfocal  Psych: Normal affect   Labs    High Sensitivity Troponin:  No results for input(s): "TROPONINIHS" in the last 720 hours.   Chemistry Recent Labs  Lab 05/19/22 0140 05/20/22 0207 05/21/22 0229  NA 135 138 138  K 4.2 3.3* 3.8  CL 104 102 105  CO2 24 26 24  $ GLUCOSE 128* 93 103*  BUN 12 14 13  $ CREATININE 0.84 0.86 0.83  CALCIUM 7.8* 7.9* 7.9*  MG 1.9 1.8 1.7  PROT  --  5.5* 5.4*  ALBUMIN  --  2.2* 2.2*  AST  --  24 20  ALT  --  29 26  ALKPHOS  --  70 71  BILITOT  --  0.8 0.3  GFRNONAA >60 >60 >60  ANIONGAP 7 10 9    $ Lipids No results for input(s): "CHOL", "TRIG", "HDL", "LABVLDL", "LDLCALC", "CHOLHDL" in the last 168 hours.  Hematology Recent Labs  Lab 05/19/22 0140 05/20/22 0207 05/21/22 0229  WBC 17.9* 14.0* 12.9*  RBC 3.50* 3.47* 3.41*  HGB 11.2* 11.1* 10.9*  HCT 33.2* 32.6* 31.7*  MCV 94.9 93.9 93.0  MCH 32.0 32.0 32.0  MCHC 33.7 34.0 34.4  RDW 14.2 14.2 14.1  PLT 232 242 243   Thyroid No results for input(s): "TSH", "FREET4" in the last 168 hours.  BNP Recent Labs  Lab 05/18/22 1601  BNP 196.0*    DDimer No results for input(s): "DDIMER" in the last 168 hours.   Radiology    ECHOCARDIOGRAM LIMITED  Result Date: 05/19/2022    ECHOCARDIOGRAM LIMITED REPORT   Patient Name:   Caitlyn Newman Date of Exam: 05/19/2022 Medical Rec #:  [id number]        Height:       64.0 in Accession #:    [id number]       Weight:       189.6 lb Date of Birth:  1950/06/19       BSA:          1.912 m Patient Age:    72 years         BP:           139/73 mmHg Patient Gender: F                HR:           108 bpm. Exam Location:  Inpatient Procedure: Limited Echo, Cardiac Doppler and Limited Color Doppler Indications:    I31.3 Pericardial effusion (noninflammatory)  History:        Patient has prior history of Echocardiogram examinations, most                 recent 05/18/2022. Signs/Symptoms:Shortness of  Breath; Risk                 Factors:Diabetes and Non-Smoker. Pericarditis; respiratory                 failure.  Sonographer:    Wilkie Aye RVT RCS Referring Phys: [id number] VISHNU P MALLIPEDDI  Sonographer Comments: Technically challenging study due to limited acoustic windows, Technically difficult study due to poor echo windows, suboptimal subcostal window and suboptimal apical window. IMPRESSIONS  1. Limited Echo to r/o cardiac tamponade.  2. There is a moderate pericardial effusion anterior to the right ventricle and lateral to the left ventricle. Right atrial collapse in late diastole is noted. Right ventricle is not well visualized to definitely rule out tamponade. IVC is dilated and has <50% collapsibility. Clinical correlation is recommended. FINDINGS  Pericardium: There is a moderate pericardial effusion anterior to the right ventricle and lateral to the left ventricle. Right atrial collapse in late diastole is noted. Right ventricle is not well visualized to definitely rule out tamponade. IVC is dilated and has <50% collapsibility. Clinical correlation is recommended. RIGHT VENTRICLE          IVC RV Basal diam:  3.70 cm  IVC diam: 2.10 cm Vishnu Priya Mallipeddi Electronically signed by Lorelee Cover Mallipeddi Signature Date/Time: 05/19/2022/3:51:02 PM    Final     Cardiac Studies   Echo: 05/18/2022  IMPRESSIONS    1. Poor echo windows   2. Moderate pericardial effusion. The pericardial effusion is lateral to  the left ventricle and anterior to the right ventricle. Moderate pleural  effusion in the left lateral region. Right atrium and right ventricle not  well visualized to definitely  rule out cardiac tampoande. IVC not well visualized.   3. Challenging study due to tachycardia. Left ventricular ejection  fraction, by estimation, is 55 to 60%. The  left ventricle has normal  function. The left ventricle has no regional wall motion abnormalities.  Left ventricular diastolic function could  not   be evaluated.   4. Right ventricular systolic function was not well visualized. The right  ventricular size is not well visualized. Tricuspid regurgitation signal is  inadequate for assessing PA pressure.   5. The mitral valve is normal in structure. No evidence of mitral valve  regurgitation. No evidence of mitral stenosis.   6. The aortic valve is tricuspid. Aortic valve regurgitation is not  visualized. No aortic stenosis is present.   Comparison(s): No prior Echocardiogram.   FINDINGS   Left Ventricle: Left ventricular ejection fraction, by estimation, is 55  to 60%. The left ventricle has normal function. The left ventricle has no  regional wall motion abnormalities. Definity contrast agent was given IV  to delineate the left ventricular   endocardial borders. The left ventricular internal cavity size was normal  in size. There is no left ventricular hypertrophy. Left ventricular  diastolic function could not be evaluated.   Right Ventricle: The right ventricular size is not well visualized. Right  vetricular wall thickness was not well visualized. Right ventricular  systolic function was not well visualized. Tricuspid regurgitation signal  is inadequate for assessing PA  pressure.   Left Atrium: Left atrial size was normal in size.   Right Atrium: Right atrial size was normal in size.   Pericardium: A moderately sized pericardial effusion is present. The  pericardial effusion is lateral to the left ventricle and anterior to the  right ventricle.   Mitral Valve: The mitral valve is normal in structure. No evidence of  mitral valve regurgitation. No evidence of mitral valve stenosis.   Tricuspid Valve: The tricuspid valve is not well visualized. Tricuspid  valve regurgitation is not demonstrated. No evidence of tricuspid  stenosis.   Aortic Valve: The aortic valve is tricuspid. Aortic valve regurgitation is  not visualized. No aortic stenosis is present.    Pulmonic Valve: The pulmonic valve was not well visualized. Pulmonic valve  regurgitation is not visualized. No evidence of pulmonic stenosis.   Aorta: The aortic root and ascending aorta are structurally normal, with  no evidence of dilitation.   Venous: The inferior vena cava was not well visualized.   IAS/Shunts: No atrial level shunt detected by color flow Doppler.   Additional Comments: There is a moderate pleural effusion in the left  lateral region.   Limited Echo: 05/19/2022  IMPRESSIONS     1. Limited Echo to r/o cardiac tamponade.   2. There is a moderate pericardial effusion anterior to the right  ventricle and lateral to the left ventricle. Right atrial collapse in late  diastole is noted. Right ventricle is not well visualized to definitely  rule out tamponade. IVC is dilated and  has <50% collapsibility. Clinical correlation is recommended.   FINDINGS   Pericardium: There is a moderate pericardial effusion anterior to the  right ventricle and lateral to the left ventricle. Right atrial collapse  in late diastole is noted. Right ventricle is not well visualized to  definitely rule out tamponade. IVC is  dilated and has <50% collapsibility. Clinical correlation is recommended.   RIGHT VENTRICLE          IVC  RV Basal diam:  3.70 cm  IVC diam: 2.10 cm    Patient Profile     72 y.o. female with PMH of rheumatoid arthritis on methotrexate presented to ER with 1  week history of SOB, chest discomfort and fever, chills. Admitted to the hospitalist team for the management of acute pericarditis and pneumonia.   Assessment & Plan    Acute pericarditis Moderate pericardial effusion -- Presented with 1 week of shortness of breath, fever and chills with pleuritic chest pain.  EKG showed slight ST elevation in inferior lateral leads, with PR depression in V5 V6.  Echo 2/9 with moderate pericardial effusion, EF 55 to 60%, RV not well-visualized.  Repeat limited echo with  no signs of cardiac tamponade. -- CRP 19.7, Sed rate 40 -- Initially placed on aspirin 1 g every 8 hours x 2 weeks with plans to taper to 500 mg  every week.  She is also on colchicine 0.6 mg twice daily with recommendations for 3 months.  Will drop aspirin dose back to 650 mg TID x1 week, 349m TID x1 week -- continue colchicine 0.628mBID for 3 months  -- continue PPI  Atrial flutter RVR -- developed brief episode of narrow complex tachycardia the morning of 2/11 with return to NSR after about an hour. Initially treated with IV Diltiazem, now transitioned to oral dosing -- no OAC right now with plans for thoracentesis -- if no further episodes, with her brief duration in the setting of acute illness, may opt for outpatient cardiac monitor to assess for recurrence   Pleural effusions -- CT chest with moderated left loculated pleural effusion, small right sided pleural effusion -- planned for thoracentesis   CAP -- CXR with moderate to extensive lingular and LLL atelectasis, moderate R basilar atelectasis -- antibiotics per primary  HFpEF -- receiving IV lasix, no output recorded. Breathing has improved -- weights trending down by 3lbs -- will stop IV lasix for now   Per primary Rheumatoid Arthritis Pre diabetes   For questions or updates, please contact [place] consult [email or web address] for contact info under        Signed, LiReino BellisNP  05/21/2022, 8:28 AM    Patient seen, examined. Available data reviewed. Agree with findings, assessment, and plan as outlined by LiReino BellisNP.  On my exam, she is alert, oriented, no distress.  HEENT is normal, JVP is normal, lungs are clear bilaterally, heart is regular rate and rhythm with no murmur, gallop, or rub.  Abdomen is soft and nontender, extremities have no significant edema.  I reviewed the patient's clinical presentation, lab and imaging data, and treatment plan.  I agree with anti-inflammatory  treatment of acute pericarditis as outlined above with a tapering dose of aspirin as well as the use of colchicine.  Her telemetry last 24 hours has not shown any arrhythmia.  Even if she has brief atrial dysrhythmias, she is not a good candidate for oral anticoagulation and in fact I would avoid this in the setting of pericarditis with pericardial effusion and she would be at risk of hemorrhage into the pericardial space.  MiSherren MochaM.D. 05/21/2022 10:21 AM

## 2022-05-21 NOTE — Plan of Care (Signed)

## 2022-05-21 NOTE — Progress Notes (Signed)
   NAME:  Caitlyn Newman, MRN:  [id number], DOB:  1950/07/01, LOS: 3 ADMISSION DATE:  05/18/2022, CONSULTATION DATE:  05/19/2022 REFERRING MD:  Dr. Florene Glen, CHIEF COMPLAINT: Left Pleural Effusion    History of Present Illness:  72 year old female with PMH rheumatoid arthritis, HTN, DM. Presents to ED on 2/9 with reported one week of fever, chills, shortness of breath, cough with yellow sputum production, chest discomfort. Patient states she has not seen PCP in over 5 years.   On arrival to ED temp 99.1, HR 120s, oxygen saturation 89% on room air. WBC 22.6. Lactic acid 2.7. CT chest with moderate to large pericardial effusion with mild pericardial enhancement concerning for exudative process, moderate left and small right pleural effusions. Cardiology consulted. Recommended treatment for acute pericarditis with aspirin, PPI, colchicine. 2/10 Pulmonary consulted for recommendations for left pleural effusion   Pertinent  Medical History  RA on methotrexate, HTN, DM    Significant Hospital Events: Including procedures, antibiotic start and stop dates in addition to other pertinent events   2/9 Presents to ED  2/12 No acute issues overnight, remains on RA. IR consulted for thoracentesis  Interim History / Subjective:  States she feels well with no acute complaints  Family at bedside and updated.   Objective   Blood pressure (!) 146/63, pulse 89, temperature 98.3 F (36.8 C), temperature source Oral, resp. rate 16, height 5\' 4"  (1.626 m), weight 84.9 kg, SpO2 97 %.        Intake/Output Summary (Last 24 hours) at 05/21/2022 1158 Last data filed at 05/20/2022 1507 Gross per 24 hour  Intake 891.58 ml  Output --  Net 891.58 ml    Filed Weights   05/19/22 0505 05/20/22 0425 05/21/22 0552  Weight: 86 kg 85.4 kg 84.9 kg    Examination: General: Chronically ill appearing middle aged female sitting up in bedside recliner, in NAD HEENT: [place]/AT, MM pink/moist, PERRL,  Neuro: Alert and oriented  x3 CV: s1s2 regular rate and rhythm, no murmur, rubs, or gallops,  PULM: Clear to auscultation, no increased work of breahting, no added breath sounds GI: soft, bowel sounds active in all 4 quadrants, non-tender, non-distended, tolerating oral diet Extremities: warm/dry, no edema  Skin: no rashes or lesions  Resolved Hospital Problem list     Assessment & Plan:   Loculated left pleural effusion, suspect infectious in nature  P: Pending thoracentesis  Obtain cytology, culture, LDL, protein, and cell count  Continue azithromycin and Ceftriaxone  Will assess with bedside ultrasound to see if bedside thoracentesis vs chest tube can be placed Follow labwork up to include Strep P, Legionella, RVP, Fungitell/Aspergillus (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 Harris, NP-C [facility] Personal contact information can be found on Amion  If no contact or response made please call [phone] 05/21/2022, 11:59 AM

## 2022-05-21 NOTE — Progress Notes (Signed)
Mobility Specialist Progress Note:   05/21/22 0915  Mobility  Activity Ambulated independently in hallway  Level of Assistance Modified independent, requires aide device or extra time  Assistive Device None  Distance Ambulated (ft) 200 ft  Activity Response Tolerated well  Mobility Referral Yes  $Mobility charge 1 Mobility   Pt agreeable to mobility session. Required no physical assistance throughout. HR 90-110bpm throughout. Pt left in chair with all needs met.   Nelta Numbers Mobility Specialist Please contact via SecureChat or  Rehab office at [phone]

## 2022-05-22 ENCOUNTER — Other Ambulatory Visit: Payer: Self-pay | Admitting: Cardiology

## 2022-05-22 ENCOUNTER — Inpatient Hospital Stay (HOSPITAL_COMMUNITY): Payer: Medicare HMO

## 2022-05-22 DIAGNOSIS — I319 Disease of pericardium, unspecified: Secondary | ICD-10-CM | POA: Diagnosis not present

## 2022-05-22 DIAGNOSIS — I301 Infective pericarditis: Secondary | ICD-10-CM | POA: Diagnosis not present

## 2022-05-22 DIAGNOSIS — I3139 Other pericardial effusion (noninflammatory): Secondary | ICD-10-CM

## 2022-05-22 HISTORY — PX: IR THORACENTESIS ASP PLEURAL SPACE W/IMG GUIDE: IMG5380

## 2022-05-22 LAB — BASIC METABOLIC PANEL
Anion gap: 12 (ref 5–15)
BUN: 10 mg/dL (ref 8–23)
CO2: 20 mmol/L — ABNORMAL LOW (ref 22–32)
Calcium: 8.2 mg/dL — ABNORMAL LOW (ref 8.9–10.3)
Chloride: 107 mmol/L (ref 98–111)
Creatinine, Ser: 0.82 mg/dL (ref 0.44–1.00)
GFR, Estimated: >60 mL/min (ref >60)
Glucose, Bld: 88 mg/dL (ref 70–99)
Potassium: 4.1 mmol/L (ref 3.5–5.1)
Sodium: 139 mmol/L (ref 135–145)

## 2022-05-22 LAB — PROTIME-INR
INR: 1.2 (ref 0.8–1.2)
Prothrombin Time: 14.7 seconds (ref 11.4–15.2)

## 2022-05-22 LAB — GRAM STAIN

## 2022-05-22 LAB — BODY FLUID CELL COUNT WITH DIFFERENTIAL
Eos, Fluid: 0 %
Lymphs, Fluid: 8 %
Monocyte-Macrophage-Serous Fluid: 7 % — ABNORMAL LOW (ref 50–90)
Neutrophil Count, Fluid: 85 % — ABNORMAL HIGH (ref 0–25)
Total Nucleated Cell Count, Fluid: 399 cu mm (ref 0–1000)

## 2022-05-22 LAB — LACTATE DEHYDROGENASE, PLEURAL OR PERITONEAL FLUID: LD, Fluid: 138 U/L — ABNORMAL HIGH (ref 3–23)

## 2022-05-22 LAB — MAGNESIUM: Magnesium: 2 mg/dL (ref 1.7–2.4)

## 2022-05-22 LAB — LEGIONELLA PNEUMOPHILA SEROGP 1 UR AG: L. pneumophila Serogp 1 Ur Ag: NEGATIVE

## 2022-05-22 LAB — PROTEIN, PLEURAL OR PERITONEAL FLUID: Total protein, fluid: <3 g/dL

## 2022-05-22 MED ORDER — LIDOCAINE HCL 1 % IJ SOLN
INTRAMUSCULAR | Status: AC
  Administered 2022-05-22: 10 mL
  Filled 2022-05-22: qty 20

## 2022-05-22 MED ORDER — ENOXAPARIN SODIUM 40 MG/0.4ML IJ SOSY
40.0000 mg | PREFILLED_SYRINGE | INTRAMUSCULAR | Status: DC
  Administered 2022-05-23: 40 mg via SUBCUTANEOUS
  Filled 2022-05-22: qty 0.4

## 2022-05-22 NOTE — Progress Notes (Signed)
PROGRESS NOTE    Caitlyn Newman  [id number] DOB: 1950/06/17 DOA: 05/18/2022 PCP: Chevis Pretty, FNP  Chief Complaint  Patient presents with   Shortness of Breath    Brief Narrative:   Caitlyn Newman is Caitlyn Newman 72 year old female with Gertrude Tarbet history of rheumatoid arthritis and hypertension presenting with 1 week history of subjective fevers, chills, shortness of breath, cough, and chest discomfort.   She's been admitted with Keely Drennan pericardial effusion and concern for pneumonia with pleural effusions.  Cards following, being treated for pericarditis.  PCCM following, now s/p thoracentesis 2/13.  See below for additional details   Assessment & Plan:   Principal Problem:   Pericarditis Active Problems:   Class 1 obesity   Acute respiratory failure with hypoxia (HCC)   SIRS (systemic inflammatory response syndrome) (HCC)   Uncontrolled type 2 diabetes mellitus with hyperglycemia, without long-term current use of insulin (HCC)   Pleural effusion  Acute respiratory failure with hypoxia - resolved, due to below   Pericardial effusion  Pericarditis  -05/18/2022 CT chest--moderate to large pericardial effusion with mild pericardial enhancement - echo 2/9 unable to definitely rule out cardiac tamponade - repeat echo 2/10 with moderate pericardial effusion - RV not well visualized to definitively r/o tamponade - follow repeat limited echo for effusion per cards (looks like plan is for outpatient) - cardiology recommending aspirin 650 mg q8 x1 weeks followed by 325 mg TID x1 week.  PPI daily.  Colchicine 0.6 mg BID x3 months.   Sepsis due to Community Acquired Pneumonia  Moderate Left Pleural Effusion and Small Right Effusion - tachycardia, leukocytosis, hypoxia at presentation with evidence of pneumonia on imaging - CXR with moderate L effusion which may be loculated, moderate to extensive lingular and LL lobe atelectasis, moderate R basilar atelectasis.  Superimposed  infection/pneumonia are not excluded. - continue CAP coverage with ceftriaxone/azithromycin  - Follow blood cultures - sputum cx if able to produce - serum fungitell, MRSA PCR negative.  Urine strep negative, urine legionella negative.  Aspergillus Ag.  RVP negative.   - negative covid, influenza, RSV - negative HIV - procalcitonin is 0.62, follow - PCCM c/s for pleural effusion, appreciate recs - plan for IR thora, done 2/13.  Follow pending thora labs.  Appreciate assistance.   Atrial Flutter - PO dilt - hold anticoagulation given risk of hemorrhage with pericardial effusion - may need outpatient cardiac monitor?  Defer to cards  Diastolic HF - lasix per cards - currently on hold   Prediabetes -Check A1C 5.9 -NovoLog sliding scale -follow    Essential hypertension BP ok today, monitor to consider BP meds   Class I obesity Body mass index is 32.13 kg/m.   Rheumatoid arthritis -Patient takes methotrexate 10 mg on Wednesdays and Thursdays -Continue folic acid -Holding further methotrexate pending workup for her pericardial effusion and pleural effusion   Diarrhea - seems to have improved   Left Adrenal Nodule/Right Liver lesion -incidental findings on CT (indeterminate 1.7 cm hyperdense/enhancing lesion within the posterior right liver and indeterminate 2 cm L adrenal nodule) -needs an elective MRI of abdomen with and without contrast, can pursue this outpatient    DVT prophylaxis: lovenox Code Status: full Family Communication: sister at bedside Disposition:   Status is: Inpatient Remains inpatient appropriate because: continued inpatient care   Consultants:  Cardiology PCCM  Procedures:  Echo IMPRESSIONS     1. Poor echo windows   2. Moderate pericardial effusion. The pericardial effusion is lateral to  the left ventricle and  anterior to the right ventricle. Moderate pleural  effusion in the left lateral region. Right atrium and right ventricle not   well visualized to definitely  rule out cardiac tampoande. IVC not well visualized.   3. Challenging study due to tachycardia. Left ventricular ejection  fraction, by estimation, is 55 to 60%. The left ventricle has normal  function. The left ventricle has no regional wall motion abnormalities.  Left ventricular diastolic function could not   be evaluated.   4. Right ventricular systolic function was not well visualized. The right  ventricular size is not well visualized. Tricuspid regurgitation signal is  inadequate for assessing PA pressure.   5. The mitral valve is normal in structure. No evidence of mitral valve  regurgitation. No evidence of mitral stenosis.   6. The aortic valve is tricuspid. Aortic valve regurgitation is not  visualized. No aortic stenosis is present.   Comparison(s): No prior Echocardiogram.   Successful ultrasound guided therapeutic and diagnostic left-sided thoracentesis yielding 120 mL of pleural fluid.  Antimicrobials:  Anti-infectives (From admission, onward)    Start     Dose/Rate Route Frequency Ordered Stop   05/19/22 1000  azithromycin (ZITHROMAX) 500 mg in sodium chloride 0.9 % 250 mL IVPB  Status:  Discontinued        500 mg 250 mL/hr over 60 Minutes Intravenous Every 24 hours 05/18/22 2336 05/22/22 1037   05/19/22 1000  cefTRIAXone (ROCEPHIN) 1 g in sodium chloride 0.9 % 100 mL IVPB        1 g 200 mL/hr over 30 Minutes Intravenous Every 24 hours 05/18/22 2336 05/24/22 2359   05/18/22 1145  cefTRIAXone (ROCEPHIN) 1 g in sodium chloride 0.9 % 100 mL IVPB        1 g 200 mL/hr over 30 Minutes Intravenous  Once 05/18/22 1141 05/18/22 1249   05/18/22 1145  azithromycin (ZITHROMAX) 500 mg in sodium chloride 0.9 % 250 mL IVPB        500 mg 250 mL/hr over 60 Minutes Intravenous  Once 05/18/22 1141 05/18/22 1711       Subjective: No complaints  Objective: Vitals:   05/22/22 0528 05/22/22 0835 05/22/22 1250 05/22/22 1714  BP: 130/60 (!) 120/59  135/73 (!) 168/73  Pulse: 78 81 78   Resp: 16 16 17   $ Temp: 98 F (36.7 C) 97.8 F (36.6 C) 98 F (36.7 C)   TempSrc: Oral Axillary Oral   SpO2: 98% 97% 99%   Weight:      Height:       No intake or output data in the 24 hours ending 05/22/22 1843  Filed Weights   05/19/22 0505 05/20/22 0425 05/21/22 0552  Weight: 86 kg 85.4 kg 84.9 kg    Examination:  General: No acute distress. Cardiovascular: RRR Lungs: unlabored Neurological: Alert and oriented 3. Moves all extremities 4 with equal strength. Cranial nerves II through XII grossly intact. Extremities: No clubbing or cyanosis. No edema.  Data Reviewed: I have personally reviewed following labs and imaging studies  CBC: Recent Labs  Lab 05/18/22 0811 05/19/22 0140 05/20/22 0207 05/21/22 0229  WBC 22.6* 17.9* 14.0* 12.9*  NEUTROABS 21.2*  --  11.5* 10.5*  HGB 12.4 11.2* 11.1* 10.9*  HCT 38.2 33.2* 32.6* 31.7*  MCV 96.2 94.9 93.9 93.0  PLT 294 232 242 0000000    Basic Metabolic Panel: Recent Labs  Lab 05/18/22 0811 05/19/22 0140 05/20/22 0207 05/21/22 0229 05/22/22 0647  NA 134* 135 138 138 139  K  4.3 4.2 3.3* 3.8 4.1  CL 99 104 102 105 107  CO2 24 24 26 24 $ 20*  GLUCOSE 355* 128* 93 103* 88  BUN 16 12 14 13 10  $ CREATININE 1.07* 0.84 0.86 0.83 0.82  CALCIUM 8.4* 7.8* 7.9* 7.9* 8.2*  MG  --  1.9 1.8 1.7 2.0  PHOS  --   --  3.4 3.9  --     GFR: Estimated Creatinine Clearance: 68.3 mL/min (by C-G formula based on SCr of 0.82 mg/dL).  Liver Function Tests: Recent Labs  Lab 05/20/22 0207 05/21/22 0229  AST 24 20  ALT 29 26  ALKPHOS 70 71  BILITOT 0.8 0.3  PROT 5.5* 5.4*  ALBUMIN 2.2* 2.2*    CBG: Recent Labs  Lab 05/20/22 0737 05/20/22 1135 05/20/22 1622 05/20/22 2057 05/21/22 0745  GLUCAP 72 121* 94 106* 93     Recent Results (from the past 240 hour(s))  Resp panel by RT-PCR (RSV, Flu Denecia Brunette&B, Covid) Anterior Nasal Swab     Status: None   Collection Time: 05/18/22  7:59 AM   Specimen:  Anterior Nasal Swab  Result Value Ref Range Status   SARS Coronavirus 2 by RT PCR NEGATIVE NEGATIVE Final    Comment: (NOTE) SARS-CoV-2 target nucleic acids are NOT DETECTED.  The SARS-CoV-2 RNA is generally detectable in upper respiratory specimens during the acute phase of infection. The lowest concentration of SARS-CoV-2 viral copies this assay can detect is 138 copies/mL. Aarya Quebedeaux negative result does not preclude SARS-Cov-2 infection and should not be used as the sole basis for treatment or other patient management decisions. Renny Gunnarson negative result may occur with  improper specimen collection/handling, submission of specimen other than nasopharyngeal swab, presence of viral mutation(s) within the areas targeted by this assay, and inadequate number of viral copies(<138 copies/mL). Garris Melhorn negative result must be combined with clinical observations, patient history, and epidemiological information. The expected result is Negative.  Fact Sheet for Patients:  EntrepreneurPulse.com.au  Fact Sheet for Healthcare Providers:  IncredibleEmployment.be  This test is no t yet approved or cleared by the [place] FDA and  has been authorized for detection and/or diagnosis of SARS-CoV-2 by FDA under an Emergency Use Authorization (EUA). This EUA will remain  in effect (meaning this test can be used) for the duration of the COVID-19 declaration under Section 564(b)(1) of the Act, 21 U.S.C.section 360bbb-3(b)(1), unless the authorization is terminated  or revoked sooner.       Influenza Mava Suares by PCR NEGATIVE NEGATIVE Final   Influenza B by PCR NEGATIVE NEGATIVE Final    Comment: (NOTE) The Xpert Xpress SARS-CoV-2/FLU/RSV plus assay is intended as an aid in the diagnosis of influenza from Nasopharyngeal swab specimens and should not be used as Smayan Hackbart sole basis for treatment. Nasal washings and aspirates are unacceptable for Xpert Xpress SARS-CoV-2/FLU/RSV testing.  Fact  Sheet for Patients: EntrepreneurPulse.com.au  Fact Sheet for Healthcare Providers: IncredibleEmployment.be  This test is not yet approved or cleared by the [place] FDA and has been authorized for detection and/or diagnosis of SARS-CoV-2 by FDA under an Emergency Use Authorization (EUA). This EUA will remain in effect (meaning this test can be used) for the duration of the COVID-19 declaration under Section 564(b)(1) of the Act, 21 U.S.C. section 360bbb-3(b)(1), unless the authorization is terminated or revoked.     Resp Syncytial Virus by PCR NEGATIVE NEGATIVE Final    Comment: (NOTE) Fact Sheet for Patients: EntrepreneurPulse.com.au  Fact Sheet for Healthcare Providers: IncredibleEmployment.be  This test  is not yet approved or cleared by the [place] and has been authorized for detection and/or diagnosis of SARS-CoV-2 by FDA under an Emergency Use Authorization (EUA). This EUA will remain in effect (meaning this test can be used) for the duration of the COVID-19 declaration under Section 564(b)(1) of the Act, 21 U.S.C. section 360bbb-3(b)(1), unless the authorization is terminated or revoked.  Performed at [facility], [address] [place], [place] [address]   Culture, blood (Routine X 2) w Reflex to ID Panel     Status: None (Preliminary result)   Collection Time: 05/18/22  9:32 AM   Specimen: BLOOD  Result Value Ref Range Status   Specimen Description   Final    BLOOD BLOOD RIGHT ARM Performed at [facility], [address] [place], [place] [address]    Special Requests   Final    BOTTLES DRAWN AEROBIC AND ANAEROBIC Blood Culture adequate volume Performed at [facility], [address] [place], [place] [address]    Culture   Final    NO GROWTH 4 DAYS Performed at [facility], [place] [address], [place], [place] [address]    Report Status PENDING  Incomplete  Culture,  blood (Routine X 2) w Reflex to ID Panel     Status: None (Preliminary result)   Collection Time: 05/18/22  9:32 AM   Specimen: BLOOD  Result Value Ref Range Status   Specimen Description   Final    BLOOD BLOOD LEFT HAND Performed at [facility], [address] [place], [place] [address]    Special Requests   Final    BOTTLES DRAWN AEROBIC AND ANAEROBIC Blood Culture results may not be optimal due to an excessive volume of blood received in culture bottles Performed at [facility] [address], [place], [place] [address]    Culture   Final    NO GROWTH 4 DAYS Performed at [facility], [place] [address], [place], [place] [address]    Report Status PENDING  Incomplete  Respiratory (~20 pathogens) panel by PCR     Status: None   Collection Time: 05/19/22  2:38 PM   Specimen: Nasopharyngeal Swab; Respiratory  Result Value Ref Range Status   Adenovirus NOT DETECTED NOT DETECTED Final   Coronavirus 229E NOT DETECTED NOT DETECTED Final    Comment: (NOTE) The Coronavirus on the Respiratory Panel, DOES NOT test for the novel  Coronavirus (2019 nCoV)    Coronavirus HKU1 NOT DETECTED NOT DETECTED Final   Coronavirus NL63 NOT DETECTED NOT DETECTED Final   Coronavirus OC43 NOT DETECTED NOT DETECTED Final   Metapneumovirus NOT DETECTED NOT DETECTED Final   Rhinovirus / Enterovirus NOT DETECTED NOT DETECTED Final   Influenza Evaan Tidwell NOT DETECTED NOT DETECTED Final   Influenza B NOT DETECTED NOT DETECTED Final   Parainfluenza Virus 1 NOT DETECTED NOT DETECTED Final   Parainfluenza Virus 2 NOT DETECTED NOT DETECTED Final   Parainfluenza Virus 3 NOT DETECTED NOT DETECTED Final   Parainfluenza Virus 4 NOT DETECTED NOT DETECTED Final   Respiratory Syncytial Virus NOT DETECTED NOT DETECTED Final   Bordetella pertussis NOT DETECTED NOT DETECTED Final   Bordetella Parapertussis NOT DETECTED NOT DETECTED Final   Chlamydophila pneumoniae NOT DETECTED NOT DETECTED Final   Mycoplasma pneumoniae NOT  DETECTED NOT DETECTED Final    Comment: Performed at [facility], [place]. [address] [place],  [address]  MRSA Next Gen by PCR, Nasal     Status:  None   Collection Time: 05/19/22  2:38 PM   Specimen: Nasal Mucosa; Nasal Swab  Result Value Ref Range Status   MRSA by PCR Next Gen NOT DETECTED NOT DETECTED Final    Comment: (NOTE) The GeneXpert MRSA Assay (FDA approved for NASAL specimens only), is one component of [place] [facility] comprehensive MRSA colonization surveillance program. It is not intended to diagnose MRSA infection nor to guide or monitor treatment for MRSA infections. Test performance is not FDA approved in patients less than 55 years old. Performed at [facility], [place] [address] [place], [place] [address]          Radiology Studies: DG Chest 1 View  Result Date: 05/22/2022 CLINICAL DATA:  Status post left thoracentesis. EXAM: CHEST  1 VIEW COMPARISON:  May 21, 2022. FINDINGS: No pneumothorax is noted status post left thoracentesis. Grossly stable left pleural effusion is noted. IMPRESSION: No pneumothorax status post left thoracentesis. Electronically Signed   By: Marijo Conception M.D.   On: 05/22/2022 15:42   IR THORACENTESIS ASP PLEURAL SPACE W/IMG GUIDE  Result Date: 05/22/2022 INDICATION: Patient with history of arthritis and hypertension. Presented to ED with shortness of breath. Found to have pericardial effusion and pleural effusion. EXAM: ULTRASOUND GUIDED THERAPEUTIC AND DIAGNOSTIC LEFT-SIDED THORACENTESIS MEDICATIONS: Lidocaine 1% 10 mL COMPLICATIONS: None immediate. PROCEDURE: An ultrasound guided thoracentesis was thoroughly discussed with the patient and questions answered. The benefits, risks, alternatives and complications were also discussed. The patient understands and wishes to proceed with the procedure. Written consent was obtained. Ultrasound was performed to localize and mark an adequate pocket of fluid in the left chest. The area was then  prepped and draped in the normal sterile fashion. 1% Lidocaine was used for local anesthesia. Under ultrasound guidance Rayann Jolley 6 Fr Safe-T-Centesis catheter was introduced. Thoracentesis was performed. The catheter was removed and Aryahna Spagna dressing applied. FINDINGS: Trestin Vences total of approximately 120 mL of straw-colored fluid was removed. Samples were sent to the laboratory as requested by the clinical team. IMPRESSION: Successful ultrasound guided therapeutic and diagnostic left-sided thoracentesis yielding 120 mL of pleural fluid. Read by: Rushie Nyhan, NP Electronically Signed   By: Miachel Roux M.D.   On: 05/22/2022 15:36   DG CHEST PORT 1 VIEW  Result Date: 05/21/2022 CLINICAL DATA:  Pleural effusion. EXAM: PORTABLE CHEST 1 VIEW COMPARISON:  May 18, 2022. FINDINGS: Stable cardiomegaly. Stable left pleural effusion with associated left basilar atelectasis or infiltrate. Minimal right basilar subsegmental atelectasis or infiltrate is noted. Bony thorax is unremarkable. IMPRESSION: Stable left basilar opacity as described above. Minimal right basilar opacity is noted as described above. Electronically Signed   By: Marijo Conception M.D.   On: 05/21/2022 13:33        Scheduled Meds:  aspirin  650 mg Oral TID   Followed by   Derrill Memo ON 05/28/2022] aspirin  325 mg Oral TID   cholecalciferol  2,000 Units Oral Daily   colchicine  0.6 mg Oral BID   diltiazem  30 mg Oral Q6H   [START ON 05/23/2022] enoxaparin (LOVENOX) injection  40 mg Subcutaneous Q24H   ferrous sulfate  325 mg Oral TID PC   folic acid  1 mg Oral Daily   pantoprazole  40 mg Oral Daily   Continuous Infusions:  cefTRIAXone (ROCEPHIN)  IV Stopped (05/22/22 0910)   diltiazem (CARDIZEM) infusion Stopped (05/20/22 1507)     LOS: 4 days    Time spent: over 30 min    Fayrene Helper,  MD [facility]   To contact the attending provider between 7A-7P or the covering provider during after hours 7P-7A, please log into the web site  [email or web address] and access using universal [place] password for that web site. If you do not have the password, please call the hospital operator.  05/22/2022, 6:43 PM

## 2022-05-22 NOTE — Care Management Important Message (Signed)
Important Message  Patient Details  Name: Caitlyn Newman MRN: [id number] Date of Birth: 02/10/1951   Medicare Important Message Given:  Yes     [name] 05/22/2022, 11:10 AM

## 2022-05-22 NOTE — Plan of Care (Signed)

## 2022-05-22 NOTE — Progress Notes (Signed)
Mobility Specialist Progress Note:   05/22/22 1025  Mobility  Activity Ambulated with assistance in hallway;Ambulated independently in hallway  Level of Assistance Modified independent, requires aide device or extra time  Assistive Device Other (Comment) (IV Pole)  Distance Ambulated (ft) 500 ft  Activity Response Tolerated well  Mobility Referral Yes  $Mobility charge 1 Mobility   Pt agreeable to mobility session. Required no physical assistance. HR 80s-90s throughout ambulation. Pt back in bed with all needs met.   Nelta Numbers Mobility Specialist Please contact via SecureChat or  Rehab office at [phone]

## 2022-05-22 NOTE — Procedures (Signed)
Ultrasound-guided diagnostic and therapeutic left thoracentesis performed yielding 120 milliliters of straw colored fluid. No immediate complications.   Diagnostic fluid was sent to the lab for further analysis. Follow-up chest x-ray pending. EBL is < 2 ml.

## 2022-05-22 NOTE — Progress Notes (Signed)
   Request made for IR to place left chest pigtail drain for loculated effusion.  Dr Mir has reviewed imaging and feels effusion too small for drain placement Could offer Thoracentesis in IR if CCM team would like.  I have sent messages to Dr Vaughan Browner and Dr Florene Glen.  If need Thora in IR- please place order Thank you

## 2022-05-22 NOTE — Progress Notes (Addendum)
Rounding Note    Patient Name: Caitlyn Newman Date of Encounter: 05/22/2022  [facility] Cardiologist: None   Subjective   No chest pain this morning, breathing has improved.   Inpatient Medications    Scheduled Meds:  aspirin  650 mg Oral TID   Followed by   Derrill Memo ON 05/28/2022] aspirin  325 mg Oral TID   cholecalciferol  2,000 Units Oral Daily   colchicine  0.6 mg Oral BID   diltiazem  30 mg Oral Q6H   [START ON 05/23/2022] enoxaparin (LOVENOX) injection  40 mg Subcutaneous Q24H   ferrous sulfate  325 mg Oral TID PC   folic acid  1 mg Oral Daily   pantoprazole  40 mg Oral Daily   Continuous Infusions:  azithromycin 500 mg (05/21/22 1002)   cefTRIAXone (ROCEPHIN)  IV 1 g (05/22/22 0839)   diltiazem (CARDIZEM) infusion Stopped (05/20/22 1507)   PRN Meds: acetaminophen **OR** acetaminophen, albuterol, ondansetron **OR** ondansetron (ZOFRAN) IV   Vital Signs    Vitals:   05/21/22 0751 05/21/22 1213 05/21/22 1920 05/22/22 0528  BP: (!) 146/63 133/82 133/65 130/60  Pulse: 89  89 78  Resp: 16  18 16  $ Temp: 98.3 F (36.8 C)  98 F (36.7 C) 98 F (36.7 C)  TempSrc: Oral  Oral Oral  SpO2: 97%  98% 98%  Weight:      Height:       No intake or output data in the 24 hours ending 05/22/22 0851    05/21/2022    5:52 AM 05/20/2022    4:25 AM 05/19/2022    5:05 AM  Last 3 Weights  Weight (lbs) 187 lb 3.2 oz 188 lb 4.8 oz 189 lb 9.5 oz  Weight (kg) 84.913 kg 85.412 kg 86 kg      Telemetry    Sinus Rhythm - Personally Reviewed  ECG    No new tracing  Physical Exam   GEN: No acute distress.   Neck: No JVD Cardiac: RRR, no murmurs, rubs, or gallops.  Respiratory: Clear to auscultation bilaterally. GI: Soft, nontender, non-distended  MS: No edema; No deformity. Neuro:  Nonfocal  Psych: Normal affect   Labs    High Sensitivity Troponin:  No results for input(s): "TROPONINIHS" in the last 720 hours.   Chemistry Recent Labs  Lab 05/19/22 0140  05/20/22 0207 05/21/22 0229  NA 135 138 138  K 4.2 3.3* 3.8  CL 104 102 105  CO2 24 26 24  $ GLUCOSE 128* 93 103*  BUN 12 14 13  $ CREATININE 0.84 0.86 0.83  CALCIUM 7.8* 7.9* 7.9*  MG 1.9 1.8 1.7  PROT  --  5.5* 5.4*  ALBUMIN  --  2.2* 2.2*  AST  --  24 20  ALT  --  29 26  ALKPHOS  --  70 71  BILITOT  --  0.8 0.3  GFRNONAA >60 >60 >60  ANIONGAP 7 10 9    $ Lipids No results for input(s): "CHOL", "TRIG", "HDL", "LABVLDL", "LDLCALC", "CHOLHDL" in the last 168 hours.  Hematology Recent Labs  Lab 05/19/22 0140 05/20/22 0207 05/21/22 0229  WBC 17.9* 14.0* 12.9*  RBC 3.50* 3.47* 3.41*  HGB 11.2* 11.1* 10.9*  HCT 33.2* 32.6* 31.7*  MCV 94.9 93.9 93.0  MCH 32.0 32.0 32.0  MCHC 33.7 34.0 34.4  RDW 14.2 14.2 14.1  PLT 232 242 243   Thyroid No results for input(s): "TSH", "FREET4" in the last 168 hours.  BNP Recent Labs  Lab 05/18/22 1601  BNP 196.0*    DDimer No results for input(s): "DDIMER" in the last 168 hours.   Radiology    DG CHEST PORT 1 VIEW  Result Date: 05/21/2022 CLINICAL DATA:  Pleural effusion. EXAM: PORTABLE CHEST 1 VIEW COMPARISON:  May 18, 2022. FINDINGS: Stable cardiomegaly. Stable left pleural effusion with associated left basilar atelectasis or infiltrate. Minimal right basilar subsegmental atelectasis or infiltrate is noted. Bony thorax is unremarkable. IMPRESSION: Stable left basilar opacity as described above. Minimal right basilar opacity is noted as described above. Electronically Signed   By: Marijo Conception M.D.   On: 05/21/2022 13:33    Cardiac Studies   Echo: 05/18/2022   IMPRESSIONS    1. Poor echo windows   2. Moderate pericardial effusion. The pericardial effusion is lateral to  the left ventricle and anterior to the right ventricle. Moderate pleural  effusion in the left lateral region. Right atrium and right ventricle not  well visualized to definitely  rule out cardiac tampoande. IVC not well visualized.   3. Challenging study due  to tachycardia. Left ventricular ejection  fraction, by estimation, is 55 to 60%. The left ventricle has normal  function. The left ventricle has no regional wall motion abnormalities.  Left ventricular diastolic function could not   be evaluated.   4. Right ventricular systolic function was not well visualized. The right  ventricular size is not well visualized. Tricuspid regurgitation signal is  inadequate for assessing PA pressure.   5. The mitral valve is normal in structure. No evidence of mitral valve  regurgitation. No evidence of mitral stenosis.   6. The aortic valve is tricuspid. Aortic valve regurgitation is not  visualized. No aortic stenosis is present.   Comparison(s): No prior Echocardiogram.   FINDINGS   Left Ventricle: Left ventricular ejection fraction, by estimation, is 55  to 60%. The left ventricle has normal function. The left ventricle has no  regional wall motion abnormalities. Definity contrast agent was given IV  to delineate the left ventricular   endocardial borders. The left ventricular internal cavity size was normal  in size. There is no left ventricular hypertrophy. Left ventricular  diastolic function could not be evaluated.   Right Ventricle: The right ventricular size is not well visualized. Right  vetricular wall thickness was not well visualized. Right ventricular  systolic function was not well visualized. Tricuspid regurgitation signal  is inadequate for assessing PA  pressure.   Left Atrium: Left atrial size was normal in size.   Right Atrium: Right atrial size was normal in size.   Pericardium: A moderately sized pericardial effusion is present. The  pericardial effusion is lateral to the left ventricle and anterior to the  right ventricle.   Mitral Valve: The mitral valve is normal in structure. No evidence of  mitral valve regurgitation. No evidence of mitral valve stenosis.   Tricuspid Valve: The tricuspid valve is not well  visualized. Tricuspid  valve regurgitation is not demonstrated. No evidence of tricuspid  stenosis.   Aortic Valve: The aortic valve is tricuspid. Aortic valve regurgitation is  not visualized. No aortic stenosis is present.   Pulmonic Valve: The pulmonic valve was not well visualized. Pulmonic valve  regurgitation is not visualized. No evidence of pulmonic stenosis.   Aorta: The aortic root and ascending aorta are structurally normal, with  no evidence of dilitation.   Venous: The inferior vena cava was not well visualized.   IAS/Shunts: No atrial level shunt detected  by color flow Doppler.   Additional Comments: There is a moderate pleural effusion in the left  lateral region.    Limited Echo: 05/19/2022   IMPRESSIONS     1. Limited Echo to r/o cardiac tamponade.   2. There is a moderate pericardial effusion anterior to the right  ventricle and lateral to the left ventricle. Right atrial collapse in late  diastole is noted. Right ventricle is not well visualized to definitely  rule out tamponade. IVC is dilated and  has <50% collapsibility. Clinical correlation is recommended.   FINDINGS   Pericardium: There is a moderate pericardial effusion anterior to the  right ventricle and lateral to the left ventricle. Right atrial collapse  in late diastole is noted. Right ventricle is not well visualized to  definitely rule out tamponade. IVC is  dilated and has <50% collapsibility. Clinical correlation is recommended.   RIGHT VENTRICLE          IVC  RV Basal diam:  3.70 cm  IVC diam: 2.10 cm   Patient Profile     72 y.o. female with PMH of rheumatoid arthritis on methotrexate presented to ER with 1 week history of SOB, chest discomfort and fever, chills. Admitted to the hospitalist team for the management of acute pericarditis and pneumonia.    Assessment & Plan    Acute pericarditis Moderate pericardial effusion -- Presented with 1 week of shortness of breath, fever and  chills with pleuritic chest pain.  EKG showed slight ST elevation in inferior lateral leads, with PR depression in V5 V6.  Echo 2/9 with moderate pericardial effusion, EF 55 to 60%, RV not well-visualized.  Repeat limited echo with no signs of cardiac tamponade. -- CRP 19.7, Sed rate 40 -- Initially placed on aspirin 1 g every 8 hours x 2 weeks with plans to taper to 500 mg  every week.  She is also on colchicine 0.6 mg twice daily with recommendations for 3 months.  Will drop aspirin dose back to 650 mg TID x1 week, 316m TID x1 week -- continue colchicine 0.649mBID for 3 months  -- continue PPI   Possible Atrial flutter RVR -- developed brief episode of narrow complex tachycardia the morning of 2/11 with return to NSR after about an hour. Initially treated with IV Diltiazem, now transitioned to oral dosing -- no OAC right now with plans for thoracentesis, also increased risk of hemorrhage with pericardial effusion  -- if no further episodes   Pleural effusions -- CT chest with moderated left loculated pleural effusion, small right sided pleural effusion -- planned for thoracentesis in IR   CAP -- CXR with moderate to extensive lingular and LLL atelectasis, moderate R basilar atelectasis -- antibiotics per primary   HFpEF -- received IV lasix, no output recorded. Breathing has improved -- weights trending down by 3lbs, now euvolemic    Per primary Rheumatoid Arthritis Pre diabetes   For questions or updates, please contact [place] consult [email or web address] for contact info under     Signed, LiReino BellisNP  05/22/2022, 8:51 AM    Patient seen, examined. Available data reviewed. Agree with findings, assessment, and plan as outlined by LiReino BellisNP.  The patient is alert, oriented, in no distress.  HEENT is normal, JVP is normal, lungs are diminished in the bases but otherwise clear, heart is regular rate and rhythm with no murmur gallop, abdomen is soft  and nontender, extremities have no edema.  From a cardiac perspective,  I agree with the treatment plan outlined above, which includes continuation of colchicine for 3 months and an aspirin taper over a few weeks.  Plans noted for treatment of pleural effusion with consideration of thoracentesis today.  Will arrange outpatient cardiology follow-up with a repeat echocardiogram in a few weeks in our [place] office.  [place] will sign off.   Medication Recommendations:  ASA/colchicine as outlined above Other recommendations (labs, testing, etc):  limited 2D echo for effusion follow-up (will arrange) Follow up as an outpatient:  will schedule at [facility]  Please call if any questions.  Sherren Mocha, M.D. 05/22/2022 9:52 AM

## 2022-05-23 DIAGNOSIS — I3139 Other pericardial effusion (noninflammatory): Secondary | ICD-10-CM | POA: Diagnosis not present

## 2022-05-23 DIAGNOSIS — I319 Disease of pericardium, unspecified: Secondary | ICD-10-CM | POA: Diagnosis not present

## 2022-05-23 LAB — CBC WITH DIFFERENTIAL/PLATELET
Abs Immature Granulocytes: 0.07 10*3/uL (ref 0.00–0.07)
Basophils Absolute: 0.1 10*3/uL (ref 0.0–0.1)
Basophils Relative: 1 %
Eosinophils Absolute: 0.5 10*3/uL (ref 0.0–0.5)
Eosinophils Relative: 7 %
HCT: 33.5 % — ABNORMAL LOW (ref 36.0–46.0)
Hemoglobin: 11.4 g/dL — ABNORMAL LOW (ref 12.0–15.0)
Immature Granulocytes: 1 %
Lymphocytes Relative: 22 %
Lymphs Abs: 1.7 10*3/uL (ref 0.7–4.0)
MCH: 31.8 pg (ref 26.0–34.0)
MCHC: 34 g/dL (ref 30.0–36.0)
MCV: 93.6 fL (ref 80.0–100.0)
Monocytes Absolute: 0.6 10*3/uL (ref 0.1–1.0)
Monocytes Relative: 8 %
Neutro Abs: 4.9 10*3/uL (ref 1.7–7.7)
Neutrophils Relative %: 61 %
Platelets: 260 10*3/uL (ref 150–400)
RBC: 3.58 MIL/uL — ABNORMAL LOW (ref 3.87–5.11)
RDW: 14.4 % (ref 11.5–15.5)
WBC: 7.9 10*3/uL (ref 4.0–10.5)
nRBC: 0 % (ref 0.0–0.2)

## 2022-05-23 LAB — COMPREHENSIVE METABOLIC PANEL
ALT: 29 U/L (ref 0–44)
AST: 36 U/L (ref 15–41)
Albumin: 2.3 g/dL — ABNORMAL LOW (ref 3.5–5.0)
Alkaline Phosphatase: 78 U/L (ref 38–126)
Anion gap: 8 (ref 5–15)
BUN: 7 mg/dL — ABNORMAL LOW (ref 8–23)
CO2: 24 mmol/L (ref 22–32)
Calcium: 7.8 mg/dL — ABNORMAL LOW (ref 8.9–10.3)
Chloride: 105 mmol/L (ref 98–111)
Creatinine, Ser: 0.8 mg/dL (ref 0.44–1.00)
GFR, Estimated: >60 mL/min (ref >60)
Glucose, Bld: 99 mg/dL (ref 70–99)
Potassium: 4.1 mmol/L (ref 3.5–5.1)
Sodium: 137 mmol/L (ref 135–145)
Total Bilirubin: 0.5 mg/dL (ref 0.3–1.2)
Total Protein: 5.4 g/dL — ABNORMAL LOW (ref 6.5–8.1)

## 2022-05-23 LAB — CULTURE, BLOOD (ROUTINE X 2)
Culture: NO GROWTH
Culture: NO GROWTH
Special Requests: ADEQUATE

## 2022-05-23 LAB — PHOSPHORUS: Phosphorus: 3.3 mg/dL (ref 2.5–4.6)

## 2022-05-23 LAB — MAGNESIUM: Magnesium: 2 mg/dL (ref 1.7–2.4)

## 2022-05-23 LAB — RHEUMATOID FACTOR: Rheumatoid fact SerPl-aCnc: 14.1 IU/mL — ABNORMAL HIGH (ref <14.0)

## 2022-05-23 LAB — LACTATE DEHYDROGENASE: LDH: 290 U/L — ABNORMAL HIGH (ref 98–192)

## 2022-05-23 NOTE — Progress Notes (Signed)
Mobility Specialist Progress Note:   05/23/22 1030  Mobility  Activity Ambulated independently in hallway;Ambulated with assistance in hallway  Level of Assistance Modified independent, requires aide device or extra time  Assistive Device Other (Comment) (IV pole)  Distance Ambulated (ft) 500 ft  Activity Response Tolerated well  Mobility Referral Yes  $Mobility charge 1 Mobility   Pt agreeable to mobility session. Required no physical assistance throughout. Ambulated at a modI level with IV Pole. No c/o SOB throughout. Pt back in bed with all needs met.   Nelta Numbers Mobility Specialist Please contact via SecureChat or  Rehab office at [phone]

## 2022-05-23 NOTE — Plan of Care (Signed)
  Problem: Clinical Measurements: Goal: Cardiovascular complication will be avoided Outcome: Progressing   Problem: Pain Managment: Goal: General experience of comfort will improve Outcome: Progressing   Problem: Clinical Measurements: Goal: Cardiovascular complication will be avoided Outcome: Progressing   Problem: Pain Managment: Goal: General experience of comfort will improve Outcome: Progressing   Problem: Clinical Measurements: Goal: Cardiovascular complication will be avoided Outcome: Progressing   Problem: Pain Managment: Goal: General experience of comfort will improve Outcome: Progressing

## 2022-05-23 NOTE — TOC Initial Note (Signed)
Transition of Care [facility]) - Initial/Assessment Note    Patient Details  Name: Caitlyn Newman MRN: [id number] Date of Birth: Aug 21, 1950  [facility] CM/SW Contact:    Bethena Roys, RN Phone Number: 05/23/2022, 3:50 PM  Clinical Narrative: Patient presented for shortness of breath-post thoracentesis. Patient states she was independent prior to admission. Case Manager discussed home health services and the patient feels that she can manage without services. Case Manager will continue to follow for additional needs as the patient progresses.               Expected Discharge Plan: Home/Self Care Barriers to Discharge: Continued Medical Work up   Patient Goals and CMS Choice Patient states their goals for this hospitalization and ongoing recovery are:: to return home.  Expected Discharge Plan and Services In-house Referral: NA Discharge Planning Services: CM Consult Post Acute Care Choice: NA Living arrangements for the past 2 months: Single Family Home                   DME Agency: NA       HH Arranged: NA    Prior Living Arrangements/Services Living arrangements for the past 2 months: Single Family Home Lives with:: Self Patient language and need for interpreter reviewed:: Yes Do you feel safe going back to the place where you live?: Yes      Need for Family Participation in Patient Care: No (Comment) Care giver support system in place?: No (comment)   Criminal Activity/Legal Involvement Pertinent to Current Situation/Hospitalization: No - Comment as needed  Activities of Daily Living Home Assistive Devices/Equipment: Cane (specify quad or straight) ADL Screening (condition at time of admission) Patient's cognitive ability adequate to safely complete daily activities?: Yes Is the patient deaf or have difficulty hearing?: No Does the patient have difficulty seeing, even when wearing glasses/contacts?: No Does the patient have difficulty  concentrating, remembering, or making decisions?: No Patient able to express need for assistance with ADLs?: Yes Does the patient have difficulty dressing or bathing?: No Independently performs ADLs?: Yes (appropriate for developmental age) Does the patient have difficulty walking or climbing stairs?: No Weakness of Legs: None Weakness of Arms/Hands: None  Permission Sought/Granted Permission sought to share information with : Family Supports, Customer service manager, Case Optician, dispensing granted to share information with : Yes, Verbal Permission Granted              Emotional Assessment Appearance:: Appears stated age Attitude/Demeanor/Rapport: Engaged Affect (typically observed): Appropriate Orientation: : Oriented to Situation, Oriented to  Time, Oriented to Place, Oriented to Self Alcohol / Substance Use: Not Applicable Psych Involvement: No (comment)  Admission diagnosis:  Pericardial effusion [I31.39] Pericarditis [I31.9] Pleural effusion [J90] Hypoxia [R09.02] Influenza-like illness [J11.1] New onset type 2 diabetes mellitus ([place]) [E11.9] Patient Active Problem List   Diagnosis Date Noted   Pleural effusion 05/19/2022   Pericarditis 05/18/2022   Class 1 obesity 05/18/2022   Acute respiratory failure with hypoxia ([place]) 05/18/2022   SIRS (systemic inflammatory response syndrome) ([place]) 05/18/2022   Uncontrolled type 2 diabetes mellitus with hyperglycemia, without long-term current use of insulin ([place]) 05/18/2022   Overweight (BMI 25.0-29.9) 11/25/2013   Postoperative anemia due to acute blood loss 11/25/2013   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

## 2022-05-23 NOTE — Progress Notes (Signed)
[facility] PROGRESS NOTE  Caitlyn Newman (DOB: 06/19/1950) [id number] PCP: Chevis Pretty, FNP   Brief Narrative: Caitlyn Newman is a 72 year old female with a history of rheumatoid arthritis and hypertension presenting with 1 week history of subjective fevers, chills, shortness of breath, cough, and chest discomfort.    She's been admitted with a pericardial effusion and concern for pneumonia with pleural effusions.   Cards following, being treated for pericarditis.  PCCM following, now s/p thoracentesis 2/13.   See below for additional details  Subjective: Shortness of breath much better at rest, still winded with exertion just in the room, but felt better after thora. no fever, no chest pain. +cough.  Objective: BP (!) 150/75 (BP Location: Right Arm)   Pulse 75   Temp 98.6 F (37 C) (Oral)   Resp 20   Ht 5' 4"$  (1.626 m)   Wt 84.6 kg   SpO2 96%   BMI 32.01 kg/m   Gen: No distress Pulm: Diminished at bases without wheezes or crackles.   CV: RRR, no MRG or pitting edema GI: Soft, NT, ND, +BS  Neuro: Alert and oriented. No new focal deficits. Ext: Warm, no deformities Skin: Thora site posteriorly c/d/I, no other rashes, lesions or ulcers on visualized skin   Assessment & Plan: Principal Problem:   Pericarditis Active Problems:   Class 1 obesity   Acute respiratory failure with hypoxia (HCC)   SIRS (systemic inflammatory response syndrome) (HCC)   Uncontrolled type 2 diabetes mellitus with hyperglycemia, without long-term current use of insulin (HCC)   Pleural effusion  Acute respiratory failure with hypoxia: Resolved, due to below   Acute pericarditis: Moderate pericardial effusion without clinical suggestion of tamponade. Echo 2/9 with moderate pericardial effusion, EF 55 to 60%, RV not well-visualized.  Repeat limited echo with no signs of cardiac tamponade. - CRP 19.7, ESR 40. Cardiology consulted, recommending aspirin 675m TID x1 week then  3260mTID x1 week (with daily PPI) and colchicine 0.53m75mo BID x3 months.  - [facility] after discharge.    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  - Continue ceftriaxone/azithromycin  - Follow blood cultures - sputum cx if able to produce - serum fungitell, MRSA PCR negative.  Urine strep negative, urine legionella negative.  Aspergillus Ag.  RVP negative.   - negative covid, influenza, RSV - negative HIV - procalcitonin is 0.62, will recheck in AM.  - Thoracentesis labs are transudative by Light's criteria. Cytology and culture pending (no organisms on gram stain).     Paroxysmal atrial flutter: - Continue (new) oral diltiazem.  - Hold anticoagulation given risk of hemorrhage with pericardial effusion - Will follow up with cardiology  Chronic HFpEF: Appears euvolemic, will not continue standing lasix at this time.  - Follow up.   Prediabetes: HbA1c 5.9%.  - SSI    Essential hypertension:  - No home medications noted, will need PCP follow up.   Obesity: Body mass index is 32.01 kg/m.    Rheumatoid arthritis -Patient takes methotrexate 10 mg on Wednesdays and Thursdays. Will hold this currently given concern for infection. Likely to restart next week.  - Follow up with Dr. SyeDossie Der- Continue folic acid    Diarrhea: Seems to have improved   Left Adrenal Nodule/Right Liver lesion: Incidental findings on CT (indeterminate 1.7 cm hyperdense/enhancing lesion within the posterior right liver and indeterminate 2  cm L adrenal nodule) - Recommend elective MRI of abdomen with and without contrast, can pursue this outpatient  Patrecia Pour, MD [facility] [email or web address] 05/23/2022, 12:58 PM

## 2022-05-23 NOTE — Progress Notes (Signed)
   NAME:  Caitlyn Newman, MRN:  [id number], DOB:  1950/10/09, LOS: 5 ADMISSION DATE:  05/18/2022, CONSULTATION DATE:  05/19/2022 REFERRING MD:  Dr. Florene Glen, CHIEF COMPLAINT: Left Pleural Effusion    History of Present Illness:  71 year old female with PMH rheumatoid arthritis, HTN, DM. Presents to ED on 2/9 with reported one week of fever, chills, shortness of breath, cough with yellow sputum production, chest discomfort. Patient states she has not seen PCP in over 5 years.   On arrival to ED temp 99.1, HR 120s, oxygen saturation 89% on room air. WBC 22.6. Lactic acid 2.7. CT chest with moderate to large pericardial effusion with mild pericardial enhancement concerning for exudative process, moderate left and small right pleural effusions. Cardiology consulted. Recommended treatment for acute pericarditis with aspirin, PPI, colchicine. 2/10 Pulmonary consulted for recommendations for left pleural effusion   Pertinent  Medical History  RA on methotrexate, HTN, DM    Significant Hospital Events: Including procedures, antibiotic start and stop dates in addition to other pertinent events   2/9 Presents to ED  2/12 No acute issues overnight, remains on RA. IR consulted for thoracentesis 2/13 IR thoracentesis performed  Interim History / Subjective:  IR evaluation shows effusion is too small to place chest tube Underwent thoracentesis by IR yesterday.  Pleural fluid studies show transudative effusion.  Objective   Blood pressure (!) 142/64, pulse 76, temperature 98 F (36.7 C), temperature source Oral, resp. rate 20, height 5\' 4"  (1.626 m), weight 84.6 kg, SpO2 96 %.       No intake or output data in the 24 hours ending 05/23/22 1742 Filed Weights   05/20/22 0425 05/21/22 0552 05/23/22 0516  Weight: 85.4 kg 84.9 kg 84.6 kg    Examination: Gen:      No acute distress HEENT:  EOMI, sclera anicteric Neck:     No masses; no thyromegaly Lungs:    Clear to auscultation bilaterally; normal  respiratory effort CV:         Regular rate and rhythm; no murmurs Abd:      + bowel sounds; soft, non-tender; no palpable masses, no distension Ext:    No edema; adequate peripheral perfusion Skin:      Warm and dry; no rash Neuro: alert and oriented x 3 Psych: normal mood and affect   Resolved Hospital Problem list     Assessment & Plan:   Left pleural effusion, suspect infectious in nature  P: Pleural studies showed transudative effusion Continue azithromycin and Ceftriaxone for community-acquired pneumonia PCCM will be available as needed.  Please call with any questions.  Best Practice (right click and "Reselect all SmartList Selections" daily)  Per Primary Team  Signature:   Marshell Garfinkel MD [facility] See Amion for pager  If no response to pager , please call [phone] until 7pm After 7:00 pm call Elink  [phone] 05/23/2022, 5:44 PM

## 2022-05-24 DIAGNOSIS — I319 Disease of pericardium, unspecified: Secondary | ICD-10-CM | POA: Diagnosis not present

## 2022-05-24 LAB — PROCALCITONIN: Procalcitonin: 0.16 ng/mL

## 2022-05-24 LAB — CYTOLOGY - NON PAP

## 2022-05-24 MED ORDER — ASPIRIN 325 MG PO TABS: ORAL_TABLET | ORAL | 0 refills | Status: AC

## 2022-05-24 MED ORDER — CEFDINIR 300 MG PO CAPS: 300.0000 mg | ORAL_CAPSULE | Freq: Two times a day (BID) | ORAL | 0 refills | Status: DC

## 2022-05-24 MED ORDER — COLCHICINE 0.6 MG PO TABS: 0.6000 mg | ORAL_TABLET | Freq: Two times a day (BID) | ORAL | 0 refills | Status: DC

## 2022-05-24 MED ORDER — PANTOPRAZOLE SODIUM 40 MG PO TBEC: 40.0000 mg | DELAYED_RELEASE_TABLET | Freq: Every day | ORAL | 0 refills | Status: DC

## 2022-05-24 MED ORDER — ORAL CARE MOUTH RINSE: 15.0000 mL | OROMUCOSAL | Status: DC | PRN

## 2022-05-24 MED ORDER — DILTIAZEM HCL 30 MG PO TABS: 30.0000 mg | ORAL_TABLET | Freq: Four times a day (QID) | ORAL | 0 refills | Status: DC

## 2022-05-24 NOTE — Discharge Summary (Signed)
Physician Discharge Summary   Patient: Caitlyn Newman MRN: [id number] DOB: [birth year]  Admit date:     05/18/2022  Discharge date: 05/24/22  Discharge Physician: Patrecia Pour   PCP: Chevis Pretty, FNP   Recommendations at discharge:  Follow up with PCP as scheduled 3/7 Follow up with cardiology as scheduled 2/28. A follow up echocardiogram is also scheduled for pericardial effusion being treated with colchicine and aspirin as below. Monitor HR/rhythm, discharged newly on diltiazem 65m q6h. Recommend abdominal MRI with a dn without contrast after discharge to investigate incidental left adrenal nodule and right liver lesion.   Discharge Diagnoses: Principal Problem:   Pericarditis Active Problems:   Class 1 obesity   Acute respiratory failure with hypoxia (HCC)   SIRS (systemic inflammatory response syndrome) (HCC)   Uncontrolled type 2 diabetes mellitus with hyperglycemia, without long-term current use of insulin (HCC)   Pleural effusion   Pericardial effusion   Hospital Course: Gayl GMceversis a 72year old female with a history of rheumatoid arthritis and hypertension presenting with 1 week history of subjective fevers, chills, shortness of breath, cough, and chest discomfort.    She's been admitted with a pericardial effusion and concern for pneumonia with pleural effusions.   Cards following, being treated for pericarditis.  PCCM following, now s/p thoracentesis 2/13 which improved symptoms, showed transudate with no organisms on gram stain. Cytology showed acute and chronic inflammatory cells. Culture negative at time of discharge. Please see below for details.  Assessment and Plan: Acute respiratory failure with hypoxia: Resolved, due to below   Acute pericarditis: Moderate pericardial effusion without clinical suggestion of tamponade. Echo 2/9 with moderate pericardial effusion, EF 55 to 60%, RV not well-visualized.  Repeat limited echo with no signs of  cardiac tamponade. - CRP 19.7, ESR 40. Cardiology consulted, recommending aspirin 6549mTID x1 week then 32551mID x1 week (with daily PPI) and colchicine 0.6mg46m BID x3 months.  - [facility] after [facility].    Sepsis due to CAP with left > right pleural effusions: s/p L thoracentesis of 125cc straw-colored fluid 2/13 by IR.  - Continue to monitor cultures, NGTD at discharge. Continue course of antibiotics after discharge.   - serum fungitell, MRSA PCR negative.  Urine strep negative, urine legionella negative.  Aspergillus Ag.  RVP negative.   - negative covid, influenza, RSV - negative HIV  - Thoracentesis labs are transudative by Light's criteria.    Paroxysmal atrial flutter: - Continue (new) oral diltiazem.  - Hold anticoagulation given risk of hemorrhage with pericardial effusion - Will follow up with cardiology  Chronic HFpEF: Appears euvolemic, will not continue standing lasix at this time.  - Follow up.   Prediabetes: HbA1c 5.9%.  - Carb modified diet   Essential hypertension:  - No home medications noted, will need PCP follow up.   Obesity: Body mass index is 32.01 kg/m.    Rheumatoid arthritis -Patient takes methotrexate 10 mg on Wednesdays and Thursdays. Will hold this currently given concern for infection. Restart next week.  - Follow up with Dr. SyedDossie Der Continue folic acid    Diarrhea: Improved   Left Adrenal Nodule/Right Liver lesion: Incidental findings on CT (indeterminate 1.7 cm hyperdense/enhancing lesion within the posterior right liver and indeterminate 2 cm L adrenal nodule) - Recommend elective MRI of abdomen with and without contrast, can pursue this outpatient  Consultants: Cardiology Procedures performed: Echo, thoracentesis Disposition: Home Diet recommendation:  Cardiac and Carb  modified diet DISCHARGE MEDICATION: Allergies as of 05/24/2022       Reactions   Cortisone    Other  reaction(s): Rash   Penicillins    Other reaction(s): Other Unknown        Medication List     TAKE these medications    aspirin 325 MG tablet Take 2 tablets (650 mg total) by mouth 3 (three) times daily for 4 days, THEN 1 tablet (325 mg total) 3 (three) times daily for 7 days. Start taking on: May 24, 2022   cefdinir 300 MG capsule Commonly known as: OMNICEF Take 1 capsule (300 mg total) by mouth 2 (two) times daily.   colchicine 0.6 MG tablet Take 1 tablet (0.6 mg total) by mouth 2 (two) times daily.   diltiazem 30 MG tablet Commonly known as: CARDIZEM Take 1 tablet (30 mg total) by mouth every 6 (six) hours.   ferrous sulfate 325 (65 FE) MG tablet Take 1 tablet (325 mg total) by mouth 3 (three) times daily after meals.   folic acid 1 MG tablet Commonly known as: FOLVITE Take 1 mg by mouth daily.   methotrexate 2.5 MG tablet Commonly known as: RHEUMATREX Take 10 mg by mouth 2 (two) times a week. Wednesdays and Thursdays   pantoprazole 40 MG tablet Commonly known as: PROTONIX Take 1 tablet (40 mg total) by mouth daily. Start taking on: May 25, 2022   Vitamin D 50 MCG (2000 UT) Caps Take 1 capsule by mouth daily.        Follow-up Information     Baruch Gouty, FNP Follow up on 06/14/2022.   Specialty: Family Medicine Contact information: [place] [address] [phone]         Kathlen Mody, Cadence H, PA-C Follow up on 06/06/2022.   Specialty: Cardiology Contact information: [address] [place] [address] [phone]                Discharge Exam: Filed Weights   05/20/22 0425 05/21/22 0552 05/23/22 0516  Weight: 85.4 kg 84.9 kg 84.6 kg  BP 137/61 (BP Location: Left Arm)   Pulse 71   Temp 97.8 F (36.6 C) (Oral)   Resp 18   Ht 5' 4"$  (1.626 m)   Wt 84.6 kg   SpO2 97%   BMI 32.01 kg/m   Well-appearing female R eye laterally deviated Steady gait 58f ambulation on day of discharge Clear,  nonlabored RRR, no MRG or edema  Condition at discharge: stable  The results of significant diagnostics from this hospitalization (including imaging, microbiology, ancillary and laboratory) are listed below for reference.   Imaging Studies: DG Chest 1 View  Result Date: 05/22/2022 CLINICAL DATA:  Status post left thoracentesis. EXAM: CHEST  1 VIEW COMPARISON:  May 21, 2022. FINDINGS: No pneumothorax is noted status post left thoracentesis. Grossly stable left pleural effusion is noted. IMPRESSION: No pneumothorax status post left thoracentesis. Electronically Signed   By: JMarijo ConceptionM.D.   On: 05/22/2022 15:42   IR THORACENTESIS ASP PLEURAL SPACE W/IMG GUIDE  Result Date: 05/22/2022 INDICATION: Patient with history of arthritis and hypertension. Presented to ED with shortness of breath. Found to have pericardial effusion and pleural effusion. EXAM: ULTRASOUND GUIDED THERAPEUTIC AND DIAGNOSTIC LEFT-SIDED THORACENTESIS MEDICATIONS: Lidocaine 1% 10 mL COMPLICATIONS: None immediate. PROCEDURE: An ultrasound guided thoracentesis was thoroughly discussed with the patient and questions answered. The benefits, risks, alternatives and complications were also discussed. The patient understands and wishes to proceed  with the procedure. Written consent was obtained. Ultrasound was performed to localize and mark an adequate pocket of fluid in the left chest. The area was then prepped and draped in the normal sterile fashion. 1% Lidocaine was used for local anesthesia. Under ultrasound guidance a 6 Fr Safe-T-Centesis catheter was introduced. Thoracentesis was performed. The catheter was removed and a dressing applied. FINDINGS: A total of approximately 120 mL of straw-colored fluid was removed. Samples were sent to the laboratory as requested by the clinical team. IMPRESSION: Successful ultrasound guided therapeutic and diagnostic left-sided thoracentesis yielding 120 mL of pleural fluid. Read by: Rushie Nyhan, NP Electronically Signed   By: Miachel Roux M.D.   On: 05/22/2022 15:36   DG CHEST PORT 1 VIEW  Result Date: 05/21/2022 CLINICAL DATA:  Pleural effusion. EXAM: PORTABLE CHEST 1 VIEW COMPARISON:  May 18, 2022. FINDINGS: Stable cardiomegaly. Stable left pleural effusion with associated left basilar atelectasis or infiltrate. Minimal right basilar subsegmental atelectasis or infiltrate is noted. Bony thorax is unremarkable. IMPRESSION: Stable left basilar opacity as described above. Minimal right basilar opacity is noted as described above. Electronically Signed   By: Marijo Conception M.D.   On: 05/21/2022 13:33   ECHOCARDIOGRAM LIMITED  Result Date: 05/19/2022    ECHOCARDIOGRAM LIMITED REPORT   Patient Name:   MYLISSA STAUB Date of Exam: 05/19/2022 Medical Rec #:  [id number]        Height:       64.0 in Accession #:    [id number]       Weight:       189.6 lb Date of Birth:  October 24, 1950       BSA:          1.912 m Patient Age:    30 years         BP:           139/73 mmHg Patient Gender: F                HR:           108 bpm. Exam Location:  Inpatient Procedure: Limited Echo, Cardiac Doppler and Limited Color Doppler Indications:    I31.3 Pericardial effusion (noninflammatory)  History:        Patient has prior history of Echocardiogram examinations, most                 recent 05/18/2022. Signs/Symptoms:Shortness of Breath; Risk                 Factors:Diabetes and Non-Smoker. Pericarditis; respiratory                 failure.  Sonographer:    Wilkie Aye RVT RCS Referring Phys: [id number] VISHNU P MALLIPEDDI  Sonographer Comments: Technically challenging study due to limited acoustic windows, Technically difficult study due to poor echo windows, suboptimal subcostal window and suboptimal apical window. IMPRESSIONS  1. Limited Echo to r/o cardiac tamponade.  2. There is a moderate pericardial effusion anterior to the right ventricle and lateral to the left ventricle. Right atrial collapse in late  diastole is noted. Right ventricle is not well visualized to definitely rule out tamponade. IVC is dilated and has <50% collapsibility. Clinical correlation is recommended. FINDINGS  Pericardium: There is a moderate pericardial effusion anterior to the right ventricle and lateral to the left ventricle. Right atrial collapse in late diastole is noted. Right ventricle is not well visualized to definitely rule out tamponade. IVC is dilated and has <50%  collapsibility. Clinical correlation is recommended. RIGHT VENTRICLE          IVC RV Basal diam:  3.70 cm  IVC diam: 2.10 cm Vishnu Priya Mallipeddi Electronically signed by Lorelee Cover Mallipeddi Signature Date/Time: 05/19/2022/3:51:02 PM    Final    ECHOCARDIOGRAM COMPLETE  Result Date: 05/18/2022    ECHOCARDIOGRAM REPORT   Patient Name:   CIRI ESTRELA Date of Exam: 05/18/2022 Medical Rec #:  [id number]        Height:       64.0 in Accession #:    [id number]       Weight:       200.0 lb Date of Birth:  05-11-1950       BSA:          1.956 m Patient Age:    86 years         BP:           153/83 mmHg Patient Gender: F                HR:           100 bpm. Exam Location:  [facility] Procedure: 2D Echo, Color Doppler, Cardiac Doppler and Intracardiac            Opacification Agent STAT ECHO Indications:    Pericardial effusion  History:        Patient has no prior history of Echocardiogram examinations.                 Signs/Symptoms:Shortness of Breath, Fever and Chest Pain; Risk                 Factors:Hypertension.  Sonographer:    Eartha Inch Referring Phys: [phone] DAVID TAT  Sonographer Comments: Technically difficult study due to poor echo windows and patient is obese. Image acquisition challenging due to patient body habitus and Image acquisition challenging due to respiratory motion. IMPRESSIONS  1. Poor echo windows  2. Moderate pericardial effusion. The pericardial effusion is lateral to the left ventricle and anterior to the right ventricle. Moderate  pleural effusion in the left lateral region. Right atrium and right ventricle not well visualized to definitely rule out cardiac tampoande. IVC not well visualized.  3. Challenging study due to tachycardia. Left ventricular ejection fraction, by estimation, is 55 to 60%. The left ventricle has normal function. The left ventricle has no regional wall motion abnormalities. Left ventricular diastolic function could not  be evaluated.  4. Right ventricular systolic function was not well visualized. The right ventricular size is not well visualized. Tricuspid regurgitation signal is inadequate for assessing PA pressure.  5. The mitral valve is normal in structure. No evidence of mitral valve regurgitation. No evidence of mitral stenosis.  6. The aortic valve is tricuspid. Aortic valve regurgitation is not visualized. No aortic stenosis is present. Comparison(s): No prior Echocardiogram. FINDINGS  Left Ventricle: Left ventricular ejection fraction, by estimation, is 55 to 60%. The left ventricle has normal function. The left ventricle has no regional wall motion abnormalities. Definity contrast agent was given IV to delineate the left ventricular  endocardial borders. The left ventricular internal cavity size was normal in size. There is no left ventricular hypertrophy. Left ventricular diastolic function could not be evaluated. Right Ventricle: The right ventricular size is not well visualized. Right vetricular wall thickness was not well visualized. Right ventricular systolic function was not well visualized. Tricuspid regurgitation signal is inadequate for assessing PA pressure. Left Atrium: Left atrial  size was normal in size. Right Atrium: Right atrial size was normal in size. Pericardium: A moderately sized pericardial effusion is present. The pericardial effusion is lateral to the left ventricle and anterior to the right ventricle. Mitral Valve: The mitral valve is normal in structure. No evidence of mitral valve  regurgitation. No evidence of mitral valve stenosis. Tricuspid Valve: The tricuspid valve is not well visualized. Tricuspid valve regurgitation is not demonstrated. No evidence of tricuspid stenosis. Aortic Valve: The aortic valve is tricuspid. Aortic valve regurgitation is not visualized. No aortic stenosis is present. Pulmonic Valve: The pulmonic valve was not well visualized. Pulmonic valve regurgitation is not visualized. No evidence of pulmonic stenosis. Aorta: The aortic root and ascending aorta are structurally normal, with no evidence of dilitation. Venous: The inferior vena cava was not well visualized. IAS/Shunts: No atrial level shunt detected by color flow Doppler. Additional Comments: There is a moderate pleural effusion in the left lateral region.  LEFT VENTRICLE PLAX 2D LVIDd:         3.00 cm LVIDs:         2.40 cm LV PW:         1.10 cm LV IVS:        0.90 cm LVOT diam:     2.00 cm LVOT Area:     3.14 cm  LEFT ATRIUM             Index        RIGHT ATRIUM           Index LA diam:        3.00 cm 1.53 cm/m   RA Area:     18.20 cm LA Vol (A2C):   59.7 ml 30.52 ml/m  RA Volume:   44.60 ml  22.80 ml/m LA Vol (A4C):   49.2 ml 25.15 ml/m LA Biplane Vol: 54.4 ml 27.81 ml/m   AORTA Ao Root diam: 3.20 cm Ao Asc diam:  3.00 cm  SHUNTS Systemic Diam: 2.00 cm Vishnu Priya Mallipeddi Electronically signed by Lorelee Cover Mallipeddi Signature Date/Time: 05/18/2022/4:52:38 PM    Final    CT Chest W Contrast  Result Date: 05/18/2022 CLINICAL DATA:  72 year old female with shortness of breath, cough and chills. EXAM: CT CHEST WITH CONTRAST TECHNIQUE: Multidetector CT imaging of the chest was performed during intravenous contrast administration. RADIATION DOSE REDUCTION: This exam was performed according to the departmental dose-optimization program which includes automated exposure control, adjustment of the mA and/or kV according to patient size and/or use of iterative reconstruction technique. CONTRAST:  32m  OMNIPAQUE IOHEXOL 300 MG/ML  SOLN COMPARISON:  05/18/2022 and prior chest radiographs FINDINGS: Cardiovascular: A moderate to large pericardial effusion is identified with possible mild pericardial enhancement. UPPER limits normal heart size identified. There is no evidence of thoracic aortic aneurysm. Mediastinum/Nodes: No enlarged mediastinal, hilar, or axillary lymph nodes. Thyroid gland, trachea, and esophagus demonstrate no significant findings. Lungs/Pleura: A moderate LEFT pleural effusion is noted which may be loculated. A small RIGHT pleural effusion is identified. Moderate to extensive lingular and LEFT LOWER lobe atelectasis noted. Moderate RIGHT basilar atelectasis is identified. There is no evidence of pneumothorax. Upper Abdomen: An indeterminate 1.7 cm hyperdense/enhancing lesion within the posterior RIGHT liver (image 130: Series 2) is noted. An indeterminate 2 cm LEFT adrenal nodule is identified. Musculoskeletal: No acute or suspicious bony abnormalities are noted. Moderate facet arthropathy in the LOWER thoracic and UPPER lumbar spine identified. IMPRESSION: 1. Moderate to large pericardial effusion with possible mild  pericardial enhancement, suggesting exudative process. 2. Moderate LEFT pleural effusion which may be loculated. Small RIGHT pleural effusion. 3. Moderate to extensive lingular and LEFT LOWER lobe atelectasis. Moderate RIGHT basilar atelectasis. Superimposed infection/pneumonia are not excluded in these areas. 4. Indeterminate 1.7 cm hyperdense/enhancing lesion within the posterior RIGHT liver and indeterminate 2 cm LEFT adrenal nodule. Elective MRI of the abdomen with and without contrast recommended for further evaluation. Critical Value/emergent results were called by telephone at the time of interpretation on 05/18/2022 at 11:36 am to provider Fredia Sorrow , who verbally acknowledged these results. Electronically Signed   By: Margarette Canada M.D.   On: 05/18/2022 11:40   DG Chest  Port 1 View  Result Date: 05/18/2022 CLINICAL DATA:  Shortness of breath EXAM: PORTABLE CHEST 1 VIEW COMPARISON:  10/28/2013 FINDINGS: Overall lower lung volumes with bibasilar atelectasis. Heart is enlarged. Left pleural effusion noted with left lower lung collapse/consolidation. Pneumonia not excluded. No pneumothorax. Degenerative changes throughout the spine. IMPRESSION: 1. Lower lung volumes with bibasilar atelectasis. 2. Left pleural effusion and left lower lung collapse/consolidation. Electronically Signed   By: Jerilynn Mages.  Shick M.D.   On: 05/18/2022 08:13    Microbiology: Results for orders placed or performed during the hospital encounter of 05/18/22  Resp panel by RT-PCR (RSV, Flu A&B, Covid) Anterior Nasal Swab     Status: None   Collection Time: 05/18/22  7:59 AM   Specimen: Anterior Nasal Swab  Result Value Ref Range Status   SARS Coronavirus 2 by RT PCR NEGATIVE NEGATIVE Final    Comment: (NOTE) SARS-CoV-2 target nucleic acids are NOT DETECTED.  The SARS-CoV-2 RNA is generally detectable in upper respiratory specimens during the acute phase of infection. The lowest concentration of SARS-CoV-2 viral copies this assay can detect is 138 copies/mL. A negative result does not preclude SARS-Cov-2 infection and should not be used as the sole basis for treatment or other patient management decisions. A negative result may occur with  improper specimen collection/handling, submission of specimen other than nasopharyngeal swab, presence of viral mutation(s) within the areas targeted by this assay, and inadequate number of viral copies(<138 copies/mL). A negative result must be combined with clinical observations, patient history, and epidemiological information. The expected result is Negative.  Fact Sheet for Patients:  EntrepreneurPulse.com.au  Fact Sheet for Healthcare Providers:  IncredibleEmployment.be  This test is no t yet approved or cleared by  the [place] FDA and  has been authorized for detection and/or diagnosis of SARS-CoV-2 by FDA under an Emergency Use Authorization (EUA). This EUA will remain  in effect (meaning this test can be used) for the duration of the COVID-19 declaration under Section 564(b)(1) of the Act, 21 U.S.C.section 360bbb-3(b)(1), unless the authorization is terminated  or revoked sooner.       Influenza A by PCR NEGATIVE NEGATIVE Final   Influenza B by PCR NEGATIVE NEGATIVE Final    Comment: (NOTE) The Xpert Xpress SARS-CoV-2/FLU/RSV plus assay is intended as an aid in the diagnosis of influenza from Nasopharyngeal swab specimens and should not be used as a sole basis for treatment. Nasal washings and aspirates are unacceptable for Xpert Xpress SARS-CoV-2/FLU/RSV testing.  Fact Sheet for Patients: EntrepreneurPulse.com.au  Fact Sheet for Healthcare Providers: IncredibleEmployment.be  This test is not yet approved or cleared by the [place] FDA and has been authorized for detection and/or diagnosis of SARS-CoV-2 by FDA under an Emergency Use Authorization (EUA). This EUA will remain in effect (meaning this test can be used) for  the duration of the COVID-19 declaration under Section 564(b)(1) of the Act, 21 U.S.C. section 360bbb-3(b)(1), unless the authorization is terminated or revoked.     Resp Syncytial Virus by PCR NEGATIVE NEGATIVE Final    Comment: (NOTE) Fact Sheet for Patients: EntrepreneurPulse.com.au  Fact Sheet for Healthcare Providers: IncredibleEmployment.be  This test is not yet approved or cleared by the [place] FDA and has been authorized for detection and/or diagnosis of SARS-CoV-2 by FDA under an Emergency Use Authorization (EUA). This EUA will remain in effect (meaning this test can be used) for the duration of the COVID-19 declaration under Section 564(b)(1) of the Act, 21  U.S.C. section 360bbb-3(b)(1), unless the authorization is terminated or revoked.  Performed at [facility], [address] [place], [place] [address]   Culture, blood (Routine X 2) w Reflex to ID Panel     Status: None   Collection Time: 05/18/22  9:32 AM   Specimen: BLOOD  Result Value Ref Range Status   Specimen Description BLOOD BLOOD RIGHT ARM  Final   Special Requests   Final    BOTTLES DRAWN AEROBIC AND ANAEROBIC Blood Culture adequate volume   Culture   Final    NO GROWTH 5 DAYS Performed at [facility], [address] [place], [place] [address]    Report Status 05/23/2022 FINAL  Final  Culture, blood (Routine X 2) w Reflex to ID Panel     Status: None   Collection Time: 05/18/22  9:32 AM   Specimen: BLOOD  Result Value Ref Range Status   Specimen Description BLOOD BLOOD LEFT HAND  Final   Special Requests   Final    BOTTLES DRAWN AEROBIC AND ANAEROBIC Blood Culture results may not be optimal due to an excessive volume of blood received in culture bottles   Culture   Final    NO GROWTH 5 DAYS Performed at [facility], [address], [place], [place] [address]    Report Status 05/23/2022 FINAL  Final  Respiratory (~20 pathogens) panel by PCR     Status: None   Collection Time: 05/19/22  2:38 PM   Specimen: Nasopharyngeal Swab; Respiratory  Result Value Ref Range Status   Adenovirus NOT DETECTED NOT DETECTED Final   Coronavirus 229E NOT DETECTED NOT DETECTED Final    Comment: (NOTE) The Coronavirus on the Respiratory Panel, DOES NOT test for the novel  Coronavirus (2019 nCoV)    Coronavirus HKU1 NOT DETECTED NOT DETECTED Final   Coronavirus NL63 NOT DETECTED NOT DETECTED Final   Coronavirus OC43 NOT DETECTED NOT DETECTED Final   Metapneumovirus NOT DETECTED NOT DETECTED Final   Rhinovirus / Enterovirus NOT DETECTED NOT DETECTED Final   Influenza A NOT DETECTED NOT DETECTED Final   Influenza B NOT DETECTED NOT DETECTED Final   Parainfluenza Virus 1 NOT DETECTED  NOT DETECTED Final   Parainfluenza Virus 2 NOT DETECTED NOT DETECTED Final   Parainfluenza Virus 3 NOT DETECTED NOT DETECTED Final   Parainfluenza Virus 4 NOT DETECTED NOT DETECTED Final   Respiratory Syncytial Virus NOT DETECTED NOT DETECTED Final   Bordetella pertussis NOT DETECTED NOT DETECTED Final   Bordetella Parapertussis NOT DETECTED NOT DETECTED Final   Chlamydophila pneumoniae NOT DETECTED NOT DETECTED Final   Mycoplasma pneumoniae NOT DETECTED NOT DETECTED Final    Comment: Performed at [facility], [place] [address] [place],  [address]  MRSA Next Gen by PCR, Nasal     Status: None   Collection Time: 05/19/22  2:38 PM   Specimen: Nasal Mucosa; Nasal Swab  Result Value Ref Range Status   MRSA by PCR Next Gen NOT DETECTED NOT DETECTED Final    Comment: (NOTE) The GeneXpert MRSA Assay (FDA approved for NASAL specimens only), is one component of a comprehensive MRSA colonization surveillance program. It is not intended to diagnose MRSA infection nor to guide or monitor treatment for MRSA infections. Test performance is not FDA approved in patients less than 77 years old. Performed at [facility], [place] [address] [place], [place] [address]   Culture, body fluid w Gram Stain-bottle     Status: None (Preliminary result)   Collection Time: 05/22/22  3:25 PM   Specimen: Pleura  Result Value Ref Range Status   Specimen Description PLEURAL  Final   Special Requests LEFT LUNG  Final   Culture   Final    NO GROWTH 2 DAYS Performed at [facility] [address] [place], [place] [address]    Report Status PENDING  Incomplete  Gram stain     Status: None   Collection Time: 05/22/22  3:25 PM   Specimen: Pleura  Result Value Ref Range Status   Specimen Description PLEURAL  Final   Special Requests LEFT LUNG  Final   Gram Stain   Final    RARE WBC PRESENT,BOTH PMN AND MONONUCLEAR NO ORGANISMS SEEN Performed at [facility], [address]  [place], [place] [address]    Report Status 05/22/2022 FINAL  Final    Labs: CBC: Recent Labs  Lab 05/18/22 0811 05/19/22 0140 05/20/22 0207 05/21/22 0229 05/23/22 0225  WBC 22.6* 17.9* 14.0* 12.9* 7.9  NEUTROABS 21.2*  --  11.5* 10.5* 4.9  HGB 12.4 11.2* 11.1* 10.9* 11.4*  HCT 38.2 33.2* 32.6* 31.7* 33.5*  MCV 96.2 94.9 93.9 93.0 93.6  PLT 294 232 242 243 [id number]   Basic Metabolic Panel: Recent Labs  Lab 05/19/22 0140 05/20/22 0207 05/21/22 0229 05/22/22 0647 05/23/22 0225  NA 135 138 138 139 137  K 4.2 3.3* 3.8 4.1 4.1  CL 104 102 105 107 105  CO2 24 26 24 $ 20* 24  GLUCOSE 128* 93 103* 88 99  BUN 12 14 13 10 $ 7*  CREATININE 0.84 0.86 0.83 0.82 0.80  CALCIUM 7.8* 7.9* 7.9* 8.2* 7.8*  MG 1.9 1.8 1.7 2.0 2.0  PHOS  --  3.4 3.9  --  3.3   Liver Function Tests: Recent Labs  Lab 05/20/22 0207 05/21/22 0229 05/23/22 0225  AST 24 20 36  ALT 29 26 29  $ ALKPHOS 70 71 78  BILITOT 0.8 0.3 0.5  PROT 5.5* 5.4* 5.4*  ALBUMIN 2.2* 2.2* 2.3*   CBG: Recent Labs  Lab 05/20/22 0737 05/20/22 1135 05/20/22 1622 05/20/22 2057 05/21/22 0745  GLUCAP 72 121* 94 106* 93    Discharge time spent: greater than 30 minutes.  Signed: Patrecia Pour, MD [facility] 05/24/2022

## 2022-05-24 NOTE — Progress Notes (Signed)
Mobility Specialist Progress Note:   05/24/22 1040  Mobility  Activity Ambulated independently in hallway;Ambulated with assistance in hallway  Level of Assistance Modified independent, requires aide device or extra time  Assistive Device None  Distance Ambulated (ft) 500 ft  Activity Response Tolerated well  Mobility Referral Yes  $Mobility charge 1 Mobility   Pt agreeable to mobility session. Required no physical assist, only incr time. No SOB noted. Pt left sitting EOB with all needs met.   Nelta Numbers Mobility Specialist Please contact via SecureChat or  Rehab office at [phone]

## 2022-05-25 ENCOUNTER — Telehealth: Payer: Self-pay

## 2022-05-25 LAB — ASPERGILLUS ANTIGEN, BAL/SERUM: Aspergillus Ag, BAL/Serum: 0.09 Index (ref 0.00–0.49)

## 2022-05-25 NOTE — Transitions of Care (Post Inpatient/ED Visit) (Signed)
   05/25/2022  Name: Caitlyn Newman MRN: [id number] DOB: Apr 18, 1950  Today's TOC FU Call Status: Today's TOC FU Call Status:: Unsuccessul Call (1st Attempt) Unsuccessful Call (1st Attempt) Date: 05/25/22  Attempted to reach the patient regarding the most recent Inpatient/ED visit.  Follow Up Plan: Additional outreach attempts will be made to reach the patient to complete the Transitions of Care (Post Inpatient/ED visit) call.   Johnney Killian, RN, BSN, CCM Care Management Coordinator [place]/[facility] Phone: [phone]: [phone]

## 2022-05-27 LAB — CULTURE, BODY FLUID W GRAM STAIN -BOTTLE: Culture: NO GROWTH

## 2022-05-28 ENCOUNTER — Telehealth: Payer: Self-pay

## 2022-05-28 NOTE — Transitions of Care (Post Inpatient/ED Visit) (Signed)
   05/28/2022  Name: Caitlyn Newman MRN: [id number] DOB: 02-16-1951  Today's TOC FU Call Status: Today's TOC FU Call Status:: Unsuccessful Call (2nd Attempt) Unsuccessful Call (2nd Attempt) Date: 05/28/22  Attempted to reach the patient regarding the most recent Inpatient/ED visit.  Follow Up Plan: Additional outreach attempts will be made to reach the patient to complete the Transitions of Care (Post Inpatient/ED visit) call.   Johnney Killian, RN, BSN, CCM Care Management Coordinator [place] Phone: [phone]: [phone]

## 2022-05-29 ENCOUNTER — Telehealth: Payer: Self-pay

## 2022-05-29 NOTE — Transitions of Care (Post Inpatient/ED Visit) (Signed)
   05/29/2022  Name: Caitlyn Newman MRN: [id number] DOB: [birth year]  Today's TOC FU Call Status: Today's TOC FU Call Status:: Unsuccessful Call (3rd Attempt) Unsuccessful Call (3rd Attempt) Date: 05/29/22  Attempted to reach the patient regarding the most recent Inpatient/ED visit.  Follow Up Plan: No further outreach attempts will be made at this time. We have been unable to contact the patient.  Johnney Killian, RN, BSN, CCM Care Management Coordinator [place] Phone: [phone]: [phone]

## 2022-06-01 LAB — MISC LABCORP TEST (SEND OUT): Labcorp test code: 832599

## 2022-06-06 ENCOUNTER — Ambulatory Visit: Payer: Medicare HMO | Attending: Medical

## 2022-06-06 ENCOUNTER — Ambulatory Visit: Payer: Medicare HMO | Attending: Medical | Admitting: Medical

## 2022-06-06 ENCOUNTER — Encounter: Payer: Self-pay | Admitting: Medical

## 2022-06-06 VITALS — BP 142/80 | HR 70 | Ht 64.0 in | Wt 179.8 lb

## 2022-06-06 DIAGNOSIS — I5032 Chronic diastolic (congestive) heart failure: Secondary | ICD-10-CM | POA: Diagnosis not present

## 2022-06-06 DIAGNOSIS — I319 Disease of pericardium, unspecified: Secondary | ICD-10-CM

## 2022-06-06 DIAGNOSIS — I3139 Other pericardial effusion (noninflammatory): Secondary | ICD-10-CM

## 2022-06-06 DIAGNOSIS — I471 Supraventricular tachycardia, unspecified: Secondary | ICD-10-CM | POA: Diagnosis not present

## 2022-06-06 MED ORDER — DILTIAZEM HCL ER COATED BEADS 120 MG PO CP24: 120.0000 mg | ORAL_CAPSULE | Freq: Every day | ORAL | 3 refills | Status: DC

## 2022-06-06 NOTE — Patient Instructions (Signed)
Medication Instructions:   Stop Taking Diltiazem 30 mg   Start Taking Diltiazem 120 mg Daily   *If you need a refill on your cardiac medications before your next appointment, please call your pharmacy*   Lab Work: NONE   If you have labs (blood work) drawn today and your tests are completely normal, you will receive your results only by: [place] (if you have MyChart) OR A paper copy in the mail If you have any lab test that is abnormal or we need to change your treatment, we will call you to review the results.   Testing/Procedures: [facility]- Long Term Monitor Instructions   Your physician has requested you wear your ZIO patch monitor___14____days. You may remove it on 06-21-22.   This is a single patch monitor.  [employer] supplies one patch monitor per enrollment.  Additional stickers are not available.   Please do not apply patch if you will be having a Nuclear Stress Test, Echocardiogram, Cardiac CT, MRI, or Chest Xray during the time frame you would be wearing the monitor. The patch cannot be worn during these tests.  You cannot remove and re-apply the ZIO XT patch monitor.   Your ZIO patch monitor will be sent USPS Priority mail from [facility] directly to your home address. The monitor may also be mailed to a PO BOX if home delivery is not available.   It may take 3-5 days to receive your monitor after you have been enrolled.   Once you have received you monitor, please review enclosed instructions.  Your monitor has already been registered assigning a specific monitor serial # to you.   Applying the monitor   Shave hair from upper left chest.   Hold abrader disc by orange tab.  Rub abrader in 40 strokes over left upper chest as indicated in your monitor instructions.   Clean area with 4 enclosed alcohol pads .  Use all pads to assure are is cleaned thoroughly.  Let dry.   Apply patch as indicated in monitor instructions.  Patch will be place under collarbone  on left side of chest with arrow pointing upward.   Rub patch adhesive wings for 2 minutes.Remove white label marked "1".  Remove white label marked "2".  Rub patch adhesive wings for 2 additional minutes.   While looking in a mirror, press and release button in center of patch.  A small green light will flash 3-4 times .  This will be your only indicator the monitor has been turned on.     Do not shower for the first 24 hours.  You may shower after the first 24 hours.   Press button if you feel a symptom. You will hear a small click.  Record Date, Time and Symptom in the Patient Log Book.   When you are ready to remove patch, follow instructions on last 2 pages of Patient Log Book.  Stick patch monitor onto last page of Patient Log Book.   Place Patient Log Book in [place] box.  Use locking tab on box and tape box closed securely.  The [employer] and [employer] has [employer] on it.  Please place in mailbox as soon as possible.  Your physician should have your test results approximately 7 days after the monitor has been mailed back to [facility].   Call [place] at [phone] if you have questions regarding your ZIO XT patch monitor.  Call them immediately if you see an orange light blinking  on your monitor.   If your monitor falls off in less than 4 days contact our Monitor department at [phone].  If your monitor becomes loose or falls off after 4 days call [employer] at [phone] for suggestions on securing your monitor.     Follow-Up: At [facility], you and your health needs are our priority.  As part of our continuing mission to provide you with exceptional heart care, we have created designated Provider Care Teams.  These Care Teams include your primary Cardiologist (physician) and Advanced Practice Providers (APPs -  Physician Assistants and Nurse Practitioners) who all work together to provide you with the care you need, when you need it.  We  recommend signing up for the patient portal called "MyChart".  Sign up information is provided on this After Visit Summary.  MyChart is used to connect with patients for Virtual Visits (Telemedicine).  Patients are able to view lab/test results, encounter notes, upcoming appointments, etc.  Non-urgent messages can be sent to your provider as well.   To learn more about what you can do with MyChart, go to [email or web address].    Your next appointment:   4-6 week(s)  Provider:   You may see Vishnu P Mallipeddi, MD or one of the following Advanced Practice Providers on your designated Care Team:   Bernerd Pho, PA-C  Ermalinda Barrios, PA-C     Other Instructions Thank you for choosing [facility]!

## 2022-06-06 NOTE — Progress Notes (Signed)
Cardiology Office Note:    Date:  06/06/2022   ID:  Caitlyn Newman, DOB [birth year], MRN [id number]  PCP:  Chevis Pretty, [facility] Cardiologist:  Chalmers Guest, MD  [place] Electrophysiologist:  None   Referring MD: Chevis Pretty, *   Chief Complaint: Hospital follow-up  History of Present Illness:    Caitlyn Newman is a 72 y.o. female with a hx of rheumatoid on methotrexate, ?  Hypertension, recent acute pericarditis who presents for hospital follow-up.   The patient was recently admitted for acute pericarditis, moderate pericardial effusion, atrial flutter, pleural effusions, HFpEF and CAP. The patient presented with SOB, fever and chills for 1 week.  EKG showed PR depression and subtle ST elevation in inferior lateral leads.  Echo showed LVEF 55-60%, moderate pericardial effusions with inconclusive evidence of tamponade. CT chest showed pericardial enhancement.  Patient met criteria for acute pericarditis.  Patient was transferred to [facility].  NSAIDs were not started due to AKI.  Patient was started on aspirin at 1000 mg every 8 hours x 2 weeks followed by taper of aspirin.  Patient was also started on colchicine 0.6 mg daily for 3 months.  Patient was started on PPI.  Repeat came back in a 2.7, sed rate of 40.  There was a questionable episode of atrial flutter with brief episode of narrow complex tachycardia morning up to 11 with return to normal sinus after an hour initially treated with IV Dilt.  No anticoagulation was started.  Was also diuresed with IV Lasix and switched over to Lasix 20 mg daily.  Today, the patient reports she has overall doing well since being home. She is doing household activities. She denies chest pain and shortness of breath. She noted BP is mildly elevated at times. She is taking cardizem '30mg'$  daily, sometimes twice daily. She denies LLE, orthopnea, pnd.    Past Medical History:  Diagnosis Date   Anemia     Arthritis    Gout    Pneumonia    as a baby   Wears glasses     Past Surgical History:  Procedure Laterality Date   COLONOSCOPY W/ POLYPECTOMY     IR THORACENTESIS ASP PLEURAL SPACE W/IMG GUIDE  05/22/2022   LYMPH NODE BIOPSY Right 01/13/2013   Procedure: LYMPH NODE BIOPSY RIGHT GROIN;  Surgeon: Joyice Faster. Cornett, MD;  Location: [facility];  Service: General;  Laterality: Right;   NO PAST SURGERIES     TOTAL KNEE ARTHROPLASTY Right 11/24/2013   Procedure: RIGHT TOTAL KNEE ARTHROPLASTY;  Surgeon: Mauri Pole, MD;  Location: [facility];  Service: Orthopedics;  Laterality: Right;    Current Medications: Current Meds  Medication Sig   cefdinir (OMNICEF) 300 MG capsule Take 1 capsule (300 mg total) by mouth 2 (two) times daily.   Cholecalciferol (VITAMIN D) 2000 UNITS CAPS Take 1 capsule by mouth daily.    colchicine 0.6 MG tablet Take 1 tablet (0.6 mg total) by mouth 2 (two) times daily.   diltiazem (CARDIZEM) 30 MG tablet Take 1 tablet (30 mg total) by mouth every 6 (six) hours.   ferrous sulfate 325 (65 FE) MG tablet Take 1 tablet (325 mg total) by mouth 3 (three) times daily after meals.   folic acid (FOLVITE) 1 MG tablet Take 1 mg by mouth daily.   methotrexate (RHEUMATREX) 2.5 MG tablet Take 10 mg by mouth 2 (two) times a week. Wednesdays and Thursdays   pantoprazole (PROTONIX) 40 MG tablet Take  1 tablet (40 mg total) by mouth daily.     Allergies:   Cortisone and Penicillins   Social History   Socioeconomic History   Marital status: Single    Spouse name: Not on file   Number of children: Not on file   Years of education: Not on file   Highest education level: Not on file  Occupational History   Not on file  Tobacco Use   Smoking status: Never   Smokeless tobacco: Never  Vaping Use   Vaping Use: Never used  Substance and Sexual Activity   Alcohol use: No   Drug use: No   Sexual activity: Not on file  Other Topics Concern   Not on file  Social History  Narrative   Not on file   Social Determinants of Health   Financial Resource Strain: Not on file  Food Insecurity: No Food Insecurity (05/19/2022)   Hunger Vital Sign    Worried About Running Out of Food in the Last Year: Never true    Ran Out of Food in the Last Year: Never true  Transportation Needs: No Transportation Needs (05/19/2022)   PRAPARE - Hydrologist (Medical): No    Lack of Transportation (Non-Medical): No  Physical Activity: Not on file  Stress: Not on file  Social Connections: Not on file     Family History: The patient's family history includes Diabetes in her father and mother; Hypertension in her father and mother; Kidney disease in her mother.  ROS:   Please see the history of present illness.     All other systems reviewed and are negative.  EKGs/Labs/Other Studies Reviewed:    The following studies were reviewed today:  Echo: 05/18/2022   IMPRESSIONS    1. Poor echo windows   2. Moderate pericardial effusion. The pericardial effusion is lateral to  the left ventricle and anterior to the right ventricle. Moderate pleural  effusion in the left lateral region. Right atrium and right ventricle not  well visualized to definitely  rule out cardiac tampoande. IVC not well visualized.   3. Challenging study due to tachycardia. Left ventricular ejection  fraction, by estimation, is 55 to 60%. The left ventricle has normal  function. The left ventricle has no regional wall motion abnormalities.  Left ventricular diastolic function could not   be evaluated.   4. Right ventricular systolic function was not well visualized. The right  ventricular size is not well visualized. Tricuspid regurgitation signal is  inadequate for assessing PA pressure.   5. The mitral valve is normal in structure. No evidence of mitral valve  regurgitation. No evidence of mitral stenosis.   6. The aortic valve is tricuspid. Aortic valve regurgitation is not   visualized. No aortic stenosis is present.   Comparison(s): No prior Echocardiogram.   FINDINGS   Left Ventricle: Left ventricular ejection fraction, by estimation, is 55  to 60%. The left ventricle has normal function. The left ventricle has no  regional wall motion abnormalities. Definity contrast agent was given IV  to delineate the left ventricular   endocardial borders. The left ventricular internal cavity size was normal  in size. There is no left ventricular hypertrophy. Left ventricular  diastolic function could not be evaluated.   Right Ventricle: The right ventricular size is not well visualized. Right  vetricular wall thickness was not well visualized. Right ventricular  systolic function was not well visualized. Tricuspid regurgitation signal  is inadequate for assessing PA  pressure.   Left Atrium: Left atrial size was normal in size.   Right Atrium: Right atrial size was normal in size.   Pericardium: A moderately sized pericardial effusion is present. The  pericardial effusion is lateral to the left ventricle and anterior to the  right ventricle.   Mitral Valve: The mitral valve is normal in structure. No evidence of  mitral valve regurgitation. No evidence of mitral valve stenosis.   Tricuspid Valve: The tricuspid valve is not well visualized. Tricuspid  valve regurgitation is not demonstrated. No evidence of tricuspid  stenosis.   Aortic Valve: The aortic valve is tricuspid. Aortic valve regurgitation is  not visualized. No aortic stenosis is present.   Pulmonic Valve: The pulmonic valve was not well visualized. Pulmonic valve  regurgitation is not visualized. No evidence of pulmonic stenosis.   Aorta: The aortic root and ascending aorta are structurally normal, with  no evidence of dilitation.   Venous: The inferior vena cava was not well visualized.   IAS/Shunts: No atrial level shunt detected by color flow Doppler.   Additional Comments: There is a  moderate pleural effusion in the left  lateral region.    Limited Echo: 05/19/2022   IMPRESSIONS     1. Limited Echo to r/o cardiac tamponade.   2. There is a moderate pericardial effusion anterior to the right  ventricle and lateral to the left ventricle. Right atrial collapse in late  diastole is noted. Right ventricle is not well visualized to definitely  rule out tamponade. IVC is dilated and  has <50% collapsibility. Clinical correlation is recommended.   FINDINGS   Pericardium: There is a moderate pericardial effusion anterior to the  right ventricle and lateral to the left ventricle. Right atrial collapse  in late diastole is noted. Right ventricle is not well visualized to  definitely rule out tamponade. IVC is  dilated and has <50% collapsibility. Clinical correlation is recommended.   RIGHT VENTRICLE          IVC  RV Basal diam:  3.70 cm  IVC diam: 2.10 cm   EKG:  EKG is not ordered today.   Recent Labs: 05/18/2022: B Natriuretic Peptide 196.0 05/23/2022: ALT 29; BUN 7; Creatinine, Ser 0.80; Hemoglobin 11.4; Magnesium 2.0; Platelets 260; Potassium 4.1; Sodium 137  Recent Lipid Panel No results found for: "CHOL", "TRIG", "HDL", "CHOLHDL", "VLDL", "LDLCALC", "LDLDIRECT"   Physical Exam:    VS:  BP (!) 142/80   Pulse 70   Ht '5\' 4"'$  (1.626 m)   Wt 179 lb 12.8 oz (81.6 kg)   SpO2 98%   BMI 30.86 kg/m     Wt Readings from Last 3 Encounters:  06/06/22 179 lb 12.8 oz (81.6 kg)  05/23/22 186 lb 8 oz (84.6 kg)  11/24/13 159 lb (72.1 kg)     GEN:  Well nourished, well developed in no acute distress HEENT: Normal NECK: No JVD; No carotid bruits LYMPHATICS: No lymphadenopathy CARDIAC: RRR, no murmurs, rubs, gallops RESPIRATORY:  Clear to auscultation without rales, wheezing or rhonchi  ABDOMEN: Soft, non-tender, non-distended MUSCULOSKELETAL:  No edema; No deformity  SKIN: Warm and dry NEUROLOGIC:  Alert and oriented x 3 PSYCHIATRIC:  Normal affect   ASSESSMENT:     1. Pericarditis, unspecified chronicity, unspecified type   2. Pericardial effusion   3. Chronic diastolic heart failure ([place])   4. SVT (supraventricular tachycardia)    PLAN:    In order of problems listed above:  Acute pericarditis moderate pericardial effusion  and inconclusive evidence of pericardial tamponade Recent admission for acute pericarditis and moderate pericardial effusion with elevated ESR/CRP and EKG changes. Repeat limited echo on 05/19/2022 showed moderate pericardial effusion.  Patient denies any chest pain or shortness of breath.  Patient has finished her aspirin taper. She is taking colchicine 0.6 mg twice daily x 3 months.  Repeat echo has been ordered and scheduled for May 6th.   Chronic diastolic heart failure Patient is euvolemic on exam today.  Echo in the hospital showed LVEF 55 to [id number], diastolic function cannot be evaluated.  He is not on a standing diuretic.  Narrow complex tachycardia/SVT Patient had an hour episode of narrow complex tachycardia in the hospital with return to normal sinus rhythm.  EKG in the hospital shows possible A-fib.  She was not started on anticoagulation since it was 1 episode in the setting of an acute event, risk of hemorrhage with pericardial effusion, as well as CHA2DS2-VASc of 1-2 (female, ? Hypertension).  She was discharged on Cardizem 30 mg Q6 daily.  I will stop this and start Cardizem 120 mg daily. I will order a 2-week heart monitor.    Disposition: Follow up in 4-6 week(s) with MD/APP    Signed, Finley Chevez Ninfa Meeker, PA-C  06/06/2022 2:52 PM    [facility]

## 2022-06-13 ENCOUNTER — Ambulatory Visit (HOSPITAL_COMMUNITY)
Admission: RE | Admit: 2022-06-13 | Discharge: 2022-06-13 | Disposition: A | Discharge status: Home / Self Care | Payer: Medicare HMO | Source: Ambulatory Visit | Attending: Nurse Practitioner | Admitting: Nurse Practitioner

## 2022-06-13 DIAGNOSIS — I3139 Other pericardial effusion (noninflammatory): Secondary | ICD-10-CM | POA: Diagnosis not present

## 2022-06-13 LAB — ECHOCARDIOGRAM LIMITED
Area-P 1/2: 3.53 cm2
S' Lateral: 2.9 cm

## 2022-06-13 NOTE — Progress Notes (Signed)
*  PRELIMINARY RESULTS* Echocardiogram 2D Echocardiogram has been performed.  Samuel Germany 06/13/2022, 2:44 PM

## 2022-06-14 ENCOUNTER — Encounter: Payer: Self-pay | Admitting: Family Medicine

## 2022-06-14 ENCOUNTER — Ambulatory Visit (INDEPENDENT_AMBULATORY_CARE_PROVIDER_SITE_OTHER): Payer: Medicare HMO | Admitting: Family Medicine

## 2022-06-14 VITALS — BP 149/79 | HR 70 | Temp 97.5°F | Ht 64.0 in | Wt 179.0 lb

## 2022-06-14 DIAGNOSIS — E278 Other specified disorders of adrenal gland: Secondary | ICD-10-CM

## 2022-06-14 DIAGNOSIS — D696 Thrombocytopenia, unspecified: Secondary | ICD-10-CM | POA: Diagnosis not present

## 2022-06-14 DIAGNOSIS — K7689 Other specified diseases of liver: Secondary | ICD-10-CM | POA: Diagnosis not present

## 2022-06-14 DIAGNOSIS — M0579 Rheumatoid arthritis with rheumatoid factor of multiple sites without organ or systems involvement: Secondary | ICD-10-CM

## 2022-06-14 DIAGNOSIS — I319 Disease of pericardium, unspecified: Secondary | ICD-10-CM | POA: Diagnosis not present

## 2022-06-14 DIAGNOSIS — Z09 Encounter for follow-up examination after completed treatment for conditions other than malignant neoplasm: Secondary | ICD-10-CM | POA: Diagnosis not present

## 2022-06-14 DIAGNOSIS — D500 Iron deficiency anemia secondary to blood loss (chronic): Secondary | ICD-10-CM | POA: Insufficient documentation

## 2022-06-14 DIAGNOSIS — R6889 Other general symptoms and signs: Secondary | ICD-10-CM | POA: Diagnosis not present

## 2022-06-14 NOTE — Progress Notes (Signed)
Subjective:  Patient ID: Caitlyn Newman, female    DOB: 03/12/1951, 72 y.o.   MRN: [id number]  Patient Care Team: Baruch Gouty, FNP as PCP - General (Family Medicine) Chalmers Guest, MD as PCP - Cardiology (Cardiology)   Chief Complaint:  New Patient (Initial Visit) (Previous [facility] patient ) and Establish Care (05/18/2022 - 05/24/2022 (6 days)/[facility]- Pericarditis/)   HPI: Caitlyn Newman is a 72 y.o. female presenting on 06/14/2022 for New Patient (Initial Visit) (Previous [facility] patient ) and Establish Care (05/18/2022 - 05/24/2022 (6 days)/[facility]- Pericarditis/)   Pt presents today to establish care with PCP. She has a history of RA and is followed by rheumatology on a regular baiss. She was recently admitted to the hospital for pericarditis, pleural effusion, acute respiratory failure with hypoxia, SIRS, obesity, prediabetes, anemia,and hypertension. She has followed up with cardiology since discharge from hospital. Repeat echocardiogram yesterday revealed resolution of pericardial effusion and pericarditis. She was noted to have A-Flutter and was placed on Zio which she is currently wearing. She had a thoracentesis on 05/22/2022 and tolerated well. CT during admission revealed a 1.7 cm liver lesion and a 2 cm left adrenal nodule. MRI follow up recommended. She states she is doing well since discharge home. She is taking medications as prescribed. Denies constipation with iron repletion therapy. No weakness, confusion, polyuria, polyphagia, or polydipsia. No chest pain or shortness of breath.    Relevant past medical, surgical, family, and social history reviewed and updated as indicated.  Allergies and medications reviewed and updated. Data reviewed: Chart in Epic.   Past Medical History:  Diagnosis Date   Anemia    Arthritis    Gout    Pneumonia    as a baby   Wears glasses     Past Surgical History:  Procedure Laterality Date    COLONOSCOPY W/ POLYPECTOMY     IR THORACENTESIS ASP PLEURAL SPACE W/IMG GUIDE  05/22/2022   LYMPH NODE BIOPSY Right 01/13/2013   Procedure: LYMPH NODE BIOPSY RIGHT GROIN;  Surgeon: Joyice Faster. Cornett, MD;  Location: [place];  Service: General;  Laterality: Right;   NO PAST SURGERIES     TOTAL KNEE ARTHROPLASTY Right 11/24/2013   Procedure: RIGHT TOTAL KNEE ARTHROPLASTY;  Surgeon: Mauri Pole, MD;  Location: [facility];  Service: Orthopedics;  Laterality: Right;    Social History   Socioeconomic History   Marital status: Single    Spouse name: Not on file   Number of children: Not on file   Years of education: Not on file   Highest education level: Not on file  Occupational History   Not on file  Tobacco Use   Smoking status: Never   Smokeless tobacco: Never  Vaping Use   Vaping Use: Never used  Substance and Sexual Activity   Alcohol use: No   Drug use: No   Sexual activity: Not Currently  Other Topics Concern   Not on file  Social History Narrative   Not on file   Social Determinants of Health   Financial Resource Strain: Not on file  Food Insecurity: No Food Insecurity (05/19/2022)   Hunger Vital Sign    Worried About Running Out of Food in the Last Year: Never true    Ran Out of Food in the Last Year: Never true  Transportation Needs: No Transportation Needs (05/19/2022)   PRAPARE - Hydrologist (Medical): No  Lack of Transportation (Non-Medical): No  Physical Activity: Not on file  Stress: Not on file  Social Connections: Not on file  Intimate Partner Violence: Not At Risk (05/19/2022)   Humiliation, Afraid, Rape, and Kick questionnaire    Fear of Current or Ex-Partner: No    Emotionally Abused: No    Physically Abused: No    Sexually Abused: No    Outpatient Encounter Medications as of 06/14/2022  Medication Sig   Cholecalciferol (VITAMIN D) 2000 UNITS CAPS Take 1 capsule by mouth daily.    colchicine 0.6 MG tablet  Take 1 tablet (0.6 mg total) by mouth 2 (two) times daily.   diltiazem (CARDIZEM CD) 120 MG 24 hr capsule Take 1 capsule (120 mg total) by mouth daily.   ferrous sulfate 325 (65 FE) MG tablet Take 1 tablet (325 mg total) by mouth 3 (three) times daily after meals.   folic acid (FOLVITE) 1 MG tablet Take 1 mg by mouth daily.   methotrexate (RHEUMATREX) 2.5 MG tablet Take 10 mg by mouth 2 (two) times a week. Wednesdays and Thursdays   pantoprazole (PROTONIX) 40 MG tablet Take 1 tablet (40 mg total) by mouth daily.   [EXPIRED] aspirin 325 MG tablet Take 2 tablets (650 mg total) by mouth 3 (three) times daily for 4 days, THEN 1 tablet (325 mg total) 3 (three) times daily for 7 days.   [DISCONTINUED] cefdinir (OMNICEF) 300 MG capsule Take 1 capsule (300 mg total) by mouth 2 (two) times daily.   [DISCONTINUED] diltiazem (CARDIZEM) 30 MG tablet Take 1 tablet (30 mg total) by mouth every 6 (six) hours.   No facility-administered encounter medications on file as of 06/14/2022.    Allergies  Allergen Reactions   Cortisone     Other reaction(s): Rash   Penicillins     Other reaction(s): Other Unknown   Hydrocortisone Rash   Penicillin G Rash    Review of Systems  Constitutional:  Negative for activity change, appetite change, chills, diaphoresis, fatigue, fever and unexpected weight change.  HENT: Negative.    Eyes: Negative.  Negative for photophobia and visual disturbance.  Respiratory:  Negative for cough, chest tightness and shortness of breath.   Cardiovascular:  Negative for chest pain, palpitations and leg swelling.  Gastrointestinal:  Negative for abdominal pain, blood in stool, constipation, diarrhea, nausea and vomiting.  Endocrine: Negative.   Genitourinary:  Negative for decreased urine volume, difficulty urinating, dysuria, frequency and urgency.  Musculoskeletal:  Positive for arthralgias. Negative for myalgias.  Skin: Negative.   Allergic/Immunologic: Negative.   Neurological:   Negative for dizziness, tremors, seizures, syncope, facial asymmetry, speech difficulty, weakness, light-headedness, numbness and headaches.  Hematological: Negative.  Does not bruise/bleed easily.  Psychiatric/Behavioral:  Negative for confusion, hallucinations, sleep disturbance and suicidal ideas.   All other systems reviewed and are negative.       Objective:  BP (!) 149/79 Comment: patients BP machine  Pulse 70   Temp (!) 97.5 F (36.4 C) (Temporal)   Ht '5\' 4"'$  (1.626 m)   Wt 179 lb (81.2 kg)   SpO2 95%   BMI 30.73 kg/m    Wt Readings from Last 3 Encounters:  06/14/22 179 lb (81.2 kg)  06/06/22 179 lb 12.8 oz (81.6 kg)  05/23/22 186 lb 8 oz (84.6 kg)    Physical Exam Vitals and nursing note reviewed.  Constitutional:      General: She is not in acute distress.    Appearance: Normal appearance. She is well-developed  and well-groomed. She is obese. She is not ill-appearing, toxic-appearing or diaphoretic.  HENT:     Head: Normocephalic and atraumatic.     Jaw: There is normal jaw occlusion.     Right Ear: Hearing normal.     Left Ear: Hearing normal.     Nose: Nose normal.     Mouth/Throat:     Lips: Pink.     Mouth: Mucous membranes are moist.     Pharynx: Oropharynx is clear. Uvula midline.  Eyes:     General: Lids are normal.     Extraocular Movements: Extraocular movements intact.     Conjunctiva/sclera: Conjunctivae normal.     Pupils: Pupils are equal, round, and reactive to light.  Neck:     Thyroid: No thyroid mass, thyromegaly or thyroid tenderness.     Vascular: No carotid bruit or JVD.     Trachea: Trachea and phonation normal.  Cardiovascular:     Rate and Rhythm: Normal rate and regular rhythm.     Chest Wall: PMI is not displaced.     Pulses: Normal pulses.     Heart sounds: Normal heart sounds. No murmur heard.    No friction rub. No gallop.  Pulmonary:     Effort: Pulmonary effort is normal. No respiratory distress.     Breath sounds: Normal  breath sounds. No wheezing.  Abdominal:     General: Bowel sounds are normal. There is no distension or abdominal bruit.     Palpations: Abdomen is soft. There is no hepatomegaly or splenomegaly.     Tenderness: There is no abdominal tenderness. There is no right CVA tenderness or left CVA tenderness.     Hernia: No hernia is present.  Musculoskeletal:        General: Normal range of motion.     Cervical back: Normal range of motion and neck supple.     Right lower leg: No edema.     Left lower leg: No edema.  Lymphadenopathy:     Cervical: No cervical adenopathy.  Skin:    General: Skin is warm and dry.     Capillary Refill: Capillary refill takes less than 2 seconds.     Coloration: Skin is not cyanotic, jaundiced or pale.     Findings: No rash.  Neurological:     General: No focal deficit present.     Mental Status: She is alert and oriented to person, place, and time.     Sensory: Sensation is intact.     Motor: Motor function is intact.     Coordination: Coordination is intact.     Gait: Gait abnormal (using cane).     Deep Tendon Reflexes: Reflexes are normal and symmetric.  Psychiatric:        Attention and Perception: Attention and perception normal.        Mood and Affect: Mood and affect normal.        Speech: Speech normal.        Behavior: Behavior normal. Behavior is cooperative.        Thought Content: Thought content normal.        Cognition and Memory: Cognition and memory normal.        Judgment: Judgment normal.     Results for orders placed or performed during the hospital encounter of 06/13/22  ECHOCARDIOGRAM LIMITED  Result Value Ref Range   Area-P 1/2 3.53 cm2   S' Lateral 2.90 cm   Est EF 60 - 65%  Pertinent labs & imaging results that were available during my care of the patient were reviewed by me and considered in my medical decision making.  Assessment & Plan:  Inice was seen today for new patient (initial visit) and establish  care.  Diagnoses and all orders for this visit:  Hospital discharge follow-up Today's visit was for Transitional Care Management. The patient was discharged from [facility] on 05/24/2022 with a primary diagnosis of pericarditis.  Through chart review and discussion with the patient I have determined that management of their condition is of high complexity.  -     CMP14+EGFR -     Anemia Profile B  Pericarditis, unspecified chronicity, unspecified type Has seen cardiology and repeat echo revealed resolution. Doing well since home. Will repeat labs today.  -     CMP14+EGFR -     Anemia Profile B  Iron deficiency anemia due to chronic blood loss Tolerating iron repletion therapy well. Will repeat labs today.  -     Anemia Profile B  Adrenal nodule (HCC) Liver nodule Recommended MRI ordered. Will repeat labs today.  -     CMP14+EGFR -     MR Abdomen W Wo Contrast; Future  Rheumatoid arthritis involving multiple sites with positive rheumatoid factor ([place]) Followed by rheumatology on a regular basis.  -     CMP14+EGFR -     Anemia Profile B     Continue all other maintenance medications.  Follow up plan: Return in about 3 months (around 09/14/2022) for prediabetes.   Continue healthy lifestyle choices, including diet (rich in fruits, vegetables, and lean proteins, and low in salt and simple carbohydrates) and exercise (at least 30 minutes of moderate physical activity daily).  Educational handout given for IDA  The above assessment and management plan was discussed with the patient. The patient verbalized understanding of and has agreed to the management plan. Patient is aware to call the clinic if they develop any new symptoms or if symptoms persist or worsen. Patient is aware when to return to the clinic for a follow-up visit. Patient educated on when it is appropriate to go to the emergency department.   Monia Pouch, FNP-C [facility] [phone]

## 2022-06-15 NOTE — Addendum Note (Signed)
Addended by: Karle Plumber on: 06/15/2022 03:42 PM   Modules accepted: Orders

## 2022-06-19 LAB — CMP14+EGFR
ALT: 40 IU/L — ABNORMAL HIGH (ref 0–32)
AST: 35 IU/L (ref 0–40)
Albumin/Globulin Ratio: 1.3 (ref 1.2–2.2)
Albumin: 3.8 g/dL — ABNORMAL LOW (ref 3.9–4.9)
Alkaline Phosphatase: 88 IU/L (ref 44–121)
BUN/Creatinine Ratio: 20 (ref 12–28)
BUN: 15 mg/dL (ref 8–27)
Bilirubin Total: 0.5 mg/dL (ref 0.0–1.2)
CO2: 20 mmol/L (ref 20–29)
Calcium: 9.2 mg/dL (ref 8.7–10.3)
Chloride: 103 mmol/L (ref 96–106)
Creatinine, Ser: 0.74 mg/dL (ref 0.57–1.00)
Globulin, Total: 3 g/dL (ref 1.5–4.5)
Glucose: 89 mg/dL (ref 70–99)
Potassium: 3.9 mmol/L (ref 3.5–5.2)
Sodium: 140 mmol/L (ref 134–144)
Total Protein: 6.8 g/dL (ref 6.0–8.5)
eGFR: 88 mL/min/{1.73_m2} (ref >59)

## 2022-06-19 LAB — ANEMIA PROFILE B
Basophils Absolute: 0.1 10*3/uL (ref 0.0–0.2)
Basos: 1 %
EOS (ABSOLUTE): 0.2 10*3/uL (ref 0.0–0.4)
Eos: 3 %
Ferritin: 1045 ng/mL — ABNORMAL HIGH (ref 15–150)
Folate: >20 ng/mL (ref >3.0)
Hematocrit: 39.4 % (ref 34.0–46.6)
Hemoglobin: 12.8 g/dL (ref 11.1–15.9)
Immature Grans (Abs): 0 10*3/uL (ref 0.0–0.1)
Immature Granulocytes: 0 %
Iron Saturation: 46 % (ref 15–55)
Iron: 112 ug/dL (ref 27–139)
Lymphocytes Absolute: 2.7 10*3/uL (ref 0.7–3.1)
Lymphs: 37 %
MCH: 30.3 pg (ref 26.6–33.0)
MCHC: 32.5 g/dL (ref 31.5–35.7)
MCV: 93 fL (ref 79–97)
Monocytes Absolute: 0.4 10*3/uL (ref 0.1–0.9)
Monocytes: 5 %
Neutrophils Absolute: 4 10*3/uL (ref 1.4–7.0)
Neutrophils: 54 %
Platelets: 127 10*3/uL — ABNORMAL LOW (ref 150–450)
RBC: 4.23 x10E6/uL (ref 3.77–5.28)
RDW: 14 % (ref 11.7–15.4)
Retic Ct Pct: 1.3 % (ref 0.6–2.6)
Total Iron Binding Capacity: 243 ug/dL — ABNORMAL LOW (ref 250–450)
UIBC: 131 ug/dL (ref 118–369)
Vitamin B-12: 631 pg/mL (ref 232–1245)
WBC: 7.3 10*3/uL (ref 3.4–10.8)

## 2022-06-20 ENCOUNTER — Telehealth: Payer: Self-pay

## 2022-06-20 NOTE — Telephone Encounter (Signed)
Patient notified and verbalized understanding. Patient had no questions or concerns at this time.  

## 2022-06-20 NOTE — Telephone Encounter (Signed)
-----   Message from Chalmers Guest, MD sent at 06/18/2022  2:52 PM EDT ----- Pericardial effusion is completely resolved compared with the prior echo. Normal heart pumping function.

## 2022-06-21 DIAGNOSIS — E669 Obesity, unspecified: Secondary | ICD-10-CM | POA: Diagnosis not present

## 2022-06-21 DIAGNOSIS — I3139 Other pericardial effusion (noninflammatory): Secondary | ICD-10-CM | POA: Diagnosis not present

## 2022-06-21 DIAGNOSIS — M1990 Unspecified osteoarthritis, unspecified site: Secondary | ICD-10-CM | POA: Diagnosis not present

## 2022-06-21 DIAGNOSIS — M0609 Rheumatoid arthritis without rheumatoid factor, multiple sites: Secondary | ICD-10-CM | POA: Diagnosis not present

## 2022-06-21 DIAGNOSIS — Z79899 Other long term (current) drug therapy: Secondary | ICD-10-CM | POA: Diagnosis not present

## 2022-06-27 DIAGNOSIS — I4719 Other supraventricular tachycardia: Secondary | ICD-10-CM | POA: Diagnosis not present

## 2022-06-27 DIAGNOSIS — I471 Supraventricular tachycardia, unspecified: Secondary | ICD-10-CM | POA: Diagnosis not present

## 2022-07-09 ENCOUNTER — Telehealth: Payer: Self-pay | Admitting: Family Medicine

## 2022-07-09 NOTE — Telephone Encounter (Signed)
Called patient to schedule Medicare Annual Wellness Visit (AWV). Left message for patient to call back and schedule Medicare Annual Wellness Visit (AWV).  Last date of AWV: due 06/07/2016 awvi per [place]  Please schedule an appointment at any time with either Mickel Baas or [place], N[facility]'s. .  If any questions, please contact me at [phone].  Thank you,  Colletta Maryland,  Heath Program Direct Dial ??[phone]

## 2022-07-11 ENCOUNTER — Ambulatory Visit (HOSPITAL_COMMUNITY)
Admission: RE | Admit: 2022-07-11 | Discharge: 2022-07-11 | Disposition: A | Discharge status: Home / Self Care | Payer: Medicare HMO | Source: Ambulatory Visit | Attending: Family Medicine | Admitting: Family Medicine

## 2022-07-11 DIAGNOSIS — K7689 Other specified diseases of liver: Secondary | ICD-10-CM | POA: Diagnosis not present

## 2022-07-11 DIAGNOSIS — K8020 Calculus of gallbladder without cholecystitis without obstruction: Secondary | ICD-10-CM | POA: Diagnosis not present

## 2022-07-11 DIAGNOSIS — E278 Other specified disorders of adrenal gland: Secondary | ICD-10-CM | POA: Diagnosis not present

## 2022-07-11 MED ORDER — GADOBUTROL 1 MMOL/ML IV SOLN
8.0000 mL | Freq: Once | INTRAVENOUS | Status: AC | PRN
  Administered 2022-07-11: 8 mL via INTRAVENOUS

## 2022-07-12 NOTE — Addendum Note (Signed)
Addended by: Baruch Gouty on: 07/12/2022 01:26 PM   Modules accepted: Orders

## 2022-07-16 ENCOUNTER — Telehealth: Payer: Self-pay

## 2022-07-16 NOTE — Telephone Encounter (Signed)
Per DPR ok to leave message on pt's voicemail. Left detailed message of monitor results with instructions for patient to call office back if she had any questions. Results visible in MyChart for PCP to view.

## 2022-07-16 NOTE — Telephone Encounter (Signed)
-----   Message from Parke Poisson, RN sent at 07/12/2022  2:21 PM EDT -----  ----- Message ----- From: Marianne Sofia, PA-C Sent: 06/29/2022   2:15 PM EDT To: Parke Poisson, RN  Heart monitor showed normal rhythm with 1 run of fast heart rate with rare irregular beats. Overall looks good. She has follow-up with MD next month

## 2022-07-19 ENCOUNTER — Encounter: Payer: Self-pay | Admitting: Internal Medicine

## 2022-07-19 ENCOUNTER — Ambulatory Visit: Payer: Medicare HMO | Attending: Internal Medicine | Admitting: Internal Medicine

## 2022-07-19 VITALS — BP 136/76 | HR 82 | Ht 64.0 in | Wt 172.0 lb

## 2022-07-19 DIAGNOSIS — G4733 Obstructive sleep apnea (adult) (pediatric): Secondary | ICD-10-CM

## 2022-07-19 DIAGNOSIS — I4892 Unspecified atrial flutter: Secondary | ICD-10-CM

## 2022-07-19 MED ORDER — RIVAROXABAN 20 MG PO TABS
20.0000 mg | ORAL_TABLET | Freq: Every day | ORAL | 6 refills | Status: DC
Start: 2022-07-19 — End: 2022-08-01

## 2022-07-19 MED ORDER — RIVAROXABAN 20 MG PO TABS: 20.0000 mg | ORAL_TABLET | Freq: Every day | ORAL | 0 refills | Status: DC

## 2022-07-19 NOTE — Patient Instructions (Signed)
Medication Instructions:  Your physician has recommended you make the following change in your medication:   -Start Xarelto 20 mg tablets once daily with food.   *If you need a refill on your cardiac medications before your next appointment, please call your pharmacy*   Lab Work: None If you have labs (blood work) drawn today and your tests are completely normal, you will receive your results only by: MyChart Message (if you have MyChart) OR A paper copy in the mail If you have any lab test that is abnormal or we need to change your treatment, we will call you to review the results.   Testing/Procedures: None   Follow-Up: At [facility], you and your health needs are our priority.  As part of our continuing mission to provide you with exceptional heart care, we have created designated Provider Care Teams.  These Care Teams include your primary Cardiologist (physician) and Advanced Practice Providers (APPs -  Physician Assistants and Nurse Practitioners) who all work together to provide you with the care you need, when you need it.  We recommend signing up for the patient portal called "MyChart".  Sign up information is provided on this After Visit Summary.  MyChart is used to connect with patients for Virtual Visits (Telemedicine).  Patients are able to view lab/test results, encounter notes, upcoming appointments, etc.  Non-urgent messages can be sent to your provider as well.   To learn more about what you can do with MyChart, go to [email or web address].    Your next appointment:   3 month(s)  Provider:   Luane School, MD    Other Instructions You have been referred to Pulmonology. They will contact you with your first appointment.

## 2022-07-20 DIAGNOSIS — I4892 Unspecified atrial flutter: Secondary | ICD-10-CM | POA: Insufficient documentation

## 2022-07-20 NOTE — Progress Notes (Signed)
Cardiology Office Note  Date: 07/20/2022   ID: Caitlyn Newman, DOB [birth year], MRN [id number]  PCP:  Sonny Masters, FNP  Cardiologist:  Marjo Bicker, MD Electrophysiologist:  None   Reason for Office Visit: Follow-up of pericarditis and medical effusion   History of Present Illness: Caitlyn Newman is a 72 y.o. female known to have rheumatoid arthritis, HTN, DM 2, pericarditis with pericardial effusion with no evidence of tamponade, paroxysmal atrial flutter is here for follow-up visit.  Patient was admitted to [facility] for sepsis secondary to community-acquired pneumonia c/w bilateral pleural effusions and acute pericarditis with pericardial effusion with no evidence of cardiac tamponade. Hospital course was complicated by paroxysmal atrial flutter with RVR started on diltiazem drip but AC was not started due to pericardial effusion. She is status post thoracentesis. Patient was discharged on colchicine, diltiazem and currently here for follow-up visit with me. No symptoms of SOB or angina.  Denied dizziness, lightheadedness, syncope, palpitations or leg swelling.  Echocardiogram was repeated after discharge from the hospital which showed complete resolution of medical effusion.  Past Medical History:  Diagnosis Date   Anemia    Arthritis    Gout    Pneumonia    as a baby   Wears glasses     Past Surgical History:  Procedure Laterality Date   COLONOSCOPY W/ POLYPECTOMY     IR THORACENTESIS ASP PLEURAL SPACE W/IMG GUIDE  05/22/2022   LYMPH NODE BIOPSY Right 01/13/2013   Procedure: LYMPH NODE BIOPSY RIGHT GROIN;  Surgeon: Clovis Pu. Cornett, MD;  Location: [facility];  Service: General;  Laterality: Right;   NO PAST SURGERIES     TOTAL KNEE ARTHROPLASTY Right 11/24/2013   Procedure: RIGHT TOTAL KNEE ARTHROPLASTY;  Surgeon: Shelda Pal, MD;  Location: [facility];  Service: Orthopedics;  Laterality: Right;    Current Outpatient Medications   Medication Sig Dispense Refill   Cholecalciferol (VITAMIN D) 2000 UNITS CAPS Take 1 capsule by mouth daily.      colchicine 0.6 MG tablet Take 1 tablet (0.6 mg total) by mouth 2 (two) times daily. 180 tablet 0   diltiazem (CARDIZEM CD) 120 MG 24 hr capsule Take 1 capsule (120 mg total) by mouth daily. 90 capsule 3   ferrous sulfate 325 (65 FE) MG tablet Take 1 tablet (325 mg total) by mouth 3 (three) times daily after meals.  3   folic acid (FOLVITE) 1 MG tablet Take 1 mg by mouth daily.     methotrexate (RHEUMATREX) 2.5 MG tablet Take 10 mg by mouth 2 (two) times a week. Wednesdays and Thursdays     pantoprazole (PROTONIX) 40 MG tablet Take 1 tablet (40 mg total) by mouth daily. 30 tablet 0   rivaroxaban (XARELTO) 20 MG TABS tablet Take 1 tablet (20 mg total) by mouth daily with supper. 30 tablet 6   rivaroxaban (XARELTO) 20 MG TABS tablet Take 1 tablet (20 mg total) by mouth daily with supper. 14 tablet 0   No current facility-administered medications for this visit.   Allergies:  Cortisone, Penicillins, Hydrocortisone, and Penicillin g   Social History: The patient  reports that she has never smoked. She has never used smokeless tobacco. She reports that she does not drink alcohol and does not use drugs.   Family History: The patient's family history includes Bone cancer in her paternal grandfather; Diabetes in her brother, father, maternal grandfather, maternal grandmother, mother, and sister; Hypertension in her father and mother; Kidney  disease in her mother; Ovarian cancer in her paternal aunt.   ROS:  Please see the history of present illness. Otherwise, complete review of systems is positive for none.  All other systems are reviewed and negative.   Physical Exam: VS:  BP 136/76   Pulse 82   Ht  (1.626 m)   Wt 172 lb (78 kg)   SpO2 98%   BMI 29.52 kg/m , BMI Body mass index is 29.52 kg/m.  Wt Readings from Last 3 Encounters:  07/19/22 172 lb (78 kg)  06/14/22 179 lb  (81.2 kg)  06/06/22 179 lb 12.8 oz (81.6 kg)    General: Patient appears comfortable at rest. HEENT: Conjunctiva and lids normal, oropharynx clear with moist mucosa. Neck: Supple, no elevated JVP or carotid bruits, no thyromegaly. Lungs: Clear to auscultation, nonlabored breathing at rest. Cardiac: Regular rate and rhythm, no S3 or significant systolic murmur, no pericardial rub. Abdomen: Soft, nontender, no hepatomegaly, bowel sounds present, no guarding or rebound. Extremities: No pitting edema, distal pulses 2+. Skin: Warm and dry. Musculoskeletal: No kyphosis. Neuropsychiatric: Alert and oriented x3, affect grossly appropriate.  ECG:  NSR  Recent Labwork: 05/18/2022: B Natriuretic Peptide 196.0 05/23/2022: Magnesium 2.0 06/14/2022: ALT 40; AST 35; BUN 15; Creatinine, Ser 0.74; Hemoglobin 12.8; Platelets 127; Potassium 3.9; Sodium 140  No results found for: "CHOL", "TRIG", "HDL", "CHOLHDL", "VLDL", "LDLCALC", "LDLDIRECT"  Other Studies Reviewed Today: Event monitor in 06/2022 No atrial or ventricular arrhythmias 1 brief run of asymptomatic SVT Unremarkable event monitor overall  Echocardiogram on 06/13/2022 Pericardial effusion is completely resolved compared to 05/2022 echocardiogram Normal LVEF  Assessment and Plan:  Patient is a 72 year old F known to have rheumatoid arthritis, HTN, DM 2, pericarditis with pericardial effusion with no evidence of tamponade, paroxysmal atrial flutter is here for follow-up visit.  # Acute pericarditis with pericardial fusion with no tamponade: Patient completed course of high-dose aspirin therapy, currently on colchicine 0.6 mg twice daily. Repeat echocardiogram upon hospital discharge showed complete resolution of medical effusion.  Will continue colchicine 0.6 mg twice daily for an additional 3 months and then will discontinue. # Paroxysmal atrial flutter likely secondary to sepsis and acute pericarditis: Continue diltiazem 120 mg once daily and  start rivaroxaban 20 mg daily with meals.  It is okay to start anticoagulation as repeat echocardiogram showed no evidence of any pericardial fusion. Pulm referral for outpatient OSA evaluation due to atrial flutter.  I have spent a total of 30 minutes with patient reviewing chart, EKGs, labs and examining patient as well as establishing an assessment and plan that was discussed with the patient.  > 50% of time was spent in direct patient care.     Medication Adjustments/Labs and Tests Ordered: Current medicines are reviewed at length with the patient today.  Concerns regarding medicines are outlined above.   Tests Ordered: Orders Placed This Encounter  Procedures   Ambulatory referral to Pulmonology    Medication Changes: Meds ordered this encounter  Medications   rivaroxaban (XARELTO) 20 MG TABS tablet    Sig: Take 1 tablet (20 mg total) by mouth daily with supper.    Dispense:  30 tablet    Refill:  6   rivaroxaban (XARELTO) 20 MG TABS tablet    Sig: Take 1 tablet (20 mg total) by mouth daily with supper.    Dispense:  14 tablet    Refill:  0    Order Specific Question:   Lot Number?    Answer:  [id number]    Order Specific Question:   Expiration Date?    Answer:   01/08/2024    Disposition:  Follow up  3 months  Signed, Chinwe Lope Verne Spurr, MD, 07/20/2022 1:19 PM    [facility] [address], [place], [place] [address]

## 2022-07-23 NOTE — Progress Notes (Deleted)
GI Office Note    Referring Provider: Sonny Masters, FNP Primary Care Physician:  Sonny Masters, FNP  Primary Gastroenterologist: ***  Chief Complaint   No chief complaint on file.   History of Present Illness   Caitlyn Newman is a 72 y.o. female presenting today at the request of Rakes, Doralee Albino, FNP for ***liver nodule.  Labs 06/14/2022: Albumin 3.8, AST 35, ALT 40, alk phos 88, T. bili 0.5, ferritin 1045, hemoglobin 12.8, MCV 93, platelets 127  Labs 06/21/2022: CRP 0.17, hemoglobin 14.1, platelets 188, sodium 142, albumin 3.5, alk phos 103, ALT 74, AST 46, ESR 17  MRI abdomen 07/11/2022 -Liver exam limited by breath motion artifact -No evidence Rehman normality or abnormal contrast-enhancement to correspond to hypodense focus seen by prior CT -Gallstones present without any wall thickening or biliary dilation -Unremarkable pancreas and spleen -Moderate burden of stool in the colon -Fat-containing left adrenal adenoma -Recommended multiphasic contrast-enhanced CT for further evaluation of right lobe of liver lesion  Today:    Current Outpatient Medications  Medication Sig Dispense Refill   Cholecalciferol (VITAMIN D) 2000 UNITS CAPS Take 1 capsule by mouth daily.      colchicine 0.6 MG tablet Take 1 tablet (0.6 mg total) by mouth 2 (two) times daily. 180 tablet 0   diltiazem (CARDIZEM CD) 120 MG 24 hr capsule Take 1 capsule (120 mg total) by mouth daily. 90 capsule 3   ferrous sulfate 325 (65 FE) MG tablet Take 1 tablet (325 mg total) by mouth 3 (three) times daily after meals.  3   folic acid (FOLVITE) 1 MG tablet Take 1 mg by mouth daily.     methotrexate (RHEUMATREX) 2.5 MG tablet Take 10 mg by mouth 2 (two) times a week. Wednesdays and Thursdays     pantoprazole (PROTONIX) 40 MG tablet Take 1 tablet (40 mg total) by mouth daily. 30 tablet 0   rivaroxaban (XARELTO) 20 MG TABS tablet Take 1 tablet (20 mg total) by mouth daily with supper. 30 tablet 6   rivaroxaban  (XARELTO) 20 MG TABS tablet Take 1 tablet (20 mg total) by mouth daily with supper. 14 tablet 0   No current facility-administered medications for this visit.    Past Medical History:  Diagnosis Date   Anemia    Arthritis    Gout    Pneumonia    as a baby   Wears glasses     Past Surgical History:  Procedure Laterality Date   COLONOSCOPY W/ POLYPECTOMY     IR THORACENTESIS ASP PLEURAL SPACE W/IMG GUIDE  05/22/2022   LYMPH NODE BIOPSY Right 01/13/2013   Procedure: LYMPH NODE BIOPSY RIGHT GROIN;  Surgeon: Clovis Pu. Cornett, MD;  Location: [facility];  Service: General;  Laterality: Right;   NO PAST SURGERIES     TOTAL KNEE ARTHROPLASTY Right 11/24/2013   Procedure: RIGHT TOTAL KNEE ARTHROPLASTY;  Surgeon: Shelda Pal, MD;  Location: [facility];  Service: Orthopedics;  Laterality: Right;    Family History  Problem Relation Age of Onset   Diabetes Mother    Kidney disease Mother    Hypertension Mother    Hypertension Father    Diabetes Father    Diabetes Sister    Diabetes Brother    Ovarian cancer Paternal Aunt    Diabetes Maternal Grandmother    Diabetes Maternal Grandfather    Bone cancer Paternal Grandfather     Allergies as of 07/24/2022 - Review Complete 06/14/2022  Allergen Reaction Noted   Cortisone  01/05/2013   Penicillins  10/15/2012   Hydrocortisone Rash 06/14/2022   Penicillin g Rash 06/14/2022    Social History   Socioeconomic History   Marital status: Single    Spouse name: Not on file   Number of children: Not on file   Years of education: Not on file   Highest education level: Not on file  Occupational History   Not on file  Tobacco Use   Smoking status: Never   Smokeless tobacco: Never  Vaping Use   Vaping Use: Never used  Substance and Sexual Activity   Alcohol use: No   Drug use: No   Sexual activity: Not Currently  Other Topics Concern   Not on file  Social History Narrative   Not on file   Social Determinants of  Health   Financial Resource Strain: Not on file  Food Insecurity: No Food Insecurity (05/19/2022)   Hunger Vital Sign    Worried About Running Out of Food in the Last Year: Never true    Ran Out of Food in the Last Year: Never true  Transportation Needs: No Transportation Needs (05/19/2022)   PRAPARE - Administrator, Civil Service (Medical): No    Lack of Transportation (Non-Medical): No  Physical Activity: Not on file  Stress: Not on file  Social Connections: Not on file  Intimate Partner Violence: Not At Risk (05/19/2022)   Humiliation, Afraid, Rape, and Kick questionnaire    Fear of Current or Ex-Partner: No    Emotionally Abused: No    Physically Abused: No    Sexually Abused: No     Review of Systems   Gen: Denies any fever, chills, fatigue, weight loss, lack of appetite.  CV: Denies chest pain, heart palpitations, peripheral edema, syncope.  Resp: Denies shortness of breath at rest or with exertion. Denies wheezing or cough.  GI: see HPI GU : Denies urinary burning, urinary frequency, urinary hesitancy MS: Denies joint pain, muscle weakness, cramps, or limitation of movement.  Derm: Denies rash, itching, dry skin Psych: Denies depression, anxiety, memory loss, and confusion Heme: Denies bruising, bleeding, and enlarged lymph nodes.   Physical Exam   There were no vitals taken for this visit.  General:   Alert and oriented. Pleasant and cooperative. Well-nourished and well-developed.  Head:  Normocephalic and atraumatic. Eyes:  Without icterus, sclera clear and conjunctiva pink.  Ears:  Normal auditory acuity. Mouth:  No deformity or lesions, oral mucosa pink.  Lungs:  Clear to auscultation bilaterally. No wheezes, rales, or rhonchi. No distress.  Heart:  S1, S2 present without murmurs appreciated.  Abdomen:  +BS, soft, non-tender and non-distended. No HSM noted. No guarding or rebound. No masses appreciated.  Rectal:  Deferred  Msk:  Symmetrical without  gross deformities. Normal posture. Extremities:  Without edema. Neurologic:  Alert and  oriented x4;  grossly normal neurologically. Skin:  Intact without significant lesions or rashes. Psych:  Alert and cooperative. Normal mood and affect.   Assessment   Caitlyn Newman is a 72 y.o. female with a history of anemia, gout, arthritis*** presenting today for evaluation of liver lesion and elevated LFTs.  Liver lesion, elevated ALT:    PLAN   ***    Brooke Bonito, MSN, FNP-BC, AGACNP-BC [facility]

## 2022-07-24 ENCOUNTER — Ambulatory Visit: Payer: Medicare HMO | Admitting: Gastroenterology

## 2022-07-24 ENCOUNTER — Encounter: Payer: Self-pay | Admitting: Gastroenterology

## 2022-07-26 ENCOUNTER — Emergency Department (HOSPITAL_COMMUNITY)
Admission: EM | Admit: 2022-07-26 | Discharge: 2022-07-26 | Discharge status: Against Medical Advice | Payer: Medicare HMO | Attending: Emergency Medicine | Admitting: Emergency Medicine

## 2022-07-26 ENCOUNTER — Encounter (HOSPITAL_COMMUNITY): Payer: Self-pay

## 2022-07-26 ENCOUNTER — Other Ambulatory Visit: Payer: Self-pay

## 2022-07-26 DIAGNOSIS — Z5321 Procedure and treatment not carried out due to patient leaving prior to being seen by health care provider: Secondary | ICD-10-CM | POA: Insufficient documentation

## 2022-07-26 DIAGNOSIS — R0981 Nasal congestion: Secondary | ICD-10-CM | POA: Diagnosis not present

## 2022-07-26 DIAGNOSIS — R519 Headache, unspecified: Secondary | ICD-10-CM | POA: Diagnosis not present

## 2022-07-26 DIAGNOSIS — R059 Cough, unspecified: Secondary | ICD-10-CM | POA: Diagnosis not present

## 2022-07-26 NOTE — ED Triage Notes (Signed)
Pt from home, comes to ed with c/o "catching a cold", pt reports nasal congestion, headache, and cough x 2 days. Pt afebrile, denies chills. Pt ambulatory with walker, NAD.

## 2022-07-30 ENCOUNTER — Telehealth: Payer: Self-pay

## 2022-07-30 NOTE — Telephone Encounter (Signed)
        Patient  visited [place] on 4/18     Telephone encounter attempt :  1st  A HIPAA compliant voice message was left requesting a return call.  Instructed patient to call back     Lenard Forth [facility] Guide, [facility] [phone] [address] [place], [place], [place] [address] Phone: [phone] Email: [email or web address]

## 2022-07-31 ENCOUNTER — Telehealth: Payer: Self-pay

## 2022-07-31 NOTE — Telephone Encounter (Signed)
        Patient  visited [place] on 4/18   Telephone encounter attempt :  2nd  A HIPAA compliant voice message was left requesting a return call.  Instructed patient to call back.    Lenard Forth [facility] Guide, [facility] [phone] [address] [place], [place], [place] [address] Phone: [phone] Email: [email or web address]

## 2022-08-01 ENCOUNTER — Ambulatory Visit (INDEPENDENT_AMBULATORY_CARE_PROVIDER_SITE_OTHER): Payer: Medicare HMO | Admitting: Family Medicine

## 2022-08-01 ENCOUNTER — Encounter: Payer: Self-pay | Admitting: Family Medicine

## 2022-08-01 VITALS — BP 140/83 | HR 69 | Temp 97.4°F | Ht 64.0 in | Wt 170.4 lb

## 2022-08-01 DIAGNOSIS — R062 Wheezing: Secondary | ICD-10-CM

## 2022-08-01 DIAGNOSIS — J069 Acute upper respiratory infection, unspecified: Secondary | ICD-10-CM | POA: Diagnosis not present

## 2022-08-01 MED ORDER — DOXYCYCLINE HYCLATE 100 MG PO TABS
100.0000 mg | ORAL_TABLET | Freq: Two times a day (BID) | ORAL | 0 refills | Status: AC
Start: 2022-08-01 — End: 2022-08-11

## 2022-08-01 MED ORDER — PREDNISONE 20 MG PO TABS
40.0000 mg | ORAL_TABLET | Freq: Every day | ORAL | 0 refills | Status: AC
Start: 2022-08-01 — End: 2022-08-06

## 2022-08-01 NOTE — Progress Notes (Signed)
Subjective:  Patient ID: Caitlyn Newman, female    DOB: 12/14/1950, 72 y.o.   MRN: [id number]  Patient Care Team: Sonny Masters, FNP as PCP - General (Family Medicine) Mallipeddi, Orion Modest, MD as PCP - Cardiology (Cardiology)   Chief Complaint:  Cough and Nasal Congestion (X 1 week)   HPI: Caitlyn Newman is a 72 y.o. female presenting on 08/01/2022 for Cough and Nasal Congestion (X 1 week)   Cough This is a new problem. Episode onset: 7-8 days ago. The problem has been gradually worsening. The cough is Productive of sputum. Associated symptoms include nasal congestion, postnasal drip, rhinorrhea and wheezing. Pertinent negatives include no chest pain, chills, ear congestion, ear pain, fever, headaches, heartburn, hemoptysis, myalgias, rash, sore throat, shortness of breath, sweats or weight loss. Nothing aggravates the symptoms. She has tried OTC cough suppressant for the symptoms. The treatment provided no relief.  URI  This is a new problem. Episode onset: 7-8 days ago. The problem has been gradually worsening. Associated symptoms include congestion, coughing, rhinorrhea and wheezing. Pertinent negatives include no abdominal pain, chest pain, diarrhea, dysuria, ear pain, headaches, joint pain, joint swelling, nausea, neck pain, plugged ear sensation, rash, sinus pain, sneezing, sore throat, swollen glands or vomiting. She has tried decongestant for the symptoms. The treatment provided no relief.     Relevant past medical, surgical, family, and social history reviewed and updated as indicated.  Allergies and medications reviewed and updated. Data reviewed: Chart in Epic.   Past Medical History:  Diagnosis Date   Anemia    Arthritis    Gout    Pneumonia    as a baby   Wears glasses     Past Surgical History:  Procedure Laterality Date   COLONOSCOPY W/ POLYPECTOMY     IR THORACENTESIS ASP PLEURAL SPACE W/IMG GUIDE  05/22/2022   LYMPH NODE BIOPSY Right 01/13/2013    Procedure: LYMPH NODE BIOPSY RIGHT GROIN;  Surgeon: Clovis Pu. Cornett, MD;  Location: [facility];  Service: General;  Laterality: Right;   NO PAST SURGERIES     TOTAL KNEE ARTHROPLASTY Right 11/24/2013   Procedure: RIGHT TOTAL KNEE ARTHROPLASTY;  Surgeon: Shelda Pal, MD;  Location: [facility];  Service: Orthopedics;  Laterality: Right;    Social History   Socioeconomic History   Marital status: Single    Spouse name: Not on file   Number of children: Not on file   Years of education: Not on file   Highest education level: Not on file  Occupational History   Not on file  Tobacco Use   Smoking status: Never   Smokeless tobacco: Never  Vaping Use   Vaping Use: Never used  Substance and Sexual Activity   Alcohol use: No   Drug use: No   Sexual activity: Not Currently  Other Topics Concern   Not on file  Social History Narrative   Not on file   Social Determinants of Health   Financial Resource Strain: Not on file  Food Insecurity: No Food Insecurity (05/19/2022)   Hunger Vital Sign    Worried About Running Out of Food in the Last Year: Never true    Ran Out of Food in the Last Year: Never true  Transportation Needs: No Transportation Needs (05/19/2022)   PRAPARE - Administrator, Civil Service (Medical): No    Lack of Transportation (Non-Medical): No  Physical Activity: Not on file  Stress: Not on file  Social  Connections: Not on file  Intimate Partner Violence: Not At Risk (05/19/2022)   Humiliation, Afraid, Rape, and Kick questionnaire    Fear of Current or Ex-Partner: No    Emotionally Abused: No    Physically Abused: No    Sexually Abused: No    Outpatient Encounter Medications as of 08/01/2022  Medication Sig   Cholecalciferol (VITAMIN D) 2000 UNITS CAPS Take 1 capsule by mouth daily.    colchicine 0.6 MG tablet Take 1 tablet (0.6 mg total) by mouth 2 (two) times daily.   diltiazem (CARDIZEM CD) 120 MG 24 hr capsule Take 1 capsule (120 mg  total) by mouth daily.   doxycycline (VIBRA-TABS) 100 MG tablet Take 1 tablet (100 mg total) by mouth 2 (two) times daily for 10 days. 1 po bid   ferrous sulfate 325 (65 FE) MG tablet Take 1 tablet (325 mg total) by mouth 3 (three) times daily after meals.   folic acid (FOLVITE) 1 MG tablet Take 1 mg by mouth daily.   methotrexate (RHEUMATREX) 2.5 MG tablet Take 10 mg by mouth 2 (two) times a week. Wednesdays and Thursdays   pantoprazole (PROTONIX) 40 MG tablet Take 1 tablet (40 mg total) by mouth daily.   predniSONE (DELTASONE) 20 MG tablet Take 2 tablets (40 mg total) by mouth daily with breakfast for 5 days. 2 po daily for 5 days   rivaroxaban (XARELTO) 20 MG TABS tablet Take 1 tablet (20 mg total) by mouth daily with supper.   [DISCONTINUED] rivaroxaban (XARELTO) 20 MG TABS tablet Take 1 tablet (20 mg total) by mouth daily with supper.   No facility-administered encounter medications on file as of 08/01/2022.    Allergies  Allergen Reactions   Penicillins     Other reaction(s): Other Unknown   Penicillin G Rash    Review of Systems  Constitutional:  Positive for activity change and appetite change. Negative for chills, diaphoresis, fatigue, fever, unexpected weight change and weight loss.  HENT:  Positive for congestion, postnasal drip and rhinorrhea. Negative for dental problem, drooling, ear discharge, ear pain, facial swelling, hearing loss, mouth sores, nosebleeds, sinus pressure, sinus pain, sneezing, sore throat, tinnitus, trouble swallowing and voice change.   Eyes: Negative.   Respiratory:  Positive for cough and wheezing. Negative for apnea, hemoptysis, choking, chest tightness, shortness of breath and stridor.   Cardiovascular:  Negative for chest pain, palpitations and leg swelling.  Gastrointestinal:  Negative for abdominal pain, blood in stool, constipation, diarrhea, heartburn, nausea and vomiting.  Endocrine: Negative.   Genitourinary:  Negative for decreased urine  volume, dysuria, frequency and urgency.  Musculoskeletal:  Negative for arthralgias, joint pain, myalgias and neck pain.  Skin: Negative.  Negative for rash.  Allergic/Immunologic: Negative.   Neurological:  Negative for dizziness, tremors, seizures, syncope, facial asymmetry, speech difficulty, weakness, light-headedness, numbness and headaches.  Hematological: Negative.   Psychiatric/Behavioral:  Negative for confusion, hallucinations, sleep disturbance and suicidal ideas.   All other systems reviewed and are negative.       Objective:  BP (!) 140/83   Pulse 69   Temp (!) 97.4 F (36.3 C) (Temporal)   Ht 5\' 4"  (1.626 m)   Wt 170 lb 6.4 oz (77.3 kg)   SpO2 96%   BMI 29.25 kg/m    Wt Readings from Last 3 Encounters:  08/01/22 170 lb 6.4 oz (77.3 kg)  07/26/22 171 lb 15.3 oz (78 kg)  07/19/22 172 lb (78 kg)    Physical Exam Vitals and  nursing note reviewed.  Constitutional:      General: She is not in acute distress.    Appearance: Normal appearance. She is well-developed and well-groomed. She is not ill-appearing, toxic-appearing or diaphoretic.  HENT:     Head: Normocephalic and atraumatic.     Jaw: There is normal jaw occlusion.     Right Ear: Hearing, tympanic membrane, ear canal and external ear normal.     Left Ear: Hearing, tympanic membrane, ear canal and external ear normal.     Nose: Congestion and rhinorrhea present.     Mouth/Throat:     Lips: Pink.     Mouth: Mucous membranes are moist.     Pharynx: Oropharynx is clear. Uvula midline. No posterior oropharyngeal erythema.  Eyes:     General: Lids are normal.     Extraocular Movements: Extraocular movements intact.     Conjunctiva/sclera: Conjunctivae normal.     Pupils: Pupils are equal, round, and reactive to light.  Neck:     Thyroid: No thyroid mass, thyromegaly or thyroid tenderness.     Vascular: No carotid bruit or JVD.     Trachea: Trachea and phonation normal.  Cardiovascular:     Rate and  Rhythm: Normal rate and regular rhythm.     Chest Wall: PMI is not displaced.     Pulses: Normal pulses.     Heart sounds: Normal heart sounds. No murmur heard.    No friction rub. No gallop.  Pulmonary:     Effort: Pulmonary effort is normal. No respiratory distress.     Breath sounds: Wheezing and rhonchi (clears with cough) present.  Abdominal:     General: Bowel sounds are normal. There is no distension or abdominal bruit.     Palpations: Abdomen is soft. There is no hepatomegaly or splenomegaly.     Tenderness: There is no abdominal tenderness. There is no right CVA tenderness or left CVA tenderness.     Hernia: No hernia is present.  Musculoskeletal:        General: Normal range of motion.     Cervical back: Normal range of motion and neck supple.     Right lower leg: No edema.     Left lower leg: No edema.  Lymphadenopathy:     Cervical: No cervical adenopathy.  Skin:    General: Skin is warm and dry.     Capillary Refill: Capillary refill takes less than 2 seconds.     Coloration: Skin is not cyanotic, jaundiced or pale.     Findings: No rash.  Neurological:     General: No focal deficit present.     Mental Status: She is alert and oriented to person, place, and time.     Sensory: Sensation is intact.     Motor: Motor function is intact.     Coordination: Coordination is intact.     Gait: Gait abnormal (using cane).     Deep Tendon Reflexes: Reflexes are normal and symmetric.  Psychiatric:        Attention and Perception: Attention and perception normal.        Mood and Affect: Mood and affect normal.        Speech: Speech normal.        Behavior: Behavior normal. Behavior is cooperative.        Thought Content: Thought content normal.        Cognition and Memory: Cognition and memory normal.        Judgment: Judgment normal.  Results for orders placed or performed in visit on 06/14/22  CMP14+EGFR  Result Value Ref Range   Glucose 89 70 - 99 mg/dL   BUN 15 8  - 27 mg/dL   Creatinine, Ser 1.61 0.57 - 1.00 mg/dL   eGFR 88 >09 UE/AVW/0.98   BUN/Creatinine Ratio 20 12 - 28   Sodium 140 134 - 144 mmol/L   Potassium 3.9 3.5 - 5.2 mmol/L   Chloride 103 96 - 106 mmol/L   CO2 20 20 - 29 mmol/L   Calcium 9.2 8.7 - 10.3 mg/dL   Total Protein 6.8 6.0 - 8.5 g/dL   Albumin 3.8 (L) 3.9 - 4.9 g/dL   Globulin, Total 3.0 1.5 - 4.5 g/dL   Albumin/Globulin Ratio 1.3 1.2 - 2.2   Bilirubin Total 0.5 0.0 - 1.2 mg/dL   Alkaline Phosphatase 88 44 - 121 IU/L   AST 35 0 - 40 IU/L   ALT 40 (H) 0 - 32 IU/L  Anemia Profile B  Result Value Ref Range   Total Iron Binding Capacity 243 (L) 250 - 450 ug/dL   UIBC 119 147 - 829 ug/dL   Iron 562 27 - 130 ug/dL   Iron Saturation 46 15 - 55 %   Ferritin 1,045 (H) 15 - 150 ng/mL   Vitamin B-12 631 232 - 1,245 pg/mL   Folate >20.0 >3.0 ng/mL   WBC 7.3 3.4 - 10.8 x10E3/uL   RBC 4.23 3.77 - 5.28 x10E6/uL   Hemoglobin 12.8 11.1 - 15.9 g/dL   Hematocrit 86.5 78.4 - 46.6 %   MCV 93 79 - 97 fL   MCH 30.3 26.6 - 33.0 pg   MCHC 32.5 31.5 - 35.7 g/dL   RDW 69.6 29.5 - 28.4 %   Platelets 127 (L) 150 - 450 x10E3/uL   Neutrophils 54 Not Estab. %   Lymphs 37 Not Estab. %   Monocytes 5 Not Estab. %   Eos 3 Not Estab. %   Basos 1 Not Estab. %   Neutrophils Absolute 4.0 1.4 - 7.0 x10E3/uL   Lymphocytes Absolute 2.7 0.7 - 3.1 x10E3/uL   Monocytes Absolute 0.4 0.1 - 0.9 x10E3/uL   EOS (ABSOLUTE) 0.2 0.0 - 0.4 x10E3/uL   Basophils Absolute 0.1 0.0 - 0.2 x10E3/uL   Immature Granulocytes 0 Not Estab. %   Immature Grans (Abs) 0.0 0.0 - 0.1 x10E3/uL   Retic Ct Pct 1.3 0.6 - 2.6 %       Pertinent labs & imaging results that were available during my care of the patient were reviewed by me and considered in my medical decision making.  Assessment & Plan:  Shazia was seen today for cough and nasal congestion.  Diagnoses and all orders for this visit:  URI with cough and congestion Wheezing Pt aware to continue Mucinex at home.  Will add doxycycline and prednisone. Pt aware of red flags which require emergent evaluation and treatment. Follow up in 1-2 weeks if symptoms have not completely resolved.  -     doxycycline (VIBRA-TABS) 100 MG tablet; Take 1 tablet (100 mg total) by mouth 2 (two) times daily for 10 days. 1 po bid -     predniSONE (DELTASONE) 20 MG tablet; Take 2 tablets (40 mg total) by mouth daily with breakfast for 5 days. 2 po daily for 5 days     Continue all other maintenance medications.  Follow up plan: Return if symptoms worsen or fail to improve.   Continue healthy lifestyle choices, including  diet (rich in fruits, vegetables, and lean proteins, and low in salt and simple carbohydrates) and exercise (at least 30 minutes of moderate physical activity daily).  Educational handout given for URI   The above assessment and management plan was discussed with the patient. The patient verbalized understanding of and has agreed to the management plan. Patient is aware to call the clinic if they develop any new symptoms or if symptoms persist or worsen. Patient is aware when to return to the clinic for a follow-up visit. Patient educated on when it is appropriate to go to the emergency department.   Kari Baars, FNP-C [facility] [phone]

## 2022-08-07 ENCOUNTER — Telehealth: Payer: Self-pay | Admitting: Family Medicine

## 2022-08-07 NOTE — Telephone Encounter (Signed)
Called patient to schedule Medicare Annual Wellness Visit (AWV). Left message for patient to call back and schedule Medicare Annual Wellness Visit (AWV).  Last date of AWV:  due 06/07/2016 awvi per [facility]  Please schedule an appointment at any time with Abby, NHA. .  If any questions, please contact me at [phone].  Thank you,  Judeth Cornfield,  AMB Clinical Support [facility] AWV Program Direct Dial ??[phone]

## 2022-08-22 ENCOUNTER — Other Ambulatory Visit: Payer: Self-pay | Admitting: Family Medicine

## 2022-09-14 ENCOUNTER — Ambulatory Visit: Payer: Medicare HMO | Admitting: Family Medicine

## 2022-09-18 ENCOUNTER — Encounter: Payer: Self-pay | Admitting: Family Medicine

## 2022-09-18 ENCOUNTER — Ambulatory Visit (INDEPENDENT_AMBULATORY_CARE_PROVIDER_SITE_OTHER): Payer: Medicare HMO | Admitting: Family Medicine

## 2022-09-18 VITALS — BP 127/59 | HR 65 | Temp 97.5°F | Ht 64.0 in | Wt 171.8 lb

## 2022-09-18 DIAGNOSIS — M069 Rheumatoid arthritis, unspecified: Secondary | ICD-10-CM

## 2022-09-18 DIAGNOSIS — D500 Iron deficiency anemia secondary to blood loss (chronic): Secondary | ICD-10-CM

## 2022-09-18 DIAGNOSIS — Z136 Encounter for screening for cardiovascular disorders: Secondary | ICD-10-CM | POA: Diagnosis not present

## 2022-09-18 DIAGNOSIS — I319 Disease of pericardium, unspecified: Secondary | ICD-10-CM

## 2022-09-18 DIAGNOSIS — R6889 Other general symptoms and signs: Secondary | ICD-10-CM | POA: Diagnosis not present

## 2022-09-18 DIAGNOSIS — R7303 Prediabetes: Secondary | ICD-10-CM | POA: Insufficient documentation

## 2022-09-18 DIAGNOSIS — E559 Vitamin D deficiency, unspecified: Secondary | ICD-10-CM

## 2022-09-18 LAB — BAYER DCA HB A1C WAIVED: HB A1C (BAYER DCA - WAIVED): 5.7 % — ABNORMAL HIGH (ref 4.8–5.6)

## 2022-09-18 MED ORDER — COLCHICINE 0.6 MG PO TABS
0.6000 mg | ORAL_TABLET | Freq: Two times a day (BID) | ORAL | 0 refills | Status: DC
Start: 2022-09-18 — End: 2022-10-08

## 2022-09-18 NOTE — Progress Notes (Signed)
Subjective:  Patient ID: Caitlyn Newman, female    DOB: 01/29/1951, 72 y.o.   MRN: [id number]  Patient Care Team: Sonny Masters, FNP as PCP - General (Family Medicine) Mallipeddi, Orion Modest, MD as PCP - Cardiology (Cardiology)   Chief Complaint:  Prediabetes (3 month follow up )   HPI: Caitlyn Newman is a 72 y.o. female presenting on 09/18/2022 for Prediabetes (3 month follow up )   Prediabetes  Not on medications. Denies polyuria, polyphagia, or polydipsia. No weight changes. No weakness or confusion.   Pericarditis associated with rheumatoid arthritis Followed by cardiology on a regular basis. On colchicine and should continue this until July per cardiology recommendations.   Iron deficiency anemia due to chronic blood loss  Denies abnormal bleeding or bruising. No weakness or confusion.   Vitamin D deficiency   Pt is taking oral repletion therapy. Denies bone pain and tenderness, muscle weakness, fracture, and difficulty walking. No results found for: "VD25OH" Lab Results  Component Value Date   CALCIUM 9.2 06/14/2022   PHOS 3.3 05/23/2022     Relevant past medical, surgical, family, and social history reviewed and updated as indicated.  Allergies and medications reviewed and updated. Data reviewed: Chart in Epic.   Past Medical History:  Diagnosis Date   Anemia    Arthritis    Gout    Pneumonia    as a baby   Wears glasses     Past Surgical History:  Procedure Laterality Date   COLONOSCOPY W/ POLYPECTOMY     IR THORACENTESIS ASP PLEURAL SPACE W/IMG GUIDE  05/22/2022   LYMPH NODE BIOPSY Right 01/13/2013   Procedure: LYMPH NODE BIOPSY RIGHT GROIN;  Surgeon: Clovis Pu. Cornett, MD;  Location: [facility];  Service: General;  Laterality: Right;   NO PAST SURGERIES     TOTAL KNEE ARTHROPLASTY Right 11/24/2013   Procedure: RIGHT TOTAL KNEE ARTHROPLASTY;  Surgeon: Shelda Pal, MD;  Location: [facility];  Service: Orthopedics;  Laterality: Right;     Social History   Socioeconomic History   Marital status: Single    Spouse name: Not on file   Number of children: Not on file   Years of education: Not on file   Highest education level: Not on file  Occupational History   Not on file  Tobacco Use   Smoking status: Never   Smokeless tobacco: Never  Vaping Use   Vaping Use: Never used  Substance and Sexual Activity   Alcohol use: No   Drug use: No   Sexual activity: Not Currently  Other Topics Concern   Not on file  Social History Narrative   Not on file   Social Determinants of Health   Financial Resource Strain: Not on file  Food Insecurity: No Food Insecurity (05/19/2022)   Hunger Vital Sign    Worried About Running Out of Food in the Last Year: Never true    Ran Out of Food in the Last Year: Never true  Transportation Needs: No Transportation Needs (05/19/2022)   PRAPARE - Administrator, Civil Service (Medical): No    Lack of Transportation (Non-Medical): No  Physical Activity: Not on file  Stress: Not on file  Social Connections: Not on file  Intimate Partner Violence: Not At Risk (05/19/2022)   Humiliation, Afraid, Rape, and Kick questionnaire    Fear of Current or Ex-Partner: No    Emotionally Abused: No    Physically Abused: No  Sexually Abused: No    Outpatient Encounter Medications as of 09/18/2022  Medication Sig   Cholecalciferol (VITAMIN D) 2000 UNITS CAPS Take 1 capsule by mouth daily.    diltiazem (CARDIZEM CD) 120 MG 24 hr capsule Take 1 capsule (120 mg total) by mouth daily.   ferrous sulfate 325 (65 FE) MG tablet Take 1 tablet (325 mg total) by mouth 3 (three) times daily after meals.   methotrexate (RHEUMATREX) 2.5 MG tablet Take 10 mg by mouth 2 (two) times a week. Wednesdays and Thursdays   pantoprazole (PROTONIX) 40 MG tablet Take 1 tablet (40 mg total) by mouth daily.   rivaroxaban (XARELTO) 20 MG TABS tablet Take 1 tablet (20 mg total) by mouth daily with supper.    colchicine 0.6 MG tablet Take 1 tablet (0.6 mg total) by mouth 2 (two) times daily.   [DISCONTINUED] colchicine 0.6 MG tablet Take 1 tablet (0.6 mg total) by mouth 2 (two) times daily. (Patient not taking: Reported on 09/18/2022)   [DISCONTINUED] folic acid (FOLVITE) 1 MG tablet Take 1 mg by mouth daily. (Patient not taking: Reported on 09/18/2022)   No facility-administered encounter medications on file as of 09/18/2022.    Allergies  Allergen Reactions   Penicillins     Other reaction(s): Other Unknown   Penicillin G Rash    Review of Systems  Constitutional:  Negative for activity change, appetite change, chills, diaphoresis, fatigue, fever and unexpected weight change.  HENT: Negative.    Eyes: Negative.   Respiratory:  Negative for cough, chest tightness and shortness of breath.   Cardiovascular:  Negative for chest pain, palpitations and leg swelling.  Gastrointestinal:  Negative for abdominal pain, blood in stool, constipation, diarrhea, nausea and vomiting.  Endocrine: Negative.  Negative for cold intolerance, heat intolerance, polydipsia, polyphagia and polyuria.  Genitourinary:  Negative for decreased urine volume, difficulty urinating, dysuria, frequency and urgency.  Musculoskeletal:  Positive for arthralgias and gait problem. Negative for myalgias.  Skin: Negative.   Allergic/Immunologic: Negative.   Neurological:  Negative for dizziness, tremors, seizures, syncope, facial asymmetry, speech difficulty, weakness, light-headedness, numbness and headaches.  Hematological: Negative.   Psychiatric/Behavioral:  Negative for confusion, hallucinations, sleep disturbance and suicidal ideas.   All other systems reviewed and are negative.       Objective:  BP (!) 127/59   Pulse 65   Temp (!) 97.5 F (36.4 C) (Temporal)   Ht 5\' 4"  (1.626 m)   Wt 171 lb 12.8 oz (77.9 kg)   SpO2 95%   BMI 29.49 kg/m    Wt Readings from Last 3 Encounters:  09/18/22 171 lb 12.8 oz (77.9 kg)   08/01/22 170 lb 6.4 oz (77.3 kg)  07/26/22 171 lb 15.3 oz (78 kg)    Physical Exam Vitals and nursing note reviewed.  Constitutional:      General: She is not in acute distress.    Appearance: Normal appearance. She is well-developed, well-groomed and overweight. She is not ill-appearing, toxic-appearing or diaphoretic.  HENT:     Head: Normocephalic and atraumatic.     Jaw: There is normal jaw occlusion.     Right Ear: Hearing normal.     Left Ear: Hearing normal.     Nose: Nose normal.     Mouth/Throat:     Lips: Pink.     Mouth: Mucous membranes are moist.     Pharynx: Oropharynx is clear. Uvula midline.  Eyes:     General: Lids are normal.  Extraocular Movements: Extraocular movements intact.     Conjunctiva/sclera: Conjunctivae normal.     Pupils: Pupils are equal, round, and reactive to light.  Neck:     Thyroid: No thyroid mass, thyromegaly or thyroid tenderness.     Vascular: No carotid bruit or JVD.     Trachea: Trachea and phonation normal.  Cardiovascular:     Rate and Rhythm: Normal rate and regular rhythm.     Chest Wall: PMI is not displaced.     Pulses: Normal pulses.     Heart sounds: Normal heart sounds. No murmur heard.    No friction rub. No gallop.  Pulmonary:     Effort: Pulmonary effort is normal. No respiratory distress.     Breath sounds: Normal breath sounds. No wheezing.  Abdominal:     General: Bowel sounds are normal. There is no distension or abdominal bruit.     Palpations: Abdomen is soft. There is no hepatomegaly or splenomegaly.     Tenderness: There is no abdominal tenderness. There is no right CVA tenderness or left CVA tenderness.     Hernia: No hernia is present.  Musculoskeletal:        General: Normal range of motion.     Cervical back: Normal range of motion and neck supple.     Right lower leg: No edema.     Left lower leg: No edema.  Lymphadenopathy:     Cervical: No cervical adenopathy.  Skin:    General: Skin is warm  and dry.     Capillary Refill: Capillary refill takes less than 2 seconds.     Coloration: Skin is not cyanotic, jaundiced or pale.     Findings: No rash.  Neurological:     General: No focal deficit present.     Mental Status: She is alert and oriented to person, place, and time.     Sensory: Sensation is intact.     Motor: Motor function is intact.     Coordination: Coordination is intact.     Gait: Gait abnormal (using cane).     Deep Tendon Reflexes: Reflexes are normal and symmetric.  Psychiatric:        Attention and Perception: Attention and perception normal.        Mood and Affect: Mood and affect normal.        Speech: Speech normal.        Behavior: Behavior normal. Behavior is cooperative.        Thought Content: Thought content normal.        Cognition and Memory: Cognition and memory normal.        Judgment: Judgment normal.     Results for orders placed or performed in visit on 09/18/22  Bayer DCA Hb A1c Waived  Result Value Ref Range   HB A1C (BAYER DCA - WAIVED) 5.7 (H) 4.8 - 5.6 %       Pertinent labs & imaging results that were available during my care of the patient were reviewed by me and considered in my medical decision making.  Assessment & Plan:  Lucee was seen today for prediabetes.  Diagnoses and all orders for this visit:  Prediabetes A1C 5.7 today. Continue with dietary changes.  -     CBC with Differential/Platelet -     Lipid panel -     VITAMIN D 25 Hydroxy (Vit-D Deficiency, Fractures) -     Bayer DCA Hb A1c Waived -     Microalbumin / creatinine  urine ratio  Pericarditis associated with rheumatoid arthritis, unspecified chronicity [facility]) Per cardiology recommendations, pt should continue colchicine until July, pt is out of medication, will refill today. Has follow up with cardiology next month.  -     colchicine 0.6 MG tablet; Take 1 tablet (0.6 mg total) by mouth 2 (two) times daily.  Iron deficiency anemia due to chronic blood  loss Will repeat labs today.  -     CBC with Differential/Platelet  Vitamin D deficiency Labs pending. Continue repletion therapy. If indicated, will change repletion dosage. Eat foods rich in Vit D including milk, orange juice, yogurt with vitamin D added, salmon or mackerel, canned tuna fish, cereals with vitamin D added, and cod liver oil. Get out in the sun but make sure to wear at least SPF 30 sunscreen.  -     VITAMIN D 25 Hydroxy (Vit-D Deficiency, Fractures)     Continue all other maintenance medications.  Follow up plan: Return in about 4 months (around 01/18/2023), or if symptoms worsen or fail to improve, for CPE, schedule AWV.   Continue healthy lifestyle choices, including diet (rich in fruits, vegetables, and lean proteins, and low in salt and simple carbohydrates) and exercise (at least 30 minutes of moderate physical activity daily).  Educational handout given for health maintenance   The above assessment and management plan was discussed with the patient. The patient verbalized understanding of and has agreed to the management plan. Patient is aware to call the clinic if they develop any new symptoms or if symptoms persist or worsen. Patient is aware when to return to the clinic for a follow-up visit. Patient educated on when it is appropriate to go to the emergency department.   Kari Baars, FNP-C [facility] [phone]

## 2022-09-19 LAB — CBC WITH DIFFERENTIAL/PLATELET
Basophils Absolute: 0.1 10*3/uL (ref 0.0–0.2)
Basos: 1 %
EOS (ABSOLUTE): 0.1 10*3/uL (ref 0.0–0.4)
Eos: 1 %
Hematocrit: 38.3 % (ref 34.0–46.6)
Hemoglobin: 12.7 g/dL (ref 11.1–15.9)
Immature Grans (Abs): 0 10*3/uL (ref 0.0–0.1)
Immature Granulocytes: 0 %
Lymphocytes Absolute: 2.3 10*3/uL (ref 0.7–3.1)
Lymphs: 29 %
MCH: 30.4 pg (ref 26.6–33.0)
MCHC: 33.2 g/dL (ref 31.5–35.7)
MCV: 92 fL (ref 79–97)
Monocytes Absolute: 0.3 10*3/uL (ref 0.1–0.9)
Monocytes: 4 %
Neutrophils Absolute: 4.9 10*3/uL (ref 1.4–7.0)
Neutrophils: 65 %
Platelets: 151 10*3/uL (ref 150–450)
RBC: 4.18 x10E6/uL (ref 3.77–5.28)
RDW: 15.2 % (ref 11.7–15.4)
WBC: 7.7 10*3/uL (ref 3.4–10.8)

## 2022-09-19 LAB — LIPID PANEL
Chol/HDL Ratio: 2.6 ratio (ref 0.0–4.4)
Cholesterol, Total: 166 mg/dL (ref 100–199)
HDL: 64 mg/dL (ref >39)
LDL Chol Calc (NIH): 91 mg/dL (ref 0–99)
Triglycerides: 53 mg/dL (ref 0–149)
VLDL Cholesterol Cal: 11 mg/dL (ref 5–40)

## 2022-09-19 LAB — VITAMIN D 25 HYDROXY (VIT D DEFICIENCY, FRACTURES): Vit D, 25-Hydroxy: 33.8 ng/mL (ref 30.0–100.0)

## 2022-10-01 DIAGNOSIS — M1990 Unspecified osteoarthritis, unspecified site: Secondary | ICD-10-CM | POA: Diagnosis not present

## 2022-10-01 DIAGNOSIS — Z79899 Other long term (current) drug therapy: Secondary | ICD-10-CM | POA: Diagnosis not present

## 2022-10-01 DIAGNOSIS — M0609 Rheumatoid arthritis without rheumatoid factor, multiple sites: Secondary | ICD-10-CM | POA: Diagnosis not present

## 2022-10-01 DIAGNOSIS — E669 Obesity, unspecified: Secondary | ICD-10-CM | POA: Diagnosis not present

## 2022-10-01 DIAGNOSIS — I3139 Other pericardial effusion (noninflammatory): Secondary | ICD-10-CM | POA: Diagnosis not present

## 2022-10-08 ENCOUNTER — Encounter: Payer: Self-pay | Admitting: Internal Medicine

## 2022-10-08 ENCOUNTER — Ambulatory Visit: Payer: Medicare HMO | Attending: Internal Medicine | Admitting: Internal Medicine

## 2022-10-08 VITALS — BP 132/70 | HR 79 | Ht 64.0 in | Wt 171.2 lb

## 2022-10-08 DIAGNOSIS — I4892 Unspecified atrial flutter: Secondary | ICD-10-CM | POA: Diagnosis not present

## 2022-10-08 DIAGNOSIS — M069 Rheumatoid arthritis, unspecified: Secondary | ICD-10-CM

## 2022-10-08 DIAGNOSIS — I319 Disease of pericardium, unspecified: Secondary | ICD-10-CM | POA: Diagnosis not present

## 2022-10-08 NOTE — Progress Notes (Signed)
Cardiology Office Note  Date: 10/08/2022   ID: Caitlyn Newman, DOB Oct 04, 1950, MRN [id number]  PCP:  Caitlyn Masters, FNP  Cardiologist:  Caitlyn Bicker, MD Electrophysiologist:  None    History of Present Illness: Caitlyn Newman is a 72 y.o. female known to have rheumatoid arthritis, HTN, DM 2, pericarditis with pericardial effusion with no evidence of tamponade, paroxysmal atrial flutter is here for follow-up visit.  Patient was admitted to [facility] for sepsis secondary to community-acquired pneumonia c/w bilateral pleural effusions and acute pericarditis with pericardial effusion with no evidence of cardiac tamponade in 2/24. Hospital course was complicated by paroxysmal atrial flutter with RVR (lasted for 1 hour) started on diltiazem drip but AC was not started due to pericardial effusion. She is status post thoracentesis. Echocardiogram upon hospital discharge showed complete resolution of pericardial effusion.  Event monitor in 3/24 showed no evidence of atrial arrhythmias.  No symptoms of SOB or angina.  Denied dizziness, lightheadedness, syncope, palpitations or leg swelling.   Past Medical History:  Diagnosis Date   Anemia    Arthritis    Gout    Pneumonia    as a baby   Wears glasses     Past Surgical History:  Procedure Laterality Date   COLONOSCOPY W/ POLYPECTOMY     IR THORACENTESIS ASP PLEURAL SPACE W/IMG GUIDE  05/22/2022   LYMPH NODE BIOPSY Right 01/13/2013   Procedure: LYMPH NODE BIOPSY RIGHT GROIN;  Surgeon: Caitlyn Pu. Cornett, MD;  Location: [facility];  Service: General;  Laterality: Right;   NO PAST SURGERIES     TOTAL KNEE ARTHROPLASTY Right 11/24/2013   Procedure: RIGHT TOTAL KNEE ARTHROPLASTY;  Surgeon: Caitlyn Pal, MD;  Location: [facility];  Service: Orthopedics;  Laterality: Right;    Current Outpatient Medications  Medication Sig Dispense Refill   Cholecalciferol (VITAMIN D) 2000 UNITS CAPS Take 1 capsule by mouth  daily.      diltiazem (CARDIZEM CD) 120 MG 24 hr capsule Take 1 capsule (120 mg total) by mouth daily. 90 capsule 3   ferrous sulfate 325 (65 FE) MG tablet Take 1 tablet (325 mg total) by mouth 3 (three) times daily after meals.  3   methotrexate (RHEUMATREX) 2.5 MG tablet Take 10 mg by mouth 2 (two) times a week. Wednesdays and Thursdays     pantoprazole (PROTONIX) 40 MG tablet Take 1 tablet (40 mg total) by mouth daily. (Patient not taking: Reported on 10/08/2022) 30 tablet 0   No current facility-administered medications for this visit.   Allergies:  Penicillins and Penicillin g   Social History: The patient  reports that she has never smoked. She has never used smokeless tobacco. She reports that she does not drink alcohol and does not use drugs.   Family History: The patient's family history includes Bone cancer in her paternal grandfather; Diabetes in her brother, father, maternal grandfather, maternal grandmother, mother, and sister; Hypertension in her father and mother; Kidney disease in her mother; Ovarian cancer in her paternal aunt.   ROS:  Please see the history of present illness. Otherwise, complete review of systems is positive for none.  All other systems are reviewed and negative.   Physical Exam: VS:  BP 132/70   Pulse 79   Ht 5\' 4"  (1.626 m)   Wt 171 lb 3.2 oz (77.7 kg)   SpO2 97%   BMI 29.39 kg/m , BMI Body mass index is 29.39 kg/m.  Wt Readings from Last 3  Encounters:  10/08/22 171 lb 3.2 oz (77.7 kg)  09/18/22 171 lb 12.8 oz (77.9 kg)  08/01/22 170 lb 6.4 oz (77.3 kg)    General: Patient appears comfortable at rest. HEENT: Conjunctiva and lids normal, oropharynx clear with moist mucosa. Neck: Supple, no elevated JVP or carotid bruits, no thyromegaly. Lungs: Clear to auscultation, nonlabored breathing at rest. Cardiac: Regular rate and rhythm, no S3 or significant systolic murmur, no pericardial rub. Abdomen: Soft, nontender, no hepatomegaly, bowel sounds  present, no guarding or rebound. Extremities: No pitting edema, distal pulses 2+. Skin: Warm and dry. Musculoskeletal: No kyphosis. Neuropsychiatric: Alert and oriented x3, affect grossly appropriate.  ECG:  NSR  Recent Labwork: 05/18/2022: B Natriuretic Peptide 196.0 05/23/2022: Magnesium 2.0 06/14/2022: ALT 40; AST 35; BUN 15; Creatinine, Ser 0.74; Potassium 3.9; Sodium 140 09/18/2022: Hemoglobin 12.7; Platelets 151     Component Value Date/Time   CHOL 166 09/18/2022 1141   TRIG 53 09/18/2022 1141   HDL 64 09/18/2022 1141   CHOLHDL 2.6 09/18/2022 1141   LDLCALC 91 09/18/2022 1141    Other Studies Reviewed Today: Event monitor in 06/2022 No atrial or ventricular arrhythmias 1 brief run of asymptomatic SVT Unremarkable event monitor overall  Echocardiogram on 06/13/2022 Pericardial effusion is completely resolved compared to 05/2022 echocardiogram Normal LVEF  Assessment and Plan:  Patient is a 72 year old F known to have rheumatoid arthritis, HTN, DM 2, pericarditis with pericardial effusion with no evidence of tamponade, paroxysmal atrial flutter is here for follow-up visit.  # Acute pericarditis with pericardial effusion with no tamponade in 2/24: Patient completed course of high-dose aspirin therapy and currently on colchicine 0.6 mg twice daily which I instructed patient to stop taking it by the end of the month. Repeat echocardiogram upon hospital discharge showed complete resolution of pericardial effusion.   # Paroxysmal atrial flutter likely secondary to sepsis and acute pericarditis during 2/24 hospitalization: Brief atrial flutter episode that lasted for 1 hour and stabilized on IV diltiazem. Event monitor in 3/24 showed no evidence of atrial arrhythmias.  Continue p.o. diltiazem at 120 mg once daily.  Since has no recurrence of atrial arrhythmias on the event monitor from 3/24 and no palpitations in the interval, it is okay to stop rivaroxaban 20 mg.  Patient does not want to  undergo OSA testing.   I have spent a total of 30 minutes with patient reviewing chart, EKGs, labs and examining patient as well as establishing an assessment and plan that was discussed with the patient.  > 50% of time was spent in direct patient care.     Medication Adjustments/Labs and Tests Ordered: Current medicines are reviewed at length with the patient today.  Concerns regarding medicines are outlined above.   Tests Ordered: No orders of the defined types were placed in this encounter.   Medication Changes: No orders of the defined types were placed in this encounter.   Disposition:  Follow up  12 months  Signed, Meng Winterton Verne Spurr, MD, 10/08/2022 10:50 AM    [facility] [address], [place], [place] [address]

## 2022-10-08 NOTE — Patient Instructions (Signed)
Medication Instructions:  STOP colchicine by the end of July  STOP Xarelto  Labwork: None today  Testing/Procedures: None today  Follow-Up: 1 year  Any Other Special Instructions Will Be Listed Below (If Applicable).  If you need a refill on your cardiac medications before your next appointment, please call your pharmacy.

## 2022-10-12 ENCOUNTER — Ambulatory Visit: Payer: Medicare HMO | Admitting: Internal Medicine

## 2022-10-16 ENCOUNTER — Ambulatory Visit: Payer: Medicare HMO | Admitting: Internal Medicine

## 2022-10-31 ENCOUNTER — Ambulatory Visit: Payer: Medicare HMO

## 2022-10-31 VITALS — Ht 64.0 in | Wt 171.0 lb

## 2022-10-31 DIAGNOSIS — Z1231 Encounter for screening mammogram for malignant neoplasm of breast: Secondary | ICD-10-CM

## 2022-10-31 DIAGNOSIS — Z0000 Encounter for general adult medical examination without abnormal findings: Secondary | ICD-10-CM | POA: Diagnosis not present

## 2022-10-31 NOTE — Progress Notes (Signed)
Subjective:   Caitlyn Newman is a 72 y.o. female who presents for Medicare Annual (Subsequent) preventive examination.  Visit Complete: Virtual  I connected with  Caitlyn Newman on 10/31/22 by a audio enabled telemedicine application and verified that I am speaking with the correct person using two identifiers.  Patient Location: Home  Provider Location: Home Office  I discussed the limitations of evaluation and management by telemedicine. The patient expressed understanding and agreed to proceed.  Patient Medicare AWV questionnaire was completed by the patient on 10/31/2022; I have confirmed that all information answered by patient is correct and no changes since this date.  Review of Systems     Cardiac Risk Factors include: advanced age (>74men, >10 women);dyslipidemia;hypertensionPer patient no change in vitals since last visit; unable to obtain new vitals due to this being a telehealth visit. Patient was unable to self-report vital signs via telehealth due to a lack of equipment at home.      Objective:    Today's Vitals   10/31/22 1345  Weight: 171 lb (77.6 kg)  Height: 5\' 4"  (1.626 m)   Body mass index is 29.35 kg/m.     10/31/2022    1:49 PM 07/26/2022    6:06 PM 05/19/2022    6:59 AM 05/18/2022    7:54 AM 11/24/2013    4:35 PM 11/17/2013    1:22 PM 01/07/2013   11:08 AM  Advanced Directives  Does Patient Have a Medical Advance Directive? No No  No No Patient does not have advance directive;Patient would not like information Patient does not have advance directive  Would patient like information on creating a medical advance directive? No - Patient declined No - Patient declined Yes (Inpatient - patient defers creating a medical advance directive at this time - Information given)  No - patient declined information      Current Medications (verified) Outpatient Encounter Medications as of 10/31/2022  Medication Sig   Cholecalciferol (VITAMIN D) 2000 UNITS CAPS  Take 1 capsule by mouth daily.    diltiazem (CARDIZEM CD) 120 MG 24 hr capsule Take 1 capsule (120 mg total) by mouth daily.   ferrous sulfate 325 (65 FE) MG tablet Take 1 tablet (325 mg total) by mouth 3 (three) times daily after meals.   methotrexate (RHEUMATREX) 2.5 MG tablet Take 10 mg by mouth 2 (two) times a week. Wednesdays and Thursdays   pantoprazole (PROTONIX) 40 MG tablet Take 1 tablet (40 mg total) by mouth daily.   No facility-administered encounter medications on file as of 10/31/2022.    Allergies (verified) Penicillins and Penicillin g   History: Past Medical History:  Diagnosis Date   Anemia    Arthritis    Gout    Pneumonia    as a baby   Wears glasses    Past Surgical History:  Procedure Laterality Date   COLONOSCOPY W/ POLYPECTOMY     IR THORACENTESIS ASP PLEURAL SPACE W/IMG GUIDE  05/22/2022   LYMPH NODE BIOPSY Right 01/13/2013   Procedure: LYMPH NODE BIOPSY RIGHT GROIN;  Surgeon: Clovis Pu. Cornett, MD;  Location: [facility];  Service: General;  Laterality: Right;   NO PAST SURGERIES     TOTAL KNEE ARTHROPLASTY Right 11/24/2013   Procedure: RIGHT TOTAL KNEE ARTHROPLASTY;  Surgeon: Shelda Pal, MD;  Location: [facility];  Service: Orthopedics;  Laterality: Right;   Family History  Problem Relation Age of Onset   Diabetes Mother    Kidney disease Mother  Hypertension Mother    Hypertension Father    Diabetes Father    Diabetes Sister    Diabetes Brother    Ovarian cancer Paternal Aunt    Diabetes Maternal Grandmother    Diabetes Maternal Grandfather    Bone cancer Paternal Grandfather    Social History   Socioeconomic History   Marital status: Single    Spouse name: Not on file   Number of children: Not on file   Years of education: Not on file   Highest education level: Not on file  Occupational History   Not on file  Tobacco Use   Smoking status: Never   Smokeless tobacco: Never  Vaping Use   Vaping status: Never Used   Substance and Sexual Activity   Alcohol use: No   Drug use: No   Sexual activity: Not Currently  Other Topics Concern   Not on file  Social History Narrative   Not on file   Social Determinants of Health   Financial Resource Strain: Low Risk  (10/31/2022)   Overall Financial Resource Strain (CARDIA)    Difficulty of Paying Living Expenses: Not hard at all  Food Insecurity: No Food Insecurity (10/31/2022)   Hunger Vital Sign    Worried About Running Out of Food in the Last Year: Never true    Ran Out of Food in the Last Year: Never true  Transportation Needs: No Transportation Needs (10/31/2022)   PRAPARE - Administrator, Civil Service (Medical): No    Lack of Transportation (Non-Medical): No  Physical Activity: Inactive (10/31/2022)   Exercise Vital Sign    Days of Exercise per Week: 0 days    Minutes of Exercise per Session: 0 min  Stress: No Stress Concern Present (10/31/2022)   [employer] of Occupational Health - Occupational Stress Questionnaire    Feeling of Stress : Not at all  Social Connections: Moderately Isolated (10/31/2022)   Social Connection and Isolation Panel [NHANES]    Frequency of Communication with Friends and Family: More than three times a week    Frequency of Social Gatherings with Friends and Family: More than three times a week    Attends Religious Services: 1 to 4 times per year    Active Member of [employer] or Organizations: No    Attends Engineer, structural: Never    Marital Status: Never married    Tobacco Counseling Counseling given: Not Answered   Clinical Intake:  Pre-visit preparation completed: Yes  Pain : No/denies pain     Nutritional Risks: None Diabetes: No  How often do you need to have someone help you when you read instructions, pamphlets, or other written materials from your doctor or pharmacy?: 1 - Never  Interpreter Needed?: No  Information entered by :: Renie Ora, LPN   Activities of  Daily Living    10/31/2022    1:49 PM 05/18/2022    9:00 PM  In your present state of health, do you have any difficulty performing the following activities:  Hearing? 0 0  Vision? 0 0  Difficulty concentrating or making decisions? 0 0  Walking or climbing stairs? 0 0  Dressing or bathing? 0 0  Doing errands, shopping? 0 0  Preparing Food and eating ? N   Using the Toilet? N   In the past six months, have you accidently leaked urine? N   Do you have problems with loss of bowel control? N   Managing your Medications? N  Managing your Finances? N   Housekeeping or managing your Housekeeping? N     Patient Care Team: Sonny Masters, FNP as PCP - General (Family Medicine) Mallipeddi, Orion Modest, MD as PCP - Cardiology (Cardiology)  Indicate any recent Medical Services you may have received from other than [facility] providers in the past year (date may be approximate).     Assessment:   This is a routine wellness examination for Juanita.  Hearing/Vision screen Vision Screening - Comments:: Wears rx glasses - up to date with routine eye exams with  Dr.Johnson   Dietary issues and exercise activities discussed:     Goals Addressed             This Visit's Progress    Exercise 3x per week (30 min per time)         Depression Screen    10/31/2022    1:48 PM 09/18/2022   11:58 AM 06/14/2022    3:02 PM  PHQ 2/9 Scores  PHQ - 2 Score 0 0 2  PHQ- 9 Score 0 0 4    Fall Risk    10/31/2022    1:47 PM 09/18/2022   11:58 AM 06/14/2022    3:02 PM  Fall Risk   Falls in the past year? 0 0 0  Number falls in past yr: 0    Injury with Fall? 0    Risk for fall due to : No Fall Risks    Follow up Falls prevention discussed      MEDICARE RISK AT HOME:  Medicare Risk at Home - 10/31/22 1347     Any stairs in or around the home? No    If so, are there any without handrails? No    Home free of loose throw rugs in walkways, pet beds, electrical cords, etc? Yes    Adequate lighting in  your home to reduce risk of falls? Yes    Life alert? No    Use of a cane, walker or w/c? No    Grab bars in the bathroom? No    Shower chair or bench in shower? No    Elevated toilet seat or a handicapped toilet? No             TIMED UP AND GO:  Was the test performed?  No    Cognitive Function:        10/31/2022    1:49 PM  6CIT Screen  What Year? 0 points  What month? 0 points  What time? 0 points  Count back from 20 0 points  Months in reverse 0 points  Repeat phrase 0 points  Total Score 0 points    Immunizations Immunization History  Administered Date(s) Administered   Moderna Sars-Covid-2 Vaccination 07/09/2019, 08/12/2019    TDAP status: Due, Education has been provided regarding the importance of this vaccine. Advised may receive this vaccine at local pharmacy or Health Dept. Aware to provide a copy of the vaccination record if obtained from local pharmacy or Health Dept. Verbalized acceptance and understanding.  Flu Vaccine status: Declined, Education has been provided regarding the importance of this vaccine but patient still declined. Advised may receive this vaccine at local pharmacy or Health Dept. Aware to provide a copy of the vaccination record if obtained from local pharmacy or Health Dept. Verbalized acceptance and understanding.  Pneumococcal vaccine status: Declined,  Education has been provided regarding the importance of this vaccine but patient still declined. Advised may receive this vaccine  at local pharmacy or Health Dept. Aware to provide a copy of the vaccination record if obtained from local pharmacy or Health Dept. Verbalized acceptance and understanding.   Covid-19 vaccine status: Completed vaccines  Qualifies for Shingles Vaccine? Yes   Zostavax completed No   Shingrix Completed?: No.    Education has been provided regarding the importance of this vaccine. Patient has been advised to call insurance company to determine out of pocket  expense if they have not yet received this vaccine. Advised may also receive vaccine at local pharmacy or Health Dept. Verbalized acceptance and understanding.  Screening Tests Health Maintenance  Topic Date Due   FOOT EXAM  Never done   OPHTHALMOLOGY EXAM  Never done   Diabetic kidney evaluation - Urine ACR  Never done   DTaP/Tdap/Td (1 - Tdap) Never done   COVID-19 Vaccine (3 - Moderna risk series) 09/09/2019   Colonoscopy  02/20/2023   Zoster Vaccines- Shingrix (1 of 2) 12/19/2022 (Originally 02/25/1970)   Pneumonia Vaccine 1+ Years old (1 of 1 - PCV) 09/18/2023 (Originally 02/24/2018)   MAMMOGRAM  09/18/2023 (Originally 02/25/2003)   DEXA SCAN  09/18/2023 (Originally 02/24/2018)   Hepatitis C Screening  09/18/2023 (Originally 02/25/1969)   INFLUENZA VACCINE  11/08/2022   HEMOGLOBIN A1C  03/20/2023   Diabetic kidney evaluation - eGFR measurement  06/14/2023   Medicare Annual Wellness (AWV)  10/31/2023   HPV VACCINES  Aged Out    Health Maintenance  Health Maintenance Due  Topic Date Due   FOOT EXAM  Never done   OPHTHALMOLOGY EXAM  Never done   Diabetic kidney evaluation - Urine ACR  Never done   DTaP/Tdap/Td (1 - Tdap) Never done   COVID-19 Vaccine (3 - Moderna risk series) 09/09/2019   Colonoscopy  02/20/2023    Colorectal cancer screening: Type of screening: Colonoscopy. Completed 02/19/2013. Repeat every 10 years  Mammogram status: Ordered 10/31/2022. Pt provided with contact info and advised to call to schedule appt.   Bone Density status: Ordered declined . Pt provided with contact info and advised to call to schedule appt.  Lung Cancer Screening: (Low Dose CT Chest recommended if Age 66-80 years, 20 pack-year currently smoking OR have quit w/in 15years.) does not qualify.   Lung Cancer Screening Referral: n/a  Additional Screening:  Hepatitis C Screening: does qualify;   Vision Screening: Recommended annual ophthalmology exams for early detection of  glaucoma and other disorders of the eye. Is the patient up to date with their annual eye exam?  Yes  Who is the provider or what is the name of the office in which the patient attends annual eye exams? Dr.Johnson  If pt is not established with a provider, would they like to be referred to a provider to establish care? No .   Dental Screening: Recommended annual dental exams for proper oral hygiene    Community Resource Referral / Chronic Care Management: CRR required this visit?  No   CCM required this visit?  No     Plan:     I have personally reviewed and noted the following in the patient's chart:   Medical and social history Use of alcohol, tobacco or illicit drugs  Current medications and supplements including opioid prescriptions. Patient is not currently taking opioid prescriptions. Functional ability and status Nutritional status Physical activity Advanced directives List of other physicians Hospitalizations, surgeries, and ER visits in previous 12 months Vitals Screenings to include cognitive, depression, and falls Referrals and appointments  In addition,  I have reviewed and discussed with patient certain preventive protocols, quality metrics, and best practice recommendations. A written personalized care plan for preventive services as well as general preventive health recommendations were provided to patient.     Lorrene Reid, LPN   11/01/3662   After Visit Summary: (MyChart) Due to this being a telephonic visit, the after visit summary with patients personalized plan was offered to patient via MyChart   Nurse Notes: No vaccine on file patient declines

## 2022-10-31 NOTE — Patient Instructions (Signed)
Caitlyn Newman , Thank you for taking time to come for your Medicare Wellness Visit. I appreciate your ongoing commitment to your health goals. Please review the following plan we discussed and let me know if I can assist you in the future.   These are the goals we discussed:  Goals      Exercise 3x per week (30 min per time)        This is a list of the screening recommended for you and due dates:  Health Maintenance  Topic Date Due   Complete foot exam   Never done   Eye exam for diabetics  Never done   Yearly kidney health urinalysis for diabetes  Never done   DTaP/Tdap/Td vaccine (1 - Tdap) Never done   COVID-19 Vaccine (3 - Moderna risk series) 09/09/2019   Colon Cancer Screening  02/20/2023   Zoster (Shingles) Vaccine (1 of 2) 12/19/2022*   Pneumonia Vaccine (1 of 1 - PCV) 09/18/2023*   Mammogram  09/18/2023*   DEXA scan (bone density measurement)  09/18/2023*   Hepatitis C Screening  09/18/2023*   Flu Shot  11/08/2022   Hemoglobin A1C  03/20/2023   Yearly kidney function blood test for diabetes  06/14/2023   Medicare Annual Wellness Visit  10/31/2023   HPV Vaccine  Aged Out  *Topic was postponed. The date shown is not the original due date.    Advanced directives: Advance directive discussed with you today. I have provided a copy for you to complete at home and have notarized. Once this is complete please bring a copy in to our office so we can scan it into your chart. Information on Advanced Care Planning can be found at [facility] Advance Health Care Directives Advance Health Care Directives ([email or web address]    Conditions/risks identified: Aim for 30 minutes of exercise or brisk walking, 6-8 glasses of water, and 5 servings of fruits and vegetables each day.   Next appointment: Follow up in one year for your annual wellness visit    Preventive Care 65 Years and Older, Female Preventive care refers to lifestyle choices and visits with your health care  provider that can promote health and wellness. What does preventive care include? A yearly physical exam. This is also called an annual well check. Dental exams once or twice a year. Routine eye exams. Ask your health care provider how often you should have your eyes checked. Personal lifestyle choices, including: Daily care of your teeth and gums. Regular physical activity. Eating a healthy diet. Avoiding tobacco and drug use. Limiting alcohol use. Practicing safe sex. Taking low-dose aspirin every day. Taking vitamin and mineral supplements as recommended by your health care provider. What happens during an annual well check? The services and screenings done by your health care provider during your annual well check will depend on your age, overall health, lifestyle risk factors, and family history of disease. Counseling  Your health care provider may ask you questions about your: Alcohol use. Tobacco use. Drug use. Emotional well-being. Home and relationship well-being. Sexual activity. Eating habits. History of falls. Memory and ability to understand (cognition). Work and work Astronomer. Reproductive health. Screening  You may have the following tests or measurements: Height, weight, and BMI. Blood pressure. Lipid and cholesterol levels. These may be checked every 5 years, or more frequently if you are over 72 years old. Skin check. Lung cancer screening. You may have this screening every year starting at age 39 if you have  a 30-pack-year history of smoking and currently smoke or have quit within the past 15 years. Fecal occult blood test (FOBT) of the stool. You may have this test every year starting at age 66. Flexible sigmoidoscopy or colonoscopy. You may have a sigmoidoscopy every 5 years or a colonoscopy every 10 years starting at age 50. Hepatitis C blood test. Hepatitis B blood test. Sexually transmitted disease (STD) testing. Diabetes screening. This is done by  checking your blood sugar (glucose) after you have not eaten for a while (fasting). You may have this done every 1-3 years. Bone density scan. This is done to screen for osteoporosis. You may have this done starting at age 35. Mammogram. This may be done every 1-2 years. Talk to your health care provider about how often you should have regular mammograms. Talk with your health care provider about your test results, treatment options, and if necessary, the need for more tests. Vaccines  Your health care provider may recommend certain vaccines, such as: Influenza vaccine. This is recommended every year. Tetanus, diphtheria, and acellular pertussis (Tdap, Td) vaccine. You may need a Td booster every 10 years. Zoster vaccine. You may need this after age 76. Pneumococcal 13-valent conjugate (PCV13) vaccine. One dose is recommended after age 33. Pneumococcal polysaccharide (PPSV23) vaccine. One dose is recommended after age 72. Talk to your health care provider about which screenings and vaccines you need and how often you need them. This information is not intended to replace advice given to you by your health care provider. Make sure you discuss any questions you have with your health care provider. Document Released: 04/22/2015 Document Revised: 12/14/2015 Document Reviewed: 01/25/2015 Elsevier Interactive Patient Education  2017 [employer].  Fall Prevention in the Home Falls can cause injuries. They can happen to people of all ages. There are many things you can do to make your home safe and to help prevent falls. What can I do on the outside of my home? Regularly fix the edges of walkways and driveways and fix any cracks. Remove anything that might make you trip as you walk through a door, such as a raised step or threshold. Trim any bushes or trees on the path to your home. Use bright outdoor lighting. Clear any walking paths of anything that might make someone trip, such as rocks or  tools. Regularly check to see if handrails are loose or broken. Make sure that both sides of any steps have handrails. Any raised decks and porches should have guardrails on the edges. Have any leaves, snow, or ice cleared regularly. Use sand or salt on walking paths during winter. Clean up any spills in your garage right away. This includes oil or grease spills. What can I do in the bathroom? Use night lights. Install grab bars by the toilet and in the tub and shower. Do not use towel bars as grab bars. Use non-skid mats or decals in the tub or shower. If you need to sit down in the shower, use a plastic, non-slip stool. Keep the floor dry. Clean up any water that spills on the floor as soon as it happens. Remove soap buildup in the tub or shower regularly. Attach bath mats securely with double-sided non-slip rug tape. Do not have throw rugs and other things on the floor that can make you trip. What can I do in the bedroom? Use night lights. Make sure that you have a light by your bed that is easy to reach. Do not use any  sheets or blankets that are too big for your bed. They should not hang down onto the floor. Have a firm chair that has side arms. You can use this for support while you get dressed. Do not have throw rugs and other things on the floor that can make you trip. What can I do in the kitchen? Clean up any spills right away. Avoid walking on wet floors. Keep items that you use a lot in easy-to-reach places. If you need to reach something above you, use a strong step stool that has a grab bar. Keep electrical cords out of the way. Do not use floor polish or wax that makes floors slippery. If you must use wax, use non-skid floor wax. Do not have throw rugs and other things on the floor that can make you trip. What can I do with my stairs? Do not leave any items on the stairs. Make sure that there are handrails on both sides of the stairs and use them. Fix handrails that are  broken or loose. Make sure that handrails are as long as the stairways. Check any carpeting to make sure that it is firmly attached to the stairs. Fix any carpet that is loose or worn. Avoid having throw rugs at the top or bottom of the stairs. If you do have throw rugs, attach them to the floor with carpet tape. Make sure that you have a light switch at the top of the stairs and the bottom of the stairs. If you do not have them, ask someone to add them for you. What else can I do to help prevent falls? Wear shoes that: Do not have high heels. Have rubber bottoms. Are comfortable and fit you well. Are closed at the toe. Do not wear sandals. If you use a stepladder: Make sure that it is fully opened. Do not climb a closed stepladder. Make sure that both sides of the stepladder are locked into place. Ask someone to hold it for you, if possible. Clearly mark and make sure that you can see: Any grab bars or handrails. First and last steps. Where the edge of each step is. Use tools that help you move around (mobility aids) if they are needed. These include: Canes. Walkers. Scooters. Crutches. Turn on the lights when you go into a dark area. Replace any light bulbs as soon as they burn out. Set up your furniture so you have a clear path. Avoid moving your furniture around. If any of your floors are uneven, fix them. If there are any pets around you, be aware of where they are. Review your medicines with your doctor. Some medicines can make you feel dizzy. This can increase your chance of falling. Ask your doctor what other things that you can do to help prevent falls. This information is not intended to replace advice given to you by your health care provider. Make sure you discuss any questions you have with your health care provider. Document Released: 01/20/2009 Document Revised: 09/01/2015 Document Reviewed: 04/30/2014 Elsevier Interactive Patient Education  2017 [employer].

## 2022-11-07 ENCOUNTER — Other Ambulatory Visit: Payer: Self-pay | Admitting: Family Medicine

## 2022-11-07 ENCOUNTER — Ambulatory Visit
Admission: RE | Admit: 2022-11-07 | Discharge: 2022-11-07 | Disposition: A | Discharge status: Home / Self Care | Payer: Medicare HMO | Source: Ambulatory Visit | Attending: Family Medicine | Admitting: Family Medicine

## 2022-11-07 DIAGNOSIS — Z1231 Encounter for screening mammogram for malignant neoplasm of breast: Secondary | ICD-10-CM

## 2022-11-07 DIAGNOSIS — Z0000 Encounter for general adult medical examination without abnormal findings: Secondary | ICD-10-CM

## 2022-11-09 ENCOUNTER — Other Ambulatory Visit: Payer: Self-pay | Admitting: Family Medicine

## 2022-11-09 DIAGNOSIS — R928 Other abnormal and inconclusive findings on diagnostic imaging of breast: Secondary | ICD-10-CM

## 2022-11-27 ENCOUNTER — Other Ambulatory Visit: Payer: Self-pay | Admitting: Family Medicine

## 2022-11-27 DIAGNOSIS — M069 Rheumatoid arthritis, unspecified: Secondary | ICD-10-CM

## 2022-12-14 NOTE — Progress Notes (Signed)
Spoke to patient - she would like to have add img done at [facility] - orders have been sent to them and they will contact patient with appointment

## 2022-12-18 ENCOUNTER — Encounter: Payer: Self-pay | Admitting: Family Medicine

## 2022-12-18 ENCOUNTER — Ambulatory Visit (INDEPENDENT_AMBULATORY_CARE_PROVIDER_SITE_OTHER): Payer: Medicare HMO | Admitting: Family Medicine

## 2022-12-18 ENCOUNTER — Other Ambulatory Visit: Payer: Medicare HMO

## 2022-12-18 VITALS — BP 130/55 | HR 56 | Temp 97.0°F | Ht 64.0 in | Wt 173.0 lb

## 2022-12-18 DIAGNOSIS — D500 Iron deficiency anemia secondary to blood loss (chronic): Secondary | ICD-10-CM

## 2022-12-18 DIAGNOSIS — Z0000 Encounter for general adult medical examination without abnormal findings: Secondary | ICD-10-CM

## 2022-12-18 DIAGNOSIS — E559 Vitamin D deficiency, unspecified: Secondary | ICD-10-CM | POA: Diagnosis not present

## 2022-12-18 DIAGNOSIS — J301 Allergic rhinitis due to pollen: Secondary | ICD-10-CM

## 2022-12-18 DIAGNOSIS — R6889 Other general symptoms and signs: Secondary | ICD-10-CM | POA: Diagnosis not present

## 2022-12-18 DIAGNOSIS — M0579 Rheumatoid arthritis with rheumatoid factor of multiple sites without organ or systems involvement: Secondary | ICD-10-CM | POA: Diagnosis not present

## 2022-12-18 DIAGNOSIS — Z0001 Encounter for general adult medical examination with abnormal findings: Secondary | ICD-10-CM

## 2022-12-18 DIAGNOSIS — R7303 Prediabetes: Secondary | ICD-10-CM | POA: Diagnosis not present

## 2022-12-18 DIAGNOSIS — Z1211 Encounter for screening for malignant neoplasm of colon: Secondary | ICD-10-CM

## 2022-12-18 LAB — BAYER DCA HB A1C WAIVED: HB A1C (BAYER DCA - WAIVED): 5.2 % (ref 4.8–5.6)

## 2022-12-18 MED ORDER — LEVOCETIRIZINE DIHYDROCHLORIDE 5 MG PO TABS
5.0000 mg | ORAL_TABLET | Freq: Every evening | ORAL | 1 refills | Status: DC
Start: 2022-12-18 — End: 2023-06-11

## 2022-12-18 NOTE — Progress Notes (Addendum)
Complete physical exam  Patient: Caitlyn Newman   DOB: 1950/06/04   72 y.o. Female  MRN: [id number]  Subjective:    Chief Complaint  Patient presents with   Annual Exam    Caitlyn Newman is a 72 y.o. female who presents today for a complete physical exam. She reports consuming a general diet. The patient does not participate in regular exercise at present. She generally feels well. She reports sleeping well. She does not have additional problems to discuss today.  Pt does have a history of RA and is currently followed by rheumatology and on methotrexate. She is tolerating the medications well but still has joint stiffness and pain at times.   Most recent fall risk assessment:    12/18/2022   10:22 AM  Fall Risk   Falls in the past year? 0     Most recent depression screenings:    12/18/2022   10:22 AM 10/31/2022    1:48 PM  PHQ 2/9 Scores  PHQ - 2 Score 0 0  PHQ- 9 Score 0 0    Vision:Within last year and Dental: No current dental problems  Patient Active Problem List   Diagnosis Date Noted   Prediabetes 09/18/2022   Vitamin D deficiency 09/18/2022   Unspecified atrial flutter (HCC) 07/20/2022   Adrenal nodule (HCC) 06/14/2022   Liver nodule 06/14/2022   Iron deficiency anemia due to chronic blood loss 06/14/2022   Rheumatoid arthritis involving multiple sites with positive rheumatoid factor (HCC) 06/14/2022   Pericarditis associated with rheumatoid arthritis (HCC) 05/18/2022   Class 1 obesity 05/18/2022   S/P right TKA 11/24/2013   Past Medical History:  Diagnosis Date   Anemia    Arthritis    Gout    Pneumonia    as a baby   Wears glasses    Past Surgical History:  Procedure Laterality Date   COLONOSCOPY W/ POLYPECTOMY     IR THORACENTESIS ASP PLEURAL SPACE W/IMG GUIDE  05/22/2022   LYMPH NODE BIOPSY Right 01/13/2013   Procedure: LYMPH NODE BIOPSY RIGHT GROIN;  Surgeon: Maisie Fus A. Cornett, MD;  Location: [facility];  Service: General;   Laterality: Right;   NO PAST SURGERIES     TOTAL KNEE ARTHROPLASTY Right 11/24/2013   Procedure: RIGHT TOTAL KNEE ARTHROPLASTY;  Surgeon: Shelda Pal, MD;  Location: [facility];  Service: Orthopedics;  Laterality: Right;   Social History   Tobacco Use   Smoking status: Never   Smokeless tobacco: Never  Vaping Use   Vaping status: Never Used  Substance Use Topics   Alcohol use: No   Drug use: No   Social History   Socioeconomic History   Marital status: Single    Spouse name: Not on file   Number of children: Not on file   Years of education: Not on file   Highest education level: Not on file  Occupational History   Not on file  Tobacco Use   Smoking status: Never   Smokeless tobacco: Never  Vaping Use   Vaping status: Never Used  Substance and Sexual Activity   Alcohol use: No   Drug use: No   Sexual activity: Not Currently  Other Topics Concern   Not on file  Social History Narrative   Not on file   Social Determinants of Health   Financial Resource Strain: Low Risk  (10/31/2022)   Overall Financial Resource Strain (CARDIA)    Difficulty of Paying Living Expenses: Not hard at all  Food Insecurity: No Food Insecurity (10/31/2022)   Hunger Vital Sign    Worried About Running Out of Food in the Last Year: Never true    Ran Out of Food in the Last Year: Never true  Transportation Needs: No Transportation Needs (10/31/2022)   PRAPARE - Administrator, Civil Service (Medical): No    Lack of Transportation (Non-Medical): No  Physical Activity: Inactive (10/31/2022)   Exercise Vital Sign    Days of Exercise per Week: 0 days    Minutes of Exercise per Session: 0 min  Stress: No Stress Concern Present (10/31/2022)   [employer] of Occupational Health - Occupational Stress Questionnaire    Feeling of Stress : Not at all  Social Connections: Moderately Isolated (10/31/2022)   Social Connection and Isolation Panel [NHANES]    Frequency of Communication with  Friends and Family: More than three times a week    Frequency of Social Gatherings with Friends and Family: More than three times a week    Attends Religious Services: 1 to 4 times per year    Active Member of [employer] or Organizations: No    Attends Banker Meetings: Never    Marital Status: Never married  Intimate Partner Violence: Not At Risk (10/31/2022)   Humiliation, Afraid, Rape, and Kick questionnaire    Fear of Current or Ex-Partner: No    Emotionally Abused: No    Physically Abused: No    Sexually Abused: No   Family Status  Relation Name Status   Mother  Deceased   Father  Deceased   Sister 5 Alive   Pat Aunt  Deceased   MGM  Deceased   MGF  Deceased   PGM  Deceased   PGF  Deceased   Brother 8 Alive   Neg Hx  (Not Specified)  No partnership data on file   Family History  Problem Relation Age of Onset   Diabetes Mother    Kidney disease Mother    Hypertension Mother    Hypertension Father    Diabetes Father    Diabetes Sister    Ovarian cancer Paternal Aunt    Diabetes Maternal Grandmother    Diabetes Maternal Grandfather    Bone cancer Paternal Grandfather    Diabetes Brother    Breast cancer Neg Hx    Allergies  Allergen Reactions   Penicillins     Other reaction(s): Other Unknown   Penicillin G Rash      Patient Care Team: Sonny Masters, FNP as PCP - General (Family Medicine) Mallipeddi, Orion Modest, MD as PCP - Cardiology (Cardiology)   Outpatient Medications Prior to Visit  Medication Sig   Cholecalciferol (VITAMIN D) 2000 UNITS CAPS Take 1 capsule by mouth daily.    ferrous sulfate 325 (65 FE) MG tablet Take 1 tablet (325 mg total) by mouth 3 (three) times daily after meals.   methotrexate (RHEUMATREX) 2.5 MG tablet Take 10 mg by mouth 2 (two) times a week. Wednesdays and Thursdays   pantoprazole (PROTONIX) 40 MG tablet Take 1 tablet (40 mg total) by mouth daily.   diltiazem (CARDIZEM CD) 120 MG 24 hr capsule Take 1 capsule (120 mg  total) by mouth daily. (Patient not taking: Reported on 12/18/2022)   No facility-administered medications prior to visit.    Review of Systems  Constitutional:  Negative for chills, diaphoresis, fever, malaise/fatigue and weight loss.  HENT:  Positive for congestion. Negative for ear discharge, ear pain, hearing loss, nosebleeds,  sinus pain and tinnitus.   Respiratory:  Positive for cough. Negative for hemoptysis, sputum production, shortness of breath, wheezing and stridor.   Musculoskeletal:  Positive for back pain, joint pain and myalgias. Negative for falls and neck pain.  Skin:  Negative for itching and rash.  All other systems reviewed and are negative.         Objective:     BP (!) 130/55   Pulse (!) 56   Temp (!) 97 F (36.1 C) (Temporal)   Ht 5\' 4"  (1.626 m)   Wt 173 lb (78.5 kg)   SpO2 97%   BMI 29.70 kg/m  BP Readings from Last 3 Encounters:  12/18/22 (!) 130/55  10/08/22 132/70  09/18/22 (!) 127/59   Wt Readings from Last 3 Encounters:  12/18/22 173 lb (78.5 kg)  10/31/22 171 lb (77.6 kg)  10/08/22 171 lb 3.2 oz (77.7 kg)   SpO2 Readings from Last 3 Encounters:  12/18/22 97%  10/08/22 97%  09/18/22 95%      Physical Exam Vitals and nursing note reviewed.  Constitutional:      General: She is not in acute distress.    Appearance: Normal appearance. She is well-developed, well-groomed and overweight. She is not ill-appearing, toxic-appearing or diaphoretic.  HENT:     Head: Normocephalic and atraumatic.     Jaw: There is normal jaw occlusion.     Right Ear: Hearing, ear canal and external ear normal. A middle ear effusion is present. Tympanic membrane is not erythematous.     Left Ear: Hearing, ear canal and external ear normal. A middle ear effusion is present. Tympanic membrane is not erythematous.     Nose: Rhinorrhea present. Rhinorrhea is clear.     Mouth/Throat:     Lips: Pink.     Mouth: Mucous membranes are moist.     Pharynx: Oropharynx  is clear. Uvula midline. Postnasal drip present. No pharyngeal swelling, oropharyngeal exudate, posterior oropharyngeal erythema or uvula swelling.     Tonsils: No tonsillar exudate.     Comments: Cobblestoning  Eyes:     General: Lids are normal.     Extraocular Movements: Extraocular movements intact.     Conjunctiva/sclera: Conjunctivae normal.     Pupils: Pupils are equal, round, and reactive to light.  Neck:     Thyroid: No thyroid mass, thyromegaly or thyroid tenderness.     Vascular: No carotid bruit or JVD.     Trachea: Trachea and phonation normal.  Cardiovascular:     Rate and Rhythm: Normal rate and regular rhythm.     Chest Wall: PMI is not displaced.     Pulses: Normal pulses.     Heart sounds: Normal heart sounds. No murmur heard.    No friction rub. No gallop.  Pulmonary:     Effort: Pulmonary effort is normal. No respiratory distress.     Breath sounds: Normal breath sounds. No wheezing.  Abdominal:     General: Bowel sounds are normal. There is no distension or abdominal bruit.     Palpations: Abdomen is soft. There is no hepatomegaly or splenomegaly.     Tenderness: There is no abdominal tenderness. There is no right CVA tenderness or left CVA tenderness.     Hernia: No hernia is present.  Genitourinary:    Comments: Deferred  Musculoskeletal:        General: Normal range of motion.     Cervical back: Normal range of motion and neck supple.  Right lower leg: No edema.     Left lower leg: No edema.  Lymphadenopathy:     Cervical: No cervical adenopathy.  Skin:    General: Skin is warm and dry.     Capillary Refill: Capillary refill takes less than 2 seconds.     Coloration: Skin is not cyanotic, jaundiced or pale.     Findings: No rash.  Neurological:     General: No focal deficit present.     Mental Status: She is alert and oriented to person, place, and time.     Sensory: Sensation is intact.     Motor: Motor function is intact.     Coordination:  Coordination is intact.     Gait: Gait abnormal (antalgic, using cane).     Deep Tendon Reflexes: Reflexes are normal and symmetric.  Psychiatric:        Attention and Perception: Attention and perception normal.        Mood and Affect: Mood and affect normal.        Speech: Speech normal.        Behavior: Behavior normal. Behavior is cooperative.        Thought Content: Thought content normal.        Cognition and Memory: Cognition and memory normal.        Judgment: Judgment normal.      No results found for any visits on 12/18/22. Last CBC Lab Results  Component Value Date   WBC 7.7 09/18/2022   HGB 12.7 09/18/2022   HCT 38.3 09/18/2022   MCV 92 09/18/2022   MCH 30.4 09/18/2022   RDW 15.2 09/18/2022   PLT 151 09/18/2022   Last metabolic panel Lab Results  Component Value Date   GLUCOSE 89 06/14/2022   NA 140 06/14/2022   K 3.9 06/14/2022   CL 103 06/14/2022   CO2 20 06/14/2022   BUN 15 06/14/2022   CREATININE 0.74 06/14/2022   EGFR 88 06/14/2022   CALCIUM 9.2 06/14/2022   PHOS 3.3 05/23/2022   PROT 6.8 06/14/2022   ALBUMIN 3.8 (L) 06/14/2022   LABGLOB 3.0 06/14/2022   AGRATIO 1.3 06/14/2022   BILITOT 0.5 06/14/2022   ALKPHOS 88 06/14/2022   AST 35 06/14/2022   ALT 40 (H) 06/14/2022   ANIONGAP 8 05/23/2022   Last lipids Lab Results  Component Value Date   CHOL 166 09/18/2022   HDL 64 09/18/2022   LDLCALC 91 09/18/2022   TRIG 53 09/18/2022   CHOLHDL 2.6 09/18/2022   Last hemoglobin A1c Lab Results  Component Value Date   HGBA1C 5.7 (H) 09/18/2022   Last thyroid functions No results found for: "TSH", "T3TOTAL", "T4TOTAL", "THYROIDAB" Last vitamin D Lab Results  Component Value Date   VD25OH 33.8 09/18/2022   Last vitamin B12 and Folate Lab Results  Component Value Date   VITAMINB12 631 06/14/2022   FOLATE >20.0 06/14/2022        Assessment & Plan:    Routine Health Maintenance and Physical Exam  Immunization History  Administered  Date(s) Administered   Moderna Sars-Covid-2 Vaccination 07/09/2019, 08/12/2019    Health Maintenance  Topic Date Due   Diabetic kidney evaluation - Urine ACR  Never done   Colonoscopy  02/20/2023   OPHTHALMOLOGY EXAM  12/18/2022 (Originally [birth year]25/1961)   Zoster Vaccines- Shingrix (1 of 2) 12/19/2022 (Originally 02/25/1972)   COVID-19 Vaccine (3 - Moderna risk series) 01/03/2023 (Originally 09/09/2019)   INFLUENZA VACCINE  07/08/2023 (Originally 11/08/2022)   Pneumonia Vaccine 65+  Years old (1 of 1 - PCV) 09/18/2023 (Originally 02/24/2018)   DEXA SCAN  09/18/2023 (Originally 02/24/2018)   Hepatitis C Screening  09/18/2023 (Originally 02/25/1971)   HEMOGLOBIN A1C  03/20/2023   Diabetic kidney evaluation - eGFR measurement  06/14/2023   Medicare Annual Wellness (AWV)  11/07/2023   FOOT EXAM  12/18/2023   MAMMOGRAM  11/06/2024   HPV VACCINES  Aged Out   DTaP/Tdap/Td  Discontinued    Discussed health benefits of physical activity, and encouraged her to engage in regular exercise appropriate for her age and condition.  Problem List Items Addressed This Visit       Musculoskeletal and Integument   Rheumatoid arthritis involving multiple sites with positive rheumatoid factor (HCC) Continue to follow with rheumatology. No changes in medications.      Other   Iron deficiency anemia due to chronic blood loss   Relevant Orders   Anemia Profile B   Prediabetes   Relevant Orders   Bayer DCA Hb A1c Waived   Vitamin D deficiency   Relevant Orders   CMP14+EGFR   VITAMIN D 25 Hydroxy (Vit-D Deficiency, Fractures)   Other Visit Diagnoses     Annual physical exam    -  Primary   Relevant Orders   CMP14+EGFR   Lipid panel   Thyroid Panel With TSH   VITAMIN D 25 Hydroxy (Vit-D Deficiency, Fractures)   Bayer DCA Hb A1c Waived   Anemia Profile B   Bayer DCA Hb A1c Waived   Seasonal allergic rhinitis due to pollen       Relevant Medications   levocetirizine (XYZAL) 5 MG tablet    Screening for colorectal cancer       Relevant Orders   Ambulatory referral to Gastroenterology      Return in about 6 months (around 06/17/2023) for chronic follow up.     The above assessment and management plan was discussed with the patient. The patient verbalized understanding of and has agreed to the management plan. Patient is aware to call the clinic if they develop any new symptoms or if symptoms fail to improve or worsen. Patient is aware when to return to the clinic for a follow-up visit. Patient educated on when it is appropriate to go to the emergency department.   Kari Baars, FNP-C [facility] [address] [place], [place] [address] [phone]

## 2022-12-19 LAB — THYROID PANEL WITH TSH
Free Thyroxine Index: 1.8 (ref 1.2–4.9)
T3 Uptake Ratio: 23 % — ABNORMAL LOW (ref 24–39)
T4, Total: 7.7 ug/dL (ref 4.5–12.0)
TSH: 2.49 u[IU]/mL (ref 0.450–4.500)

## 2022-12-19 LAB — CMP14+EGFR
ALT: 18 IU/L (ref 0–32)
AST: 19 IU/L (ref 0–40)
Albumin: 3.8 g/dL — ABNORMAL LOW (ref 3.9–4.9)
Alkaline Phosphatase: 86 IU/L (ref 44–121)
BUN/Creatinine Ratio: 23 (ref 12–28)
BUN: 16 mg/dL (ref 8–27)
Bilirubin Total: 0.4 mg/dL (ref 0.0–1.2)
CO2: 26 mmol/L (ref 20–29)
Calcium: 9.1 mg/dL (ref 8.7–10.3)
Chloride: 104 mmol/L (ref 96–106)
Creatinine, Ser: 0.7 mg/dL (ref 0.57–1.00)
Globulin, Total: 2.3 g/dL (ref 1.5–4.5)
Glucose: 97 mg/dL (ref 70–99)
Potassium: 4.3 mmol/L (ref 3.5–5.2)
Sodium: 140 mmol/L (ref 134–144)
Total Protein: 6.1 g/dL (ref 6.0–8.5)
eGFR: 94 mL/min/{1.73_m2} (ref >59)

## 2022-12-19 LAB — ANEMIA PROFILE B
Basophils Absolute: 0.1 10*3/uL (ref 0.0–0.2)
Basos: 1 %
EOS (ABSOLUTE): 0.1 10*3/uL (ref 0.0–0.4)
Eos: 2 %
Ferritin: 677 ng/mL — ABNORMAL HIGH (ref 15–150)
Folate: >20 ng/mL (ref >3.0)
Hematocrit: 37.4 % (ref 34.0–46.6)
Hemoglobin: 12.3 g/dL (ref 11.1–15.9)
Immature Grans (Abs): 0 10*3/uL (ref 0.0–0.1)
Immature Granulocytes: 0 %
Iron Saturation: 22 % (ref 15–55)
Iron: 53 ug/dL (ref 27–139)
Lymphocytes Absolute: 1.6 10*3/uL (ref 0.7–3.1)
Lymphs: 23 %
MCH: 31.8 pg (ref 26.6–33.0)
MCHC: 32.9 g/dL (ref 31.5–35.7)
MCV: 97 fL (ref 79–97)
Monocytes Absolute: 0.3 10*3/uL (ref 0.1–0.9)
Monocytes: 5 %
Neutrophils Absolute: 4.9 10*3/uL (ref 1.4–7.0)
Neutrophils: 69 %
Platelets: 158 10*3/uL (ref 150–450)
RBC: 3.87 x10E6/uL (ref 3.77–5.28)
RDW: 13.6 % (ref 11.7–15.4)
Retic Ct Pct: 0.9 % (ref 0.6–2.6)
Total Iron Binding Capacity: 239 ug/dL — ABNORMAL LOW (ref 250–450)
UIBC: 186 ug/dL (ref 118–369)
Vitamin B-12: 523 pg/mL (ref 232–1245)
WBC: 7 10*3/uL (ref 3.4–10.8)

## 2022-12-19 LAB — LIPID PANEL
Chol/HDL Ratio: 2.6 ratio (ref 0.0–4.4)
Cholesterol, Total: 180 mg/dL (ref 100–199)
HDL: 68 mg/dL (ref >39)
LDL Chol Calc (NIH): 99 mg/dL (ref 0–99)
Triglycerides: 69 mg/dL (ref 0–149)
VLDL Cholesterol Cal: 13 mg/dL (ref 5–40)

## 2022-12-19 LAB — VITAMIN D 25 HYDROXY (VIT D DEFICIENCY, FRACTURES): Vit D, 25-Hydroxy: 33.9 ng/mL (ref 30.0–100.0)

## 2022-12-20 NOTE — Progress Notes (Signed)
Patient has appt sch at [facility]

## 2022-12-26 ENCOUNTER — Other Ambulatory Visit: Payer: Medicare HMO

## 2023-01-09 DIAGNOSIS — M1990 Unspecified osteoarthritis, unspecified site: Secondary | ICD-10-CM | POA: Diagnosis not present

## 2023-01-09 DIAGNOSIS — E669 Obesity, unspecified: Secondary | ICD-10-CM | POA: Diagnosis not present

## 2023-01-09 DIAGNOSIS — I3139 Other pericardial effusion (noninflammatory): Secondary | ICD-10-CM | POA: Diagnosis not present

## 2023-01-09 DIAGNOSIS — M0609 Rheumatoid arthritis without rheumatoid factor, multiple sites: Secondary | ICD-10-CM | POA: Diagnosis not present

## 2023-01-09 DIAGNOSIS — Z79899 Other long term (current) drug therapy: Secondary | ICD-10-CM | POA: Diagnosis not present

## 2023-01-11 ENCOUNTER — Encounter: Payer: Self-pay | Admitting: Family Medicine

## 2023-03-05 ENCOUNTER — Encounter: Payer: Self-pay | Admitting: Family Medicine

## 2023-05-03 DIAGNOSIS — M79641 Pain in right hand: Secondary | ICD-10-CM | POA: Diagnosis not present

## 2023-05-03 DIAGNOSIS — M25562 Pain in left knee: Secondary | ICD-10-CM | POA: Diagnosis not present

## 2023-05-03 DIAGNOSIS — Z79899 Other long term (current) drug therapy: Secondary | ICD-10-CM | POA: Diagnosis not present

## 2023-05-03 DIAGNOSIS — M25561 Pain in right knee: Secondary | ICD-10-CM | POA: Diagnosis not present

## 2023-05-03 DIAGNOSIS — M0609 Rheumatoid arthritis without rheumatoid factor, multiple sites: Secondary | ICD-10-CM | POA: Diagnosis not present

## 2023-05-03 DIAGNOSIS — M1990 Unspecified osteoarthritis, unspecified site: Secondary | ICD-10-CM | POA: Diagnosis not present

## 2023-05-03 DIAGNOSIS — E669 Obesity, unspecified: Secondary | ICD-10-CM | POA: Diagnosis not present

## 2023-05-03 DIAGNOSIS — M79642 Pain in left hand: Secondary | ICD-10-CM | POA: Diagnosis not present

## 2023-05-03 DIAGNOSIS — I3139 Other pericardial effusion (noninflammatory): Secondary | ICD-10-CM | POA: Diagnosis not present

## 2023-05-04 LAB — LAB REPORT - SCANNED: EGFR: 90

## 2023-05-07 ENCOUNTER — Encounter: Payer: Self-pay | Admitting: Family Medicine

## 2023-05-24 ENCOUNTER — Other Ambulatory Visit: Payer: Self-pay | Admitting: Medical

## 2023-06-11 ENCOUNTER — Other Ambulatory Visit: Payer: Self-pay | Admitting: Family Medicine

## 2023-06-11 DIAGNOSIS — J301 Allergic rhinitis due to pollen: Secondary | ICD-10-CM

## 2023-06-18 ENCOUNTER — Ambulatory Visit (INDEPENDENT_AMBULATORY_CARE_PROVIDER_SITE_OTHER): Payer: Medicare HMO | Admitting: Family Medicine

## 2023-06-18 ENCOUNTER — Encounter: Payer: Self-pay | Admitting: Family Medicine

## 2023-06-18 VITALS — BP 127/57 | HR 98 | Temp 96.9°F | Ht 64.0 in | Wt 188.2 lb

## 2023-06-18 DIAGNOSIS — I4892 Unspecified atrial flutter: Secondary | ICD-10-CM | POA: Diagnosis not present

## 2023-06-18 DIAGNOSIS — Z8679 Personal history of other diseases of the circulatory system: Secondary | ICD-10-CM | POA: Diagnosis not present

## 2023-06-18 DIAGNOSIS — K219 Gastro-esophageal reflux disease without esophagitis: Secondary | ICD-10-CM | POA: Diagnosis not present

## 2023-06-18 DIAGNOSIS — D500 Iron deficiency anemia secondary to blood loss (chronic): Secondary | ICD-10-CM | POA: Diagnosis not present

## 2023-06-18 DIAGNOSIS — R6889 Other general symptoms and signs: Secondary | ICD-10-CM | POA: Diagnosis not present

## 2023-06-18 DIAGNOSIS — E559 Vitamin D deficiency, unspecified: Secondary | ICD-10-CM | POA: Diagnosis not present

## 2023-06-18 DIAGNOSIS — R7303 Prediabetes: Secondary | ICD-10-CM

## 2023-06-18 DIAGNOSIS — M0579 Rheumatoid arthritis with rheumatoid factor of multiple sites without organ or systems involvement: Secondary | ICD-10-CM | POA: Diagnosis not present

## 2023-06-18 LAB — BAYER DCA HB A1C WAIVED: HB A1C (BAYER DCA - WAIVED): 5.8 % — ABNORMAL HIGH (ref 4.8–5.6)

## 2023-06-18 NOTE — Progress Notes (Signed)
 Subjective:  Patient ID: Caitlyn Newman, female    DOB: 11-30-1950, 73 y.o.   MRN: [id number]  Patient Care Team: Sonny Masters, FNP as PCP - General (Family Medicine) Marjo Bicker, MD as PCP - Cardiology (Cardiology)   Chief Complaint:  Medical Management of Chronic Issues   HPI: Caitlyn Newman is a 73 y.o. female presenting on 06/18/2023 for Medical Management of Chronic Issues   Discussed the use of AI scribe software for clinical note transcription with the patient, who gave verbal consent to proceed.  History of Present Illness   The patient presents for a routine checkup and follow-up on multiple medical conditions including prediabetes, vitamin D deficiency, pericarditis, rheumatoid arthritis, reflux, and anemia.  No worsening joint pain, chest pain, visual changes, or swelling in her legs or feet. She sleeps well and has regular bowel movements.  Her A1c was previously in the prediabetes range but was normal at the last check. She drinks a lot of water, which is typical for her, and has no increased hunger or urination.  She has a history of vitamin D deficiency and is currently taking vitamin D supplements.  She had pericarditis with atrial flutter, which has resolved. She completed treatment with prednisone, aspirin, and colchicine. No palpitations or shortness of breath. She is now on a yearly follow-up schedule with her cardiologist.  She has rheumatoid arthritis and is taking methotrexate. No changes in her regimen and her symptom management is 'pretty good'. She had x-rays of her hands and knees taken recently.  She has reflux and is taking pantoprazole (Protonix) daily.  She has anemia and takes iron supplements daily.          Relevant past medical, surgical, family, and social history reviewed and updated as indicated.  Allergies and medications reviewed and updated. Data reviewed: Chart in Epic.   Past Medical History:  Diagnosis Date    Anemia    Arthritis    Gout    Pneumonia    as a baby   Wears glasses     Past Surgical History:  Procedure Laterality Date   COLONOSCOPY W/ POLYPECTOMY     IR THORACENTESIS ASP PLEURAL SPACE W/IMG GUIDE  05/22/2022   LYMPH NODE BIOPSY Right 01/13/2013   Procedure: LYMPH NODE BIOPSY RIGHT GROIN;  Surgeon: Clovis Pu. Cornett, MD;  [facility];  Service: General;  Laterality: Right;   NO PAST SURGERIES     TOTAL KNEE ARTHROPLASTY Right 11/24/2013   Procedure: RIGHT TOTAL KNEE ARTHROPLASTY;  Surgeon: Shelda Pal, MD;  Location: [facility];  Service: Orthopedics;  Laterality: Right;    Social History   Socioeconomic History   Marital status: Single    Spouse name: Not on file   Number of children: Not on file   Years of education: Not on file   Highest education level: Not on file  Occupational History   Not on file  Tobacco Use   Smoking status: Never   Smokeless tobacco: Never  Vaping Use   Vaping status: Never Used  Substance and Sexual Activity   Alcohol use: No   Drug use: No   Sexual activity: Not Currently  Other Topics Concern   Not on file  Social History Narrative   Not on file   Social Drivers of Health   Financial Resource Strain: Low Risk  (10/31/2022)   Overall Financial Resource Strain (CARDIA)    Difficulty of Paying Living Expenses: Not hard at  all  Food Insecurity: No Food Insecurity (10/31/2022)   Hunger Vital Sign    Worried About Running Out of Food in the Last Year: Never true    Ran Out of Food in the Last Year: Never true  Transportation Needs: No Transportation Needs (10/31/2022)   PRAPARE - Administrator, Civil Service (Medical): No    Lack of Transportation (Non-Medical): No  Physical Activity: Inactive (10/31/2022)   Exercise Vital Sign    Days of Exercise per Week: 0 days    Minutes of Exercise per Session: 0 min  Stress: No Stress Concern Present (10/31/2022)   [employer] of Occupational Health -  Occupational Stress Questionnaire    Feeling of Stress : Not at all  Social Connections: Moderately Isolated (10/31/2022)   Social Connection and Isolation Panel [NHANES]    Frequency of Communication with Friends and Family: More than three times a week    Frequency of Social Gatherings with Friends and Family: More than three times a week    Attends Religious Services: 1 to 4 times per year    Active Member of [employer] or Organizations: No    Attends Banker Meetings: Never    Marital Status: Never married  Intimate Partner Violence: Not At Risk (10/31/2022)   Humiliation, Afraid, Rape, and Kick questionnaire    Fear of Current or Ex-Partner: No    Emotionally Abused: No    Physically Abused: No    Sexually Abused: No    Outpatient Encounter Medications as of 06/18/2023  Medication Sig   Cholecalciferol (VITAMIN D) 2000 UNITS CAPS Take 1 capsule by mouth daily.    diltiazem (CARDIZEM CD) 120 MG 24 hr capsule TAKE 1 CAPSULE BY MOUTH EVERY DAY   ferrous sulfate 325 (65 FE) MG tablet Take 1 tablet (325 mg total) by mouth 3 (three) times daily after meals.   levocetirizine (XYZAL) 5 MG tablet TAKE 1 TABLET BY MOUTH EVERY DAY IN THE EVENING   methotrexate (RHEUMATREX) 2.5 MG tablet Take 10 mg by mouth 2 (two) times a week. Wednesdays and Thursdays   pantoprazole (PROTONIX) 40 MG tablet Take 1 tablet (40 mg total) by mouth daily.   No facility-administered encounter medications on file as of 06/18/2023.    Allergies  Allergen Reactions   Penicillins     Other reaction(s): Other Unknown   Penicillin G Rash    Pertinent ROS per HPI, otherwise unremarkable      Objective:  BP (!) 127/57   Pulse 98   Temp (!) 96.9 F (36.1 C)   Ht 5\' 4"  (1.626 m)   Wt 188 lb 3.2 oz (85.4 kg)   SpO2 95%   BMI 32.30 kg/m    Wt Readings from Last 3 Encounters:  06/18/23 188 lb 3.2 oz (85.4 kg)  12/18/22 173 lb (78.5 kg)  10/31/22 171 lb (77.6 kg)    Physical Exam Vitals and  nursing note reviewed.  Constitutional:      General: She is not in acute distress.    Appearance: Normal appearance. She is obese. She is not ill-appearing, toxic-appearing or diaphoretic.  HENT:     Head: Normocephalic and atraumatic.     Nose: Nose normal.     Mouth/Throat:     Mouth: Mucous membranes are moist.  Eyes:     Conjunctiva/sclera: Conjunctivae normal.     Pupils: Pupils are equal, round, and reactive to light.  Cardiovascular:     Rate and Rhythm: Normal rate  and regular rhythm.     Heart sounds: Normal heart sounds.  Pulmonary:     Effort: Pulmonary effort is normal.     Breath sounds: Normal breath sounds.  Musculoskeletal:     Cervical back: Neck supple.     Right lower leg: No edema.     Left lower leg: No edema.  Skin:    General: Skin is warm and dry.     Capillary Refill: Capillary refill takes less than 2 seconds.  Neurological:     General: No focal deficit present.     Mental Status: She is alert and oriented to person, place, and time.     Gait: Gait abnormal (antalgic, using cane).  Psychiatric:        Mood and Affect: Mood normal.        Behavior: Behavior normal.        Thought Content: Thought content normal.        Judgment: Judgment normal.     Results for orders placed or performed in visit on 05/21/23  Lab report - scanned   Collection Time: 05/04/23 12:37 PM  Result Value Ref Range   EGFR 90.0        Pertinent labs & imaging results that were available during my care of the patient were reviewed by me and considered in my medical decision making.  Assessment & Plan:  Caitlyn Newman was seen today for medical management of chronic issues.  Diagnoses and all orders for this visit:  Prediabetes -     Thyroid Panel With TSH -     CMP14+EGFR -     Bayer DCA Hb A1c Waived -     Anemia Profile B  Vitamin D deficiency -     CMP14+EGFR -     VITAMIN D 25 Hydroxy (Vit-D Deficiency, Fractures)  Iron deficiency anemia due to chronic blood  loss -     Anemia Profile B  Rheumatoid arthritis involving multiple sites with positive rheumatoid factor (HCC) -     CMP14+EGFR -     VITAMIN D 25 Hydroxy (Vit-D Deficiency, Fractures)  Atrial flutter, paroxysmal (HCC) -     Anemia Profile B  History of pericarditis -     Anemia Profile B  Gastroesophageal reflux disease without esophagitis -     Anemia Profile B     Assessment and Plan    Prediabetes Previous A1c was in the prediabetes range but normalized on the last check. No symptoms of polyphagia, polydipsia, or polyuria beyond typical habits. - Recheck A1c level today  Anemia Currently taking iron supplements daily. - Check iron levels today  Pericarditis with atrial flutter Resolved after treatment with prednisone, aspirin, and colchicine. No recurrence of palpitations or dyspnea. Follow-up with cardiologist Dr. Si Raider is on a yearly basis, with the next appointment in June or July.  Rheumatoid arthritis On methotrexate with good symptom management. Recent x-rays of hands and knees were taken by the rheumatologist.  Gastroesophageal reflux disease (GERD) Taking pantoprazole (Protonix) for reflux.  Vitamin D deficiency Currently taking vitamin D supplements.  General Health Maintenance Reports being active with regular bowel movements and no sleep issues. - Encourage continued physical activity          Continue all other maintenance medications.  Follow up plan: Return in about 6 months (around 12/19/2023) for Annual Physical.   Continue healthy lifestyle choices, including diet (rich in fruits, vegetables, and lean proteins, and low in salt and simple carbohydrates) and exercise (at  least 30 minutes of moderate physical activity daily).  Educational handout given for prediabetes   The above assessment and management plan was discussed with the patient. The patient verbalized understanding of and has agreed to the management plan. Patient is  aware to call the clinic if they develop any new symptoms or if symptoms persist or worsen. Patient is aware when to return to the clinic for a follow-up visit. Patient educated on when it is appropriate to go to the emergency department.   Kari Baars, FNP-C [facility] [phone]

## 2023-06-19 LAB — ANEMIA PROFILE B
Basophils Absolute: 0 10*3/uL (ref 0.0–0.2)
Basos: 1 %
EOS (ABSOLUTE): 0.1 10*3/uL (ref 0.0–0.4)
Eos: 2 %
Ferritin: 615 ng/mL — ABNORMAL HIGH (ref 15–150)
Folate: >20 ng/mL (ref >3.0)
Hematocrit: 38.4 % (ref 34.0–46.6)
Hemoglobin: 12.9 g/dL (ref 11.1–15.9)
Immature Grans (Abs): 0 10*3/uL (ref 0.0–0.1)
Immature Granulocytes: 0 %
Iron Saturation: 20 % (ref 15–55)
Iron: 44 ug/dL (ref 27–139)
Lymphocytes Absolute: 1.8 10*3/uL (ref 0.7–3.1)
Lymphs: 24 %
MCH: 31.5 pg (ref 26.6–33.0)
MCHC: 33.6 g/dL (ref 31.5–35.7)
MCV: 94 fL (ref 79–97)
Monocytes Absolute: 0.4 10*3/uL (ref 0.1–0.9)
Monocytes: 5 %
Neutrophils Absolute: 5.1 10*3/uL (ref 1.4–7.0)
Neutrophils: 68 %
Platelets: 149 10*3/uL — ABNORMAL LOW (ref 150–450)
RBC: 4.09 x10E6/uL (ref 3.77–5.28)
RDW: 14.5 % (ref 11.7–15.4)
Retic Ct Pct: 1 % (ref 0.6–2.6)
Total Iron Binding Capacity: 220 ug/dL — ABNORMAL LOW (ref 250–450)
UIBC: 176 ug/dL (ref 118–369)
Vitamin B-12: 525 pg/mL (ref 232–1245)
WBC: 7.4 10*3/uL (ref 3.4–10.8)

## 2023-06-19 LAB — THYROID PANEL WITH TSH
Free Thyroxine Index: 2 (ref 1.2–4.9)
T3 Uptake Ratio: 24 % (ref 24–39)
T4, Total: 8.2 ug/dL (ref 4.5–12.0)
TSH: 1.87 u[IU]/mL (ref 0.450–4.500)

## 2023-06-19 LAB — CMP14+EGFR
ALT: 14 IU/L (ref 0–32)
AST: 19 IU/L (ref 0–40)
Albumin: 3.8 g/dL — ABNORMAL LOW (ref 3.9–4.9)
Alkaline Phosphatase: 83 IU/L (ref 44–121)
BUN/Creatinine Ratio: 23 (ref 12–28)
BUN: 17 mg/dL (ref 8–27)
Bilirubin Total: 0.3 mg/dL (ref 0.0–1.2)
CO2: 26 mmol/L (ref 20–29)
Calcium: 8.9 mg/dL (ref 8.7–10.3)
Chloride: 104 mmol/L (ref 96–106)
Creatinine, Ser: 0.73 mg/dL (ref 0.57–1.00)
Globulin, Total: 2.5 g/dL (ref 1.5–4.5)
Glucose: 107 mg/dL — ABNORMAL HIGH (ref 70–99)
Potassium: 4.4 mmol/L (ref 3.5–5.2)
Sodium: 140 mmol/L (ref 134–144)
Total Protein: 6.3 g/dL (ref 6.0–8.5)
eGFR: 88 mL/min/{1.73_m2} (ref >59)

## 2023-06-19 LAB — VITAMIN D 25 HYDROXY (VIT D DEFICIENCY, FRACTURES): Vit D, 25-Hydroxy: 35.1 ng/mL (ref 30.0–100.0)

## 2023-08-19 DIAGNOSIS — I3139 Other pericardial effusion (noninflammatory): Secondary | ICD-10-CM | POA: Diagnosis not present

## 2023-08-19 DIAGNOSIS — E669 Obesity, unspecified: Secondary | ICD-10-CM | POA: Diagnosis not present

## 2023-08-19 DIAGNOSIS — M0609 Rheumatoid arthritis without rheumatoid factor, multiple sites: Secondary | ICD-10-CM | POA: Diagnosis not present

## 2023-08-19 DIAGNOSIS — Z79899 Other long term (current) drug therapy: Secondary | ICD-10-CM | POA: Diagnosis not present

## 2023-08-19 DIAGNOSIS — M1990 Unspecified osteoarthritis, unspecified site: Secondary | ICD-10-CM | POA: Diagnosis not present

## 2023-08-20 LAB — LAB REPORT - SCANNED: EGFR (African American): 90

## 2023-09-10 ENCOUNTER — Ambulatory Visit: Payer: Self-pay

## 2023-09-10 NOTE — Telephone Encounter (Signed)
 Copied from CRM [id number]. Topic: Clinical - Red Word Triage >> Sep 10, 2023  5:00 PM [name] wrote: Patient called to advise that she's been experiencing ringing in her ear. She has a recent history of pneumonia and is coughing up yellow phlegm. Please assist.   Chief Complaint: Ear pain Symptoms: Right ear pain, ringing in ear, cough Frequency: Constant  Pertinent Negatives: Patient denies fever  Disposition: [] ED /[] Urgent Care (no appt availability in office) / [x] Appointment(In office/virtual)/ []  [facility]/ [] Home Care/ [] Refused Recommended Disposition /[] [facility]/ []  Follow-up with PCP Additional Notes: Patient reports mild right ear pain with ringing in that ear for the last 2 weeks. Patient is also experiencing a cough productive of yellow sputum. She denies any fevers. Appointment made for tomorrow for evaluation.     Reason for Disposition  Earache  (Exceptions: brief ear pain of < 60 minutes duration, earache occurring during air travel  Answer Assessment - Initial Assessment Questions 1. LOCATION: "Which ear is involved?"     Right ear  2. ONSET: "When did the ear start hurting"      2 weeks ago  3. SEVERITY: "How bad is the pain?"  (Scale 1-10; mild, moderate or severe)   - MILD (1-3): doesn't interfere with normal activities    - MODERATE (4-7): interferes with normal activities or awakens from sleep    - SEVERE (8-10): excruciating pain, unable to do any normal activities      Mild 4. URI SYMPTOMS: "Do you have a runny nose or cough?"     Cough with yellow sputum  5. FEVER: "Do you have a fever?" If Yes, ask: "What is your temperature, how was it measured, and when did it start?"     No 6. CAUSE: "Have you been swimming recently?", "How often do you use Q-TIPS?", "Have you had any recent air travel or scuba diving?"     Unsure  7. OTHER SYMPTOMS: "Do you have any other symptoms?" (e.g., headache, stiff neck, dizziness, vomiting, runny nose,  decreased hearing)     Ringing in ears  Protocols used: Earache-A-AH

## 2023-09-11 ENCOUNTER — Encounter: Payer: Self-pay | Admitting: Family Medicine

## 2023-09-11 ENCOUNTER — Ambulatory Visit (INDEPENDENT_AMBULATORY_CARE_PROVIDER_SITE_OTHER): Admitting: Family Medicine

## 2023-09-11 VITALS — BP 155/72 | HR 56 | Temp 97.5°F | Ht 64.0 in | Wt 191.4 lb

## 2023-09-11 DIAGNOSIS — H6123 Impacted cerumen, bilateral: Secondary | ICD-10-CM | POA: Diagnosis not present

## 2023-09-11 DIAGNOSIS — H9313 Tinnitus, bilateral: Secondary | ICD-10-CM | POA: Diagnosis not present

## 2023-09-11 NOTE — Progress Notes (Signed)
 Subjective:  Patient ID: Caitlyn Newman, female    DOB: Feb 17, 1951, 73 y.o.   MRN: [id number]  Patient Care Team: [name], FNP as PCP - General (Family Medicine) Mallipeddi, Kennyth Pean, MD as PCP - Cardiology (Cardiology)   Chief Complaint:  Tinnitus (Ringing in left ear- states it has been going on awhile but has gotten worse.)   HPI: Caitlyn Newman is a 73 y.o. female presenting on 09/11/2023 for Tinnitus (Ringing in left ear- states it has been going on awhile but has gotten worse.)  Caitlyn Newman is a 73 year old female who presents with worsening ringing in her left ear.  She has been experiencing tinnitus in her left ear, which has recently intensified. Previously, she had bilateral tinnitus, but the current intensity in the left ear is more pronounced and is accompanied by slight otalgia.  No dizziness, headaches, weakness, confusion, nasal congestion, or rhinorrhea. She notes spitting but does not associate it with any other symptoms.        Relevant past medical, surgical, family, and social history reviewed and updated as indicated.  Allergies and medications reviewed and updated. Data reviewed: Chart in Epic.   Past Medical History:  Diagnosis Date   Anemia    Arthritis    Gout    Pneumonia    as a baby   Wears glasses     Past Surgical History:  Procedure Laterality Date   COLONOSCOPY W/ POLYPECTOMY     IR THORACENTESIS ASP PLEURAL SPACE W/IMG GUIDE  05/22/2022   LYMPH NODE BIOPSY Right 01/13/2013   Procedure: LYMPH NODE BIOPSY RIGHT GROIN;  Surgeon: Brandy Cal. 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   Procedure: RIGHT TOTAL KNEE ARTHROPLASTY;  Surgeon: Bevin Bucks, MD;  Location: [facility];  Service: Orthopedics;  Laterality: Right;    Social History   Socioeconomic History   Marital status: Single    Spouse name: Not on file    Number of children: Not on file   Years of education: Not on file   Highest education level: Not on file  Occupational History   Not on file  Tobacco Use   Smoking status: Never   Smokeless tobacco: Never  Vaping Use   Vaping status: Never Used  Substance and Sexual Activity   Alcohol use: No   Drug use: No   Sexual activity: Not Currently  Other Topics Concern   Not on file  Social History Narrative   Not on file   Social Drivers of Health   Financial Resource Strain: Low Risk  (10/31/2022)   Overall Financial Resource Strain (CARDIA)    Difficulty of Paying Living Expenses: Not hard at all  Food Insecurity: No Food Insecurity (10/31/2022)   Hunger Vital Sign    Worried About Running Out of Food in the Last Year: Never true    Ran Out of Food in the Last Year: Never true  Transportation Needs: No Transportation Needs (10/31/2022)   PRAPARE - Administrator, Civil Service (Medical): No    Lack of Transportation (Non-Medical): No  Physical Activity: Inactive (10/31/2022)   Exercise Vital Sign    Days of Exercise per Week: 0 days    Minutes of Exercise per Session: 0 min  Stress: No Stress Concern Present (10/31/2022)   [employer] of Occupational Health - Occupational Stress  Questionnaire    Feeling of Stress : Not at all  Social Connections: Moderately Isolated (10/31/2022)   Social Connection and Isolation Panel [NHANES]    Frequency of Communication with Friends and Family: More than three times a week    Frequency of Social Gatherings with Friends and Family: More than three times a week    Attends Religious Services: 1 to 4 times per year    Active Member of [employer] or Organizations: No    Attends Banker Meetings: Never    Marital Status: Never married  Intimate Partner Violence: Not At Risk (10/31/2022)   Humiliation, Afraid, Rape, and Kick questionnaire    Fear of Current or Ex-Partner: No    Emotionally Abused: No    Physically Abused:  No    Sexually Abused: No    Outpatient Encounter Medications as of 09/11/2023  Medication Sig   Cholecalciferol  (VITAMIN D ) 2000 UNITS CAPS Take 1 capsule by mouth daily.    diltiazem  (CARDIZEM  CD) 120 MG 24 hr capsule TAKE 1 CAPSULE BY MOUTH EVERY DAY   ferrous sulfate  325 (65 FE) MG tablet Take 1 tablet (325 mg total) by mouth 3 (three) times daily after meals.   levocetirizine (XYZAL ) 5 MG tablet TAKE 1 TABLET BY MOUTH EVERY DAY IN THE EVENING   methotrexate (RHEUMATREX) 2.5 MG tablet Take 10 mg by mouth 2 (two) times a week. Wednesdays and Thursdays   pantoprazole  (PROTONIX ) 40 MG tablet Take 1 tablet (40 mg total) by mouth daily.   No facility-administered encounter medications on file as of 09/11/2023.    Allergies  Allergen Reactions   Penicillins     Other reaction(s): Other Unknown   Hydrocortisone Rash and Dermatitis   Penicillin G Rash and Dermatitis    Pertinent ROS per HPI, otherwise unremarkable      Objective:  BP (!) 155/72   Pulse (!) 56   Temp (!) 97.5 F (36.4 C)   Ht 5\' 4"  (1.626 m)   Wt 191 lb 6.4 oz (86.8 kg)   SpO2 98%   BMI 32.85 kg/m    Wt Readings from Last 3 Encounters:  09/11/23 191 lb 6.4 oz (86.8 kg)  06/18/23 188 lb 3.2 oz (85.4 kg)  12/18/22 173 lb (78.5 kg)    Physical Exam Vitals and nursing note reviewed.  Constitutional:      Appearance: Normal appearance.  HENT:     Head: Normocephalic and atraumatic.     Right Ear: There is no impacted cerumen.     Left Ear: There is impacted cerumen.     Nose: Nose normal.     Mouth/Throat:     Mouth: Mucous membranes are moist.  Eyes:     Conjunctiva/sclera: Conjunctivae normal.  Cardiovascular:     Rate and Rhythm: Normal rate and regular rhythm.     Heart sounds: Normal heart sounds.  Pulmonary:     Effort: Pulmonary effort is normal.     Breath sounds: Normal breath sounds.  Musculoskeletal:     Cervical back: Neck supple.  Lymphadenopathy:     Cervical: No cervical  adenopathy.  Skin:    General: Skin is warm and dry.     Capillary Refill: Capillary refill takes less than 2 seconds.  Neurological:     General: No focal deficit present.     Mental Status: She is alert.     Gait: Gait abnormal (using cane).  Psychiatric:        Mood and Affect:  Mood normal.        Behavior: Behavior normal.        Thought Content: Thought content normal.        Judgment: Judgment normal.   Ear Cerumen Removal  Date/Time: 09/11/2023 11:45 AM  Performed by: [name], FNP Authorized by: [name], FNP   Anesthesia: Local Anesthetic: none Location details: right ear Patient tolerance: patient tolerated the procedure well with no immediate complications Procedure type: irrigation  Sedation: Patient sedated: no    Ear Cerumen Removal  Date/Time: 09/11/2023 11:45 AM  Performed by: [name], FNP Authorized by: [name], FNP   Anesthesia: Local Anesthetic: none Location details: left ear Patient tolerance: patient tolerated the procedure well with no immediate complications Procedure type: curette  Sedation: Patient sedated: no    Ear Cerumen Removal  Date/Time: 09/11/2023 11:45 AM  Performed by: [name], FNP Authorized by: [name], FNP   Anesthesia: Local Anesthetic: none Location details: left ear Patient tolerance: patient tolerated the procedure well with no immediate complications Procedure type: irrigation  Sedation: Patient sedated: no      Physical Exam   HEENT: Cerumen impaction removed from ear canals bilaterally, tympanic membranes normal.        Results for orders placed or performed in visit on 06/18/23  Bayer DCA Hb A1c Waived   Collection Time: 06/18/23 11:27 AM  Result Value Ref Range   HB A1C (BAYER DCA - WAIVED) 5.8 (H) 4.8 - 5.6 %  Thyroid  Panel With TSH   Collection Time: 06/18/23 11:28 AM  Result Value Ref Range   TSH 1.870 0.450 - 4.500 uIU/mL   T4, Total 8.2 4.5 - 12.0 ug/dL    T3 Uptake Ratio 24 24 - 39 %   Free Thyroxine Index 2.0 1.2 - 4.9  CMP14+EGFR   Collection Time: 06/18/23 11:28 AM  Result Value Ref Range   Glucose 107 (H) 70 - 99 mg/dL   BUN 17 8 - 27 mg/dL   Creatinine, Ser 3.24 0.57 - 1.00 mg/dL   eGFR 88 >40 NU/UVO/5.36   BUN/Creatinine Ratio 23 12 - 28   Sodium 140 134 - 144 mmol/L   Potassium 4.4 3.5 - 5.2 mmol/L   Chloride 104 96 - 106 mmol/L   CO2 26 20 - 29 mmol/L   Calcium 8.9 8.7 - 10.3 mg/dL   Total Protein 6.3 6.0 - 8.5 g/dL   Albumin 3.8 (L) 3.9 - 4.9 g/dL   Globulin, Total 2.5 1.5 - 4.5 g/dL   Bilirubin Total 0.3 0.0 - 1.2 mg/dL   Alkaline Phosphatase 83 44 - 121 IU/L   AST 19 0 - 40 IU/L   ALT 14 0 - 32 IU/L  VITAMIN D  25 Hydroxy (Vit-D Deficiency, Fractures)   Collection Time: 06/18/23 11:28 AM  Result Value Ref Range   Vit D, 25-Hydroxy 35.1 30.0 - 100.0 ng/mL  Anemia Profile B   Collection Time: 06/18/23 11:28 AM  Result Value Ref Range   Total Iron Binding Capacity 220 (L) 250 - 450 ug/dL   UIBC 644 034 - 742 ug/dL   Iron 44 27 - 595 ug/dL   Iron Saturation 20 15 - 55 %   Ferritin 615 (H) 15 - 150 ng/mL   Vitamin B-12 525 232 - 1,245 pg/mL   Folate >20.0 >3.0 ng/mL   WBC 7.4 3.4 - 10.8 x10E3/uL   RBC 4.09 3.77 - 5.28 x10E6/uL   Hemoglobin 12.9 11.1 - 15.9  g/dL   Hematocrit 16.1 09.6 - 46.6 %   MCV 94 79 - 97 fL   MCH 31.5 26.6 - 33.0 pg   MCHC 33.6 31.5 - 35.7 g/dL   RDW 04.5 40.9 - 81.1 %   Platelets 149 (L) 150 - 450 x10E3/uL   Neutrophils 68 Not Estab. %   Lymphs 24 Not Estab. %   Monocytes 5 Not Estab. %   Eos 2 Not Estab. %   Basos 1 Not Estab. %   Neutrophils Absolute 5.1 1.4 - 7.0 x10E3/uL   Lymphocytes Absolute 1.8 0.7 - 3.1 x10E3/uL   Monocytes Absolute 0.4 0.1 - 0.9 x10E3/uL   EOS (ABSOLUTE) 0.1 0.0 - 0.4 x10E3/uL   Basophils Absolute 0.0 0.0 - 0.2 x10E3/uL   Immature Granulocytes 0 Not Estab. %   Immature Grans (Abs) 0.0 0.0 - 0.1 x10E3/uL   Retic Ct Pct 1.0 0.6 - 2.6 %  Lab report -  scanned   Collection Time: 08/19/23 12:00 AM  Result Value Ref Range   EGFR (African American) 90        Pertinent labs & imaging results that were available during my care of the patient were reviewed by me and considered in my medical decision making.  Assessment & Plan:  Caitlyn Newman was seen today for tinnitus.  Diagnoses and all orders for this visit:  Bilateral impacted cerumen -     Ear Cerumen Removal -     Ear Cerumen Removal -     Ear Cerumen Removal  Tinnitus of both ears Please report continued symptoms.       Cerumen impaction Cerumen impaction in the left ear causing worsening tinnitus. 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 No associated dizziness, headaches, weakness, or confusion. Examination revealed significant cerumen buildup in the ear canal, which was removed. The tympanic membranes appear healthy post-cleaning. Advised that the ear canal may feel temporarily uncomfortable due to the cleaning process. Informed that using cotton swabs can exacerbate cerumen impaction. - Remove cerumen to alleviate impaction - Advise against using cotton swabs to prevent further impaction - Recommend over-the-counter carbamide peroxide (Debrox) drops to soften cerumen and prevent future impaction - Provide Debrox drops if available; otherwise, instruct to purchase from a pharmacy           Continue all other maintenance medications.  Follow up plan: Return if symptoms worsen or fail to improve.   Continue healthy lifestyle choices, including diet (rich in fruits, vegetables, and lean proteins, and low in salt and simple carbohydrates) and exercise (at least 30 minutes of moderate physical activity daily).  Educational handout given for earwax buildup   The above assessment and management plan was discussed with the patient. The patient verbalized understanding of and has agreed to the management plan. Patient is aware to call the  clinic if they develop any new symptoms or if symptoms persist or worsen. Patient is aware when to return to the clinic for a follow-up visit. Patient educated on when it is appropriate to go to the emergency department.   Kattie Parrot, FNP-C [facility] [phone]

## 2023-09-19 ENCOUNTER — Ambulatory Visit: Payer: Self-pay

## 2023-09-19 ENCOUNTER — Encounter: Payer: Self-pay | Admitting: Family Medicine

## 2023-09-19 ENCOUNTER — Ambulatory Visit (INDEPENDENT_AMBULATORY_CARE_PROVIDER_SITE_OTHER): Admitting: Family Medicine

## 2023-09-19 VITALS — BP 137/71 | HR 87 | Temp 97.8°F | Ht 64.0 in | Wt 189.6 lb

## 2023-09-19 DIAGNOSIS — R051 Acute cough: Secondary | ICD-10-CM | POA: Diagnosis not present

## 2023-09-19 DIAGNOSIS — J069 Acute upper respiratory infection, unspecified: Secondary | ICD-10-CM

## 2023-09-19 MED ORDER — FLUTICASONE PROPIONATE 50 MCG/ACT NA SUSP: 2.0000 | Freq: Every day | NASAL | 6 refills | Status: DC

## 2023-09-19 NOTE — Telephone Encounter (Signed)
 FYI Only or Action Required?: FYI only for provider  Patient was last seen in primary care on 09/11/2023 by [name], FNP. Called Nurse Triage reporting Cough. Symptoms began several days ago. Interventions attempted: Rest, hydration, or home remedies. Symptoms are: gradually worsening.  Triage Disposition: Home Care  Patient/caregiver understands and will follow disposition?: No, refuses disposition- requesting to be scheduled d/t history of pneumonia (per patient).          Copied from CRM [id number]. Topic: Clinical - Red Word Triage >> Sep 19, 2023  9:58 AM [name] wrote: Patient called to advise that her head cold is persisting. Patient was seen on 09/11/23 for ear irrigation. This has not helped. Patient has a history of pneumonia. Please assist.  Reason for Disposition  Cough with cold symptoms (e.g., runny nose, postnasal drip, throat clearing)  Answer Assessment - Initial Assessment Questions 1. ONSET: When did the cough begin?      Started coughing 2 days ago  2. SEVERITY: How bad is the cough today?      Getting worse at night  3. SPUTUM: Describe the color of your sputum (none, dry cough; clear, white, yellow, green)     White mucus  4. HEMOPTYSIS: Are you coughing up any blood? If so ask: How much? (flecks, streaks, tablespoons, etc.)     No  5. DIFFICULTY BREATHING: Are you having difficulty breathing? If Yes, ask: How bad is it? (e.g., mild, moderate, severe)    - MILD: No SOB at rest, mild SOB with walking, speaks normally in sentences, can lie down, no retractions, pulse < 100.    - MODERATE: SOB at rest, SOB with minimal exertion and prefers to sit, cannot lie down flat, speaks in phrases, mild retractions, audible wheezing, pulse 100-120.    - SEVERE: Very SOB at rest, speaks in single words, struggling to breathe, sitting hunched forward, retractions, pulse > 120      No shortness of breath  6. FEVER: Do you have a fever? If Yes, ask:  What is your temperature, how was it measured, and when did it start?     No  7. CARDIAC HISTORY: Do you have any history of heart disease? (e.g., heart attack, congestive heart failure)      No  8. LUNG HISTORY: Do you have any history of lung disease?  (e.g., pulmonary embolus, asthma, emphysema)     No  9. PE RISK FACTORS: Do you have a history of blood clots? (or: recent major surgery, recent prolonged travel, bedridden)     No  10. OTHER SYMPTOMS: Do you have any other symptoms? (e.g., runny nose, wheezing, chest pain)       Sinus congestion  11. PREGNANCY: Is there any chance you are pregnant? When was your last menstrual period?       No  12. TRAVEL: Have you traveled out of the country in the last month? (e.g., travel history, exposures)       No  Protocols used: Cough - Acute Productive-A-AH

## 2023-09-19 NOTE — Telephone Encounter (Signed)
 Appt made for today

## 2023-09-19 NOTE — Patient Instructions (Signed)

## 2023-09-19 NOTE — Progress Notes (Signed)
 Subjective:  Patient ID: Caitlyn Newman, female    DOB: Jan 29, 1951, 73 y.o.   MRN: [id number]  Patient Care Team: [name], FNP as PCP - General (Family Medicine) Mallipeddi, Kennyth Pean, MD as PCP - Cardiology (Cardiology)   Chief Complaint:  Cough and Nasal Congestion (X 2 days )   HPI: Caitlyn Newman is a 73 y.o. female presenting on 09/19/2023 for Cough and Nasal Congestion (X 2 days )   Cough This is a new problem. The current episode started yesterday. The problem has been waxing and waning. The cough is Non-productive. Associated symptoms include chills, ear congestion, headaches, nasal congestion and postnasal drip. Pertinent negatives include no chest pain, ear pain, fever, heartburn, hemoptysis, myalgias, rash, rhinorrhea, sore throat, shortness of breath, sweats, weight loss or wheezing. Nothing aggravates the symptoms. She has tried nothing for the symptoms. The treatment provided no relief.      Relevant past medical, surgical, family, and social history reviewed and updated as indicated.  Allergies and medications reviewed and updated. Data reviewed: Chart in Epic.   Past Medical History:  Diagnosis Date   Anemia    Arthritis    Gout    Pneumonia    as a baby   Wears glasses     Past Surgical History:  Procedure Laterality Date   COLONOSCOPY W/ POLYPECTOMY     IR THORACENTESIS ASP PLEURAL SPACE W/IMG GUIDE  05/22/2022   LYMPH NODE BIOPSY Right 01/13/2013   Procedure: LYMPH NODE BIOPSY RIGHT GROIN;  Surgeon: Brandy Cal. Cornett, MD;  Location: [facility];  Service: General;  Laterality: Right;   NO PAST SURGERIES     TOTAL KNEE ARTHROPLASTY Right 11/24/2013   Procedure: RIGHT TOTAL KNEE ARTHROPLASTY;  Surgeon: Bevin Bucks, MD;  Location: [facility];  Service: Orthopedics;  Laterality: Right;    Social History   Socioeconomic History   Marital status: Single    Spouse name: Not on file   Number of children: Not on file   Years of  education: Not on file   Highest education level: Not on file  Occupational History   Not on file  Tobacco Use   Smoking status: Never   Smokeless tobacco: Never  Vaping Use   Vaping status: Never Used  Substance and Sexual Activity   Alcohol use: No   Drug use: No   Sexual activity: Not Currently  Other Topics Concern   Not on file  Social History Narrative   Not on file   Social Drivers of Health   Financial Resource Strain: Low Risk  (10/31/2022)   Overall Financial Resource Strain (CARDIA)    Difficulty of Paying Living Expenses: Not hard at all  Food Insecurity: No Food Insecurity (10/31/2022)   Hunger Vital Sign    Worried About Running Out of Food in the Last Year: Never true    Ran Out of Food in the Last Year: Never true  Transportation Needs: No Transportation Needs (10/31/2022)   PRAPARE - Administrator, Civil Service (Medical): No    Lack of Transportation (Non-Medical): No  Physical Activity: Inactive (10/31/2022)   Exercise Vital Sign    Days of Exercise per Week: 0 days    Minutes of Exercise per Session: 0 min  Stress: No Stress Concern Present (10/31/2022)   [employer] of Occupational Health - Occupational Stress Questionnaire    Feeling of Stress : Not at all  Social Connections: Moderately Isolated (10/31/2022)  Social Connection and Isolation Panel    Frequency of Communication with Friends and Family: More than three times a week    Frequency of Social Gatherings with Friends and Family: More than three times a week    Attends Religious Services: 1 to 4 times per year    Active Member of [employer] or Organizations: No    Attends Banker Meetings: Never    Marital Status: Never married  Intimate Partner Violence: Not At Risk (10/31/2022)   Humiliation, Afraid, Rape, and Kick questionnaire    Fear of Current or Ex-Partner: No    Emotionally Abused: No    Physically Abused: No    Sexually Abused: No    Outpatient Encounter  Medications as of 09/19/2023  Medication Sig   Cholecalciferol  (VITAMIN D ) 2000 UNITS CAPS Take 1 capsule by mouth daily.    diltiazem  (CARDIZEM  CD) 120 MG 24 hr capsule TAKE 1 CAPSULE BY MOUTH EVERY DAY   ferrous sulfate  325 (65 FE) MG tablet Take 1 tablet (325 mg total) by mouth 3 (three) times daily after meals.   fluticasone (FLONASE) 50 MCG/ACT nasal spray Place 2 sprays into both nostrils daily.   levocetirizine (XYZAL ) 5 MG tablet TAKE 1 TABLET BY MOUTH EVERY DAY IN THE EVENING   methotrexate (RHEUMATREX) 2.5 MG tablet Take 10 mg by mouth 2 (two) times a week. Wednesdays and Thursdays   pantoprazole  (PROTONIX ) 40 MG tablet Take 1 tablet (40 mg total) by mouth daily.   No facility-administered encounter medications on file as of 09/19/2023.    Allergies  Allergen Reactions   Penicillins     Other reaction(s): Other Unknown   Hydrocortisone Rash and Dermatitis   Penicillin G Rash and Dermatitis    Review of Systems  Constitutional:  Positive for chills. Negative for activity change, appetite change, diaphoresis, fatigue, fever, unexpected weight change and weight loss.  HENT:  Positive for congestion and postnasal drip. Negative for dental problem, drooling, ear discharge, ear pain, facial swelling, hearing loss, mouth sores, nosebleeds, rhinorrhea, sinus pressure, sinus pain, sneezing, sore throat, tinnitus, trouble swallowing and voice change.   Eyes:  Negative for photophobia and visual disturbance.  Respiratory:  Positive for cough. Negative for hemoptysis, shortness of breath and wheezing.   Cardiovascular:  Negative for chest pain.  Gastrointestinal:  Negative for heartburn.  Genitourinary:  Negative for difficulty urinating.  Musculoskeletal:  Negative for myalgias.  Skin:  Negative for rash.  Neurological:  Positive for headaches. Negative for dizziness, tremors, seizures, syncope, facial asymmetry, speech difficulty, weakness, light-headedness and numbness.   Psychiatric/Behavioral:  Negative for confusion.   All other systems reviewed and are negative.       Objective:  BP 137/71   Pulse 87   Temp 97.8 F (36.6 C)   Ht 5' 4 (1.626 m)   Wt 189 lb 9.6 oz (86 kg)   SpO2 97%   BMI 32.54 kg/m    Wt Readings from Last 3 Encounters:  09/19/23 189 lb 9.6 oz (86 kg)  09/11/23 191 lb 6.4 oz (86.8 kg)  06/18/23 188 lb 3.2 oz (85.4 kg)    Physical Exam Vitals and nursing note reviewed.  Constitutional:      General: She is not in acute distress.    Appearance: Normal appearance. She is obese. She is not ill-appearing, toxic-appearing or diaphoretic.  HENT:     Head: Normocephalic and atraumatic.     Right Ear: Hearing, tympanic membrane, ear canal and external ear normal.  Left Ear: Hearing, tympanic membrane, ear canal and external ear normal.     Nose: Congestion present.     Right Turbinates: Enlarged.     Left Turbinates: Enlarged.     Right Sinus: No maxillary sinus tenderness or frontal sinus tenderness.     Left Sinus: No maxillary sinus tenderness or frontal sinus tenderness.     Mouth/Throat:     Lips: Pink.     Mouth: Mucous membranes are moist.     Pharynx: Postnasal drip present. No pharyngeal swelling, oropharyngeal exudate, posterior oropharyngeal erythema or uvula swelling.     Comments: Cobblestoning to posterior oropharynx  Cardiovascular:     Rate and Rhythm: Normal rate and regular rhythm.     Heart sounds: Normal heart sounds.  Pulmonary:     Effort: Pulmonary effort is normal.     Breath sounds: Normal breath sounds. No wheezing, rhonchi or rales.   Musculoskeletal:     Cervical back: Neck supple.     Right lower leg: No edema.     Left lower leg: No edema.  Lymphadenopathy:     Cervical: No cervical adenopathy.   Skin:    General: Skin is warm and dry.     Capillary Refill: Capillary refill takes less than 2 seconds.   Neurological:     Mental Status: She is alert.     Gait: Gait abnormal  (using cane).     Results for orders placed or performed in visit on 06/18/23  Bayer DCA Hb A1c Waived   Collection Time: 06/18/23 11:27 AM  Result Value Ref Range   HB A1C (BAYER DCA - WAIVED) 5.8 (H) 4.8 - 5.6 %  Thyroid  Panel With TSH   Collection Time: 06/18/23 11:28 AM  Result Value Ref Range   TSH 1.870 0.450 - 4.500 uIU/mL   T4, Total 8.2 4.5 - 12.0 ug/dL   T3 Uptake Ratio 24 24 - 39 %   Free Thyroxine Index 2.0 1.2 - 4.9  CMP14+EGFR   Collection Time: 06/18/23 11:28 AM  Result Value Ref Range   Glucose 107 (H) 70 - 99 mg/dL   BUN 17 8 - 27 mg/dL   Creatinine, Ser 1.91 0.57 - 1.00 mg/dL   eGFR 88 >47 WG/NFA/2.13   BUN/Creatinine Ratio 23 12 - 28   Sodium 140 134 - 144 mmol/L   Potassium 4.4 3.5 - 5.2 mmol/L   Chloride 104 96 - 106 mmol/L   CO2 26 20 - 29 mmol/L   Calcium 8.9 8.7 - 10.3 mg/dL   Total Protein 6.3 6.0 - 8.5 g/dL   Albumin 3.8 (L) 3.9 - 4.9 g/dL   Globulin, Total 2.5 1.5 - 4.5 g/dL   Bilirubin Total 0.3 0.0 - 1.2 mg/dL   Alkaline Phosphatase 83 44 - 121 IU/L   AST 19 0 - 40 IU/L   ALT 14 0 - 32 IU/L  VITAMIN D  25 Hydroxy (Vit-D Deficiency, Fractures)   Collection Time: 06/18/23 11:28 AM  Result Value Ref Range   Vit D, 25-Hydroxy 35.1 30.0 - 100.0 ng/mL  Anemia Profile B   Collection Time: 06/18/23 11:28 AM  Result Value Ref Range   Total Iron Binding Capacity 220 (L) 250 - 450 ug/dL   UIBC 086 578 - 469 ug/dL   Iron 44 27 - 629 ug/dL   Iron Saturation 20 15 - 55 %   Ferritin 615 (H) 15 - 150 ng/mL   Vitamin B-12 525 232 - 1,245 pg/mL   Folate >  20.0 >3.0 ng/mL   WBC 7.4 3.4 - 10.8 x10E3/uL   RBC 4.09 3.77 - 5.28 x10E6/uL   Hemoglobin 12.9 11.1 - 15.9 g/dL   Hematocrit 16.1 09.6 - 46.6 %   MCV 94 79 - 97 fL   MCH 31.5 26.6 - 33.0 pg   MCHC 33.6 31.5 - 35.7 g/dL   RDW 04.5 40.9 - 81.1 %   Platelets 149 (L) 150 - 450 x10E3/uL   Neutrophils 68 Not Estab. %   Lymphs 24 Not Estab. %   Monocytes 5 Not Estab. %   Eos 2 Not Estab. %   Basos 1  Not Estab. %   Neutrophils Absolute 5.1 1.4 - 7.0 x10E3/uL   Lymphocytes Absolute 1.8 0.7 - 3.1 x10E3/uL   Monocytes Absolute 0.4 0.1 - 0.9 x10E3/uL   EOS (ABSOLUTE) 0.1 0.0 - 0.4 x10E3/uL   Basophils Absolute 0.0 0.0 - 0.2 x10E3/uL   Immature Granulocytes 0 Not Estab. %   Immature Grans (Abs) 0.0 0.0 - 0.1 x10E3/uL   Retic Ct Pct 1.0 0.6 - 2.6 %  Lab report - scanned   Collection Time: 08/19/23 12:00 AM  Result Value Ref Range   EGFR (African American) 90        Pertinent labs & imaging results that were available during my care of the patient were reviewed by me and considered in my medical decision making.  Assessment & Plan:  Caitlyn Newman was seen today for cough and nasal congestion.  Diagnoses and all orders for this visit:  Viral URI Acute cough No indications of acute bacterial infection. Symptomatic care discussed in detail. Aware she can take Coricidin HBP over the counter for symptom relief. Start Flonase. Viral testing pending. Aware to report new, worsening, or persistent symptoms.  -     COVID-19, Flu A+B and RSV -     fluticasone (FLONASE) 50 MCG/ACT nasal spray; Place 2 sprays into both nostrils daily.     Continue all other maintenance medications.  Follow up plan: Return if symptoms worsen or fail to improve.   Continue healthy lifestyle choices, including diet (rich in fruits, vegetables, and lean proteins, and low in salt and simple carbohydrates) and exercise (at least 30 minutes of moderate physical activity daily).  Educational handout given for URI  The above assessment and management plan was discussed with the patient. The patient verbalized understanding of and has agreed to the management plan. Patient is aware to call the clinic if they develop any new symptoms or if symptoms persist or worsen. Patient is aware when to return to the clinic for a follow-up visit. Patient educated on when it is appropriate to go to the emergency department.   Caitlyn Parrot, FNP-C [facility] [phone]

## 2023-09-21 LAB — COVID-19, FLU A+B AND RSV
Influenza A, NAA: NOT DETECTED
Influenza B, NAA: NOT DETECTED
RSV, NAA: NOT DETECTED
SARS-CoV-2, NAA: NOT DETECTED

## 2023-09-23 ENCOUNTER — Ambulatory Visit: Payer: Self-pay | Admitting: Family Medicine

## 2023-09-24 ENCOUNTER — Telehealth: Payer: Self-pay

## 2023-09-24 NOTE — Telephone Encounter (Signed)
 Copied from CRM [id number]. Topic: Clinical - Medical Advice >> Sep 24, 2023  9:56 AM [name] wrote: Reason for CRM: Patient called back for COVID results. Advised of cough medicine suggestion.

## 2023-09-24 NOTE — Telephone Encounter (Signed)
Refer to lab results.  

## 2023-10-28 ENCOUNTER — Ambulatory Visit: Admitting: Nurse Practitioner

## 2023-10-28 ENCOUNTER — Ambulatory Visit: Payer: Self-pay

## 2023-10-28 NOTE — Telephone Encounter (Signed)
 FYI Only or Action Required?: FYI only for provider.  Patient was last seen in primary care on 09/19/2023 by Caitlyn Rock HERO, FNP.  Called Nurse Triage reporting URI.  Symptoms began several days ago.  Interventions attempted: Prescription medications: Flonase .  Symptoms are: unchanged.  Triage Disposition: See PCP When Office is Open (Within 3 Days)  Patient/caregiver understands and will follow disposition?: Yes  Pt states she is using flonase  every other day, nose is dry but coughing up more yellow mucus. Has wheezing at times as well. Scheduled OV today 245 with [name], NP   Copied from CRM [id number]. Topic: Clinical - Red Word Triage >> Oct 28, 2023  9:26 AM Caitlyn Newman wrote: Red Word that prompted transfer to Nurse Triage: Cold/Cough since 07/17 Mucus is yellow Reason for Disposition  [1] Sinus congestion (pressure, fullness) AND [2] present > 10 days  Answer Assessment - Initial Assessment Questions 2. ONSET: When did the sinus pain start?  (e.g., hours, days)      10/24/23 5. NASAL CONGESTION: Is the nose blocked? If Yes, ask: Can you open it or must you breathe through your mouth?     yes 6. NASAL DISCHARGE: Do you have discharge from your nose? If so ask, What color?     no 7. FEVER: Do you have a fever? If Yes, ask: What is it, how was it measured, and when did it start?      no 8. OTHER SYMPTOMS: Do you have any other symptoms? (e.g., sore throat, cough, earache, difficulty breathing)     Cough with congestion with yellow mucus, wheezing at times  Protocols used: Sinus Pain or Congestion-A-AH

## 2023-10-30 ENCOUNTER — Encounter: Payer: Self-pay | Admitting: Nurse Practitioner

## 2023-10-30 ENCOUNTER — Ambulatory Visit (INDEPENDENT_AMBULATORY_CARE_PROVIDER_SITE_OTHER): Admitting: Nurse Practitioner

## 2023-10-30 VITALS — BP 130/72 | HR 70 | Temp 97.2°F | Ht 64.0 in | Wt 194.0 lb

## 2023-10-30 DIAGNOSIS — J069 Acute upper respiratory infection, unspecified: Secondary | ICD-10-CM | POA: Diagnosis not present

## 2023-10-30 MED ORDER — GUAIFENESIN 400 MG PO TABS: 400.0000 mg | ORAL_TABLET | Freq: Four times a day (QID) | ORAL | 0 refills | Status: DC | PRN

## 2023-10-30 MED ORDER — AZELASTINE HCL 0.1 % NA SOLN: 1.0000 | Freq: Two times a day (BID) | NASAL | 12 refills | Status: AC

## 2023-10-30 NOTE — Progress Notes (Signed)
 Acute Office Visit  Subjective:     Patient ID: Caitlyn Newman, female    DOB: [birth year], 73 y.o.   MRN: [id number]  Chief Complaint  Patient presents with   Cough    Cough started 2 days ago     HPI Caitlyn Newman is a 73 y.o. female presents 10/30/2023 for an acute visit who complains of congestion and cough described as productive of clear sputum for 3 days.has not take any OTC cough suppressant. She denies a history of chest pain, dizziness, fatigue, myalgias, nausea, vomiting, and wheezing and denies a history of asthma. Patient denies smoke cigarettes.   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   Class 1 obesity 05/18/2022   Adrenal nodule (HCC) 06/14/2022   Liver nodule 06/14/2022   Iron deficiency anemia due to chronic blood loss 06/14/2022   Rheumatoid arthritis involving multiple sites with positive rheumatoid factor (HCC) 06/14/2022   Atrial flutter, paroxysmal (HCC) 07/20/2022   Prediabetes 09/18/2022   Vitamin D  deficiency 09/18/2022   History of pericarditis 06/18/2023   Resolved Ambulatory Problems    Diagnosis Date Noted   Lymphadenopathy, inguinal 01/05/2013   Post-operative state 01/26/2013   Overweight (BMI 25.0-29.9) 11/25/2013   Postoperative anemia due to acute blood loss 11/25/2013   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  HENT:  Positive for congestion.   Respiratory:  Positive for cough. Negative for  wheezing.        Non productive  Cardiovascular:  Negative for chest pain and leg swelling.  Gastrointestinal:  Negative for nausea and vomiting.  Skin:  Negative for itching and rash.  Neurological:  Negative for dizziness and headaches.   Negative unless indicated in HPI    Objective:    BP 130/72   Pulse 70   Temp (!) 97.2 F (36.2 C) (Temporal)   Ht 5' 4 (1.626 m)   Wt 194 lb (88 kg)   SpO2 96%   BMI 33.30 kg/m  BP Readings from Last 3 Encounters:  10/30/23 130/72  09/19/23 137/71  09/11/23 (!) 155/72   Wt Readings from Last 3 Encounters:  10/30/23 194 lb (88 kg)  09/19/23 189 lb 9.6 oz (86 kg)  09/11/23 191 lb 6.4 oz (86.8 kg)      Physical Exam Vitals and nursing note reviewed.  Constitutional:      General: She is not in acute distress. HENT:     Head: Normocephalic and atraumatic.     Nose: Congestion present.  Eyes:     General: No scleral icterus.    Extraocular Movements: Extraocular movements intact.     Conjunctiva/sclera: Conjunctivae normal.     Pupils: Pupils are equal, round, and reactive to light.  Cardiovascular:     Heart sounds: Normal heart sounds.  Pulmonary:     Effort: Pulmonary effort is normal.     Breath sounds: Normal breath sounds.  Musculoskeletal:  General: Normal range of motion.     Right lower leg: No edema.     Left lower leg: No edema.  Skin:    General: Skin is warm and dry.     Findings: No rash.  Neurological:     Mental Status: She is alert and oriented to person, place, and time.  Psychiatric:        Mood and Affect: Mood normal.        Behavior: Behavior normal.        Thought Content: Thought content normal.        Judgment: Judgment normal.     No results found for any visits on 10/30/23.      Assessment & Plan:  URI with cough and congestion -     Azelastine  HCl; Place 1 spray into both nostrils 2 (two) times daily. Use in each nostril as directed  Dispense: 30 mL; Refill: 12 -      guaiFENesin ; Take 1 tablet (400 mg total) by mouth every 6 (six) hours as needed.  Dispense: 30 tablet; Refill: 0   [name] is a 73 year old African-American female seen today for URI with congestion, no acute distress  viral upper respiratory illness Astelin  twice daily for congestion Guaifenesin  400 mg 1 time 3 times daily for cough Continue all previously prescribed Flonase  and Claritin Symptomatic therapy suggested: push fluids, rest, return office visit prn if symptoms persist or worsen. Lack of antibiotic effectiveness discussed with her. Call or return to clinic prn if these symptoms worsen or fail to improve as anticipated.  Return if symptoms worsen or fail to improve.  Caitlyn Husby [place], DNP [facility] [address] [place], [place] [address] [phone]  Note: This document was prepared by [employer] voice dictation technology and any errors that results from this process are unintentional.

## 2023-11-21 ENCOUNTER — Ambulatory Visit: Attending: Internal Medicine | Admitting: Internal Medicine

## 2023-11-21 ENCOUNTER — Encounter: Payer: Self-pay | Admitting: Internal Medicine

## 2023-11-21 VITALS — BP 128/58 | HR 57 | Ht 64.0 in | Wt 195.0 lb

## 2023-11-21 DIAGNOSIS — I319 Disease of pericardium, unspecified: Secondary | ICD-10-CM

## 2023-11-21 DIAGNOSIS — M069 Rheumatoid arthritis, unspecified: Secondary | ICD-10-CM

## 2023-11-21 DIAGNOSIS — I5032 Chronic diastolic (congestive) heart failure: Secondary | ICD-10-CM | POA: Diagnosis not present

## 2023-11-21 NOTE — Patient Instructions (Signed)
Medication Instructions:  Continue all current medications.  Labwork: none  Testing/Procedures: none  Follow-Up: As needed.    Any Other Special Instructions Will Be Listed Below (If Applicable).  If you need a refill on your cardiac medications before your next appointment, please call your pharmacy.  

## 2023-11-21 NOTE — Progress Notes (Signed)
 Cardiology Office Note  Date: 11/21/2023   ID: Caitlyn Newman, Caitlyn Newman 02-02-1951, MRN [id number]  PCP:  Severa Rock HERO, FNP  Cardiologist:  Diannah SHAUNNA Maywood, MD Electrophysiologist:  None    History of Present Illness: Caitlyn Newman is a 73 y.o. female known to have rheumatoid arthritis, HTN, DM 2, pericarditis with pericardial effusion with no evidence of tamponade, paroxysmal atrial flutter is here for follow-up visit.  Patient was admitted to [facility] for sepsis secondary to community-acquired pneumonia c/w bilateral pleural effusions and acute pericarditis with pericardial effusion with no evidence of cardiac tamponade in 2/24. Hospital course was complicated by paroxysmal atrial flutter with RVR (lasted for 1 hour) started on diltiazem drip but AC was not started due to pericardial effusion. She is status post thoracentesis. Echocardiogram upon hospital discharge showed complete resolution of pericardial effusion.  Event monitor in 3/24 showed no evidence of atrial arrhythmias.    No interval angina or DOE.  She has some mild chest tightness after eating a meal but not with exertion.  No dizziness, syncope, palpitations or leg swelling.  Doing overall great.  Past Medical History:  Diagnosis Date   Anemia    Arthritis    Gout    Pneumonia    as a baby   Wears glasses     Past Surgical History:  Procedure Laterality Date   COLONOSCOPY W/ POLYPECTOMY     IR THORACENTESIS ASP PLEURAL SPACE W/IMG GUIDE  05/22/2022   LYMPH NODE BIOPSY Right 01/13/2013   Procedure: LYMPH NODE BIOPSY RIGHT GROIN;  Surgeon: Debby LABOR. Cornett, MD;  Location: [facility];  Service: General;  Laterality: Right;   NO PAST SURGERIES     TOTAL KNEE ARTHROPLASTY Right 11/24/2013   Procedure: RIGHT TOTAL KNEE ARTHROPLASTY;  Surgeon: Donnice JONETTA Car, MD;  Location: [facility];  Service: Orthopedics;  Laterality: Right;    Current Outpatient Medications  Medication Sig Dispense  Refill   azelastine (ASTELIN) 0.1 % nasal spray Place 1 spray into both nostrils 2 (two) times daily. Use in each nostril as directed 30 mL 12   Cholecalciferol (VITAMIN D) 2000 UNITS CAPS Take 1 capsule by mouth daily.      diltiazem (CARDIZEM CD) 120 MG 24 hr capsule TAKE 1 CAPSULE BY MOUTH EVERY DAY 90 capsule 3   ferrous sulfate 325 (65 FE) MG tablet Take 1 tablet (325 mg total) by mouth 3 (three) times daily after meals.  3   fluticasone (FLONASE) 50 MCG/ACT nasal spray Place 2 sprays into both nostrils daily. 16 g 6   guaifenesin (HUMIBID E) 400 MG TABS tablet Take 1 tablet (400 mg total) by mouth every 6 (six) hours as needed. 30 tablet 0   levocetirizine (XYZAL) 5 MG tablet TAKE 1 TABLET BY MOUTH EVERY DAY IN THE EVENING 90 tablet 1   methotrexate (RHEUMATREX) 2.5 MG tablet Take 10 mg by mouth 2 (two) times a week. Wednesdays and Thursdays     pantoprazole (PROTONIX) 40 MG tablet Take 1 tablet (40 mg total) by mouth daily. 30 tablet 0   No current facility-administered medications for this visit.   Allergies:  Penicillins, Hydrocortisone, and Penicillin g   Social History: The patient  reports that she has never smoked. She has never used smokeless tobacco. She reports that she does not drink alcohol and does not use drugs.   Family History: The patient's family history includes Bone cancer in her paternal grandfather; Diabetes in her brother, father, maternal  grandfather, maternal grandmother, mother, and sister; Hypertension in her father and mother; Kidney disease in her mother; Ovarian cancer in her paternal aunt.   ROS:  Please see the history of present illness. Otherwise, complete review of systems is positive for none.  All other systems are reviewed and negative.   Physical Exam: VS:  There were no vitals taken for this visit., BMI There is no height or weight on file to calculate BMI.  Wt Readings from Last 3 Encounters:  10/30/23 194 lb (88 kg)  09/19/23 189 lb 9.6 oz (86  kg)  09/11/23 191 lb 6.4 oz (86.8 kg)    General: Patient appears comfortable at rest. HEENT: Conjunctiva and lids normal, oropharynx clear with moist mucosa. Neck: Supple, no elevated JVP or carotid bruits, no thyromegaly. Lungs: Clear to auscultation, nonlabored breathing at rest. Cardiac: Regular rate and rhythm, no S3 or significant systolic murmur, no pericardial rub. Abdomen: Soft, nontender, no hepatomegaly, bowel sounds present, no guarding or rebound. Extremities: No pitting edema, distal pulses 2+. Skin: Warm and dry. Musculoskeletal: No kyphosis. Neuropsychiatric: Alert and oriented x3, affect grossly appropriate.  ECG:  NSR  Recent Labwork: 06/18/2023: ALT 14; AST 19; BUN 17; Creatinine, Ser 0.73; Hemoglobin 12.9; Platelets 149; Potassium 4.4; Sodium 140; TSH 1.870     Component Value Date/Time   CHOL 180 12/18/2022 1052   TRIG 69 12/18/2022 1052   HDL 68 12/18/2022 1052   CHOLHDL 2.6 12/18/2022 1052   LDLCALC 99 12/18/2022 1052    Other Studies Reviewed Today: Event monitor in 06/2022 No atrial or ventricular arrhythmias 1 brief run of asymptomatic SVT Unremarkable event monitor overall  Echocardiogram on 06/13/2022 Pericardial effusion is completely resolved compared to 05/2022 echocardiogram Normal LVEF  Assessment and Plan:  # Acute pericarditis with pericardial effusion with no tamponade in 2/24: No recurrences of pericarditis.  Patient completed high dose ASA therapy for 2 weeks and colchicine therapy for 3 months.  Repeat echocardiogram showed complete resolution of pericardial effusion.  # Paroxysmal atrial flutter likely secondary to sepsis and acute pericarditis during 2/24 hospitalization: Brief atrial flutter episode that lasted for 1 hour and stabilized on IV diltiazem. Event monitor in 3/24 showed no evidence of atrial arrhythmias.  Continue p.o. diltiazem at 120 mg once daily.  Since has no recurrence of atrial arrhythmias on the event monitor from 3/24  and no palpitations in the interval, okay to defer systemic AC.  Patient does not want to undergo OSA testing.  Patient can follow-up with PCP for diltiazem refills.   I have spent a total of 30 minutes with patient reviewing chart, EKGs, labs and examining patient as well as establishing an assessment and plan that was discussed with the patient.  > 50% of time was spent in direct patient care.     Medication Adjustments/Labs and Tests Ordered: Current medicines are reviewed at length with the patient today.  Concerns regarding medicines are outlined above.   Tests Ordered: Orders Placed This Encounter  Procedures   EKG 12-Lead    Medication Changes: No orders of the defined types were placed in this encounter.   Disposition:  Follow up as needed  Signed, Dondre Catalfamo Arleta Maywood, MD, 11/21/2023 11:44 AM    [facility] [address], [place], [place] [address]

## 2023-12-14 ENCOUNTER — Other Ambulatory Visit: Payer: Self-pay | Admitting: Family Medicine

## 2023-12-14 DIAGNOSIS — J301 Allergic rhinitis due to pollen: Secondary | ICD-10-CM

## 2023-12-19 ENCOUNTER — Ambulatory Visit

## 2023-12-19 VITALS — BP 128/58 | HR 57 | Ht 64.0 in | Wt 195.0 lb

## 2023-12-19 DIAGNOSIS — Z13820 Encounter for screening for osteoporosis: Secondary | ICD-10-CM

## 2023-12-19 DIAGNOSIS — Z0000 Encounter for general adult medical examination without abnormal findings: Secondary | ICD-10-CM

## 2023-12-19 NOTE — Progress Notes (Signed)
 Subjective:   Casey Argo is a 73 y.o. who presents for a Medicare Wellness preventive visit.  As a reminder, Annual Wellness Visits don't include a physical exam, and some assessments may be limited, especially if this visit is performed virtually. We may recommend an in-person follow-up visit with your provider if needed.  Visit Complete: Virtual I connected with  Irisa Ostrom on 12/19/23 by a audio enabled telemedicine application and verified that I am speaking with the correct person using two identifiers.  Patient Location: Home  Provider Location: Home Office  I discussed the limitations of evaluation and management by telemedicine. The patient expressed understanding and agreed to proceed.  Vital Signs: Because this visit was a virtual/telehealth visit, some criteria may be missing or patient reported. Any vitals not documented were not able to be obtained and vitals that have been documented are patient reported.  VideoDeclined- This patient declined Librarian, academic. Therefore the visit was completed with audio only.  Persons Participating in Visit: Patient.  AWV Questionnaire: No: Patient Medicare AWV questionnaire was not completed prior to this visit.  Cardiac Risk Factors include: advanced age (>24men, >27 women);obesity (BMI >30kg/m2)     Objective:    Today's Vitals   12/19/23 1122  BP: (!) 128/58  Pulse: (!) 57  Weight: 195 lb (88.5 kg)  Height: 5' 4 (1.626 m)   Body mass index is 33.47 kg/m.     12/19/2023   11:31 AM 10/31/2022    1:49 PM 07/26/2022    6:06 PM 05/19/2022    6:59 AM 05/18/2022    7:54 AM 11/24/2013    4:35 PM 11/17/2013    1:22 PM  Advanced Directives  Does Patient Have a Medical Advance Directive? No No No  No No  Patient does not have advance directive;Patient would not like information   Would patient like information on creating a medical advance directive?  No - Patient declined No - Patient  declined Yes (Inpatient - patient defers creating a medical advance directive at this time - Information given)  No - patient declined information       Data saved with a previous flowsheet row definition    Current Medications (verified) Outpatient Encounter Medications as of 12/19/2023  Medication Sig   azelastine  (ASTELIN ) 0.1 % nasal spray Place 1 spray into both nostrils 2 (two) times daily. Use in each nostril as directed   Cholecalciferol  (VITAMIN D ) 2000 UNITS CAPS Take 1 capsule by mouth daily.    diltiazem  (CARDIZEM  CD) 120 MG 24 hr capsule TAKE 1 CAPSULE BY MOUTH EVERY DAY   ferrous sulfate  325 (65 FE) MG tablet Take 1 tablet (325 mg total) by mouth 3 (three) times daily after meals.   fluticasone  (FLONASE ) 50 MCG/ACT nasal spray Place 2 sprays into both nostrils daily.   folic acid  (FOLVITE ) 1 MG tablet Take 1 mg by mouth daily.   guaifenesin  (HUMIBID E) 400 MG TABS tablet Take 1 tablet (400 mg total) by mouth every 6 (six) hours as needed.   levocetirizine (XYZAL ) 5 MG tablet TAKE 1 TABLET BY MOUTH EVERY DAY IN THE EVENING   methotrexate (RHEUMATREX) 2.5 MG tablet Take 10 mg by mouth 2 (two) times a week. Wednesdays and Thursdays   pantoprazole  (PROTONIX ) 40 MG tablet Take 1 tablet (40 mg total) by mouth daily. (Patient not taking: Reported on 12/19/2023)   No facility-administered encounter medications on file as of 12/19/2023.    Allergies (verified) Penicillins, Hydrocortisone, and Penicillin  g   History: Past Medical History:  Diagnosis Date   Anemia    Arthritis    Gout    Pneumonia    as a baby   Wears glasses    Past Surgical History:  Procedure Laterality Date   COLONOSCOPY W/ POLYPECTOMY     IR THORACENTESIS ASP PLEURAL SPACE W/IMG GUIDE  05/22/2022   LYMPH NODE BIOPSY Right 01/13/2013   Procedure: LYMPH NODE BIOPSY RIGHT GROIN;  Surgeon: Debby LABOR. Cornett, MD;  Location: [facility];  Service: General;  Laterality: Right;   NO PAST SURGERIES      TOTAL KNEE ARTHROPLASTY Right 11/24/2013   Procedure: RIGHT TOTAL KNEE ARTHROPLASTY;  Surgeon: Donnice JONETTA Car, MD;  Location: [facility];  Service: Orthopedics;  Laterality: Right;   Family History  Problem Relation Age of Onset   Diabetes Mother    Kidney disease Mother    Hypertension Mother    Hypertension Father    Diabetes Father    Diabetes Sister    Ovarian cancer Paternal Aunt    Diabetes Maternal Grandmother    Diabetes Maternal Grandfather    Bone cancer Paternal Grandfather    Diabetes Brother    Breast cancer Neg Hx    Social History   Socioeconomic History   Marital status: Single    Spouse name: Not on file   Number of children: Not on file   Years of education: Not on file   Highest education level: Not on file  Occupational History   Not on file  Tobacco Use   Smoking status: Never   Smokeless tobacco: Never  Vaping Use   Vaping status: Never Used  Substance and Sexual Activity   Alcohol use: No   Drug use: No   Sexual activity: Not Currently  Other Topics Concern   Not on file  Social History Narrative   Not on file   Social Drivers of Health   Financial Resource Strain: Low Risk  (12/19/2023)   Overall Financial Resource Strain (CARDIA)    Difficulty of Paying Living Expenses: Not hard at all  Food Insecurity: No Food Insecurity (12/19/2023)   Hunger Vital Sign    Worried About Running Out of Food in the Last Year: Never true    Ran Out of Food in the Last Year: Never true  Transportation Needs: No Transportation Needs (12/19/2023)   PRAPARE - Administrator, Civil Service (Medical): No    Lack of Transportation (Non-Medical): No  Physical Activity: Inactive (12/19/2023)   Exercise Vital Sign    Days of Exercise per Week: 0 days    Minutes of Exercise per Session: 0 min  Stress: No Stress Concern Present (12/19/2023)   [employer] of Occupational Health - Occupational Stress Questionnaire    Feeling of Stress: Not at all   Social Connections: Socially Isolated (12/19/2023)   Social Connection and Isolation Panel    Frequency of Communication with Friends and Family: Once a week    Frequency of Social Gatherings with Friends and Family: Once a week    Attends Religious Services: Never    Database administrator or Organizations: No    Attends Engineer, structural: Never    Marital Status: Never married    Tobacco Counseling Counseling given: Not Answered    Clinical Intake:  Pre-visit preparation completed: Yes  Pain : No/denies pain     BMI - recorded: 33.47 Nutritional Status: BMI > 30  Obese Nutritional  Risks: None Diabetes: No  Lab Results  Component Value Date   HGBA1C 5.8 (H) 06/18/2023   HGBA1C 5.2 12/18/2022   HGBA1C 5.7 (H) 09/18/2022     How often do you need to have someone help you when you read instructions, pamphlets, or other written materials from your doctor or pharmacy?: 1 - Never  Interpreter Needed?: No  Information entered by :: alia t/cma   Activities of Daily Living     12/19/2023   11:28 AM  In your present state of health, do you have any difficulty performing the following activities:  Hearing? 0  Vision? 0  Difficulty concentrating or making decisions? 0  Walking or climbing stairs? 0  Dressing or bathing? 0  Doing errands, shopping? 1  Comment pt's siblings-brother/sister  Preparing Food and eating ? N  Using the Toilet? N  In the past six months, have you accidently leaked urine? N  Do you have problems with loss of bowel control? N  Managing your Medications? N  Managing your Finances? N  Housekeeping or managing your Housekeeping? N    Patient Care Team: Severa Rock HERO, FNP as PCP - General (Family Medicine) Mallipeddi, Diannah SQUIBB, MD as PCP - Cardiology (Cardiology)  I have updated your Care Teams any recent Medical Services you may have received from other providers in the past year.     Assessment:   This is a routine wellness  examination for Tien.  Hearing/Vision screen Hearing Screening - Comments:: Pt denies hearing dif Vision Screening - Comments:: Pt wear glasses/pt goes to [facility] [place]--may change to the [place]/last ov about 66yrs/suggest to get vision   Goals Addressed             This Visit's Progress    Exercise 3x per week (30 min per time)   On track      Depression Screen     12/19/2023   11:32 AM 09/11/2023   11:23 AM 06/18/2023   11:24 AM 12/18/2022   10:22 AM 10/31/2022    1:48 PM 09/18/2022   11:58 AM 06/14/2022    3:02 PM  PHQ 2/9 Scores  PHQ - 2 Score 0 0 0 0 0 0 2  PHQ- 9 Score  0 0 0 0 0 4    Fall Risk     12/19/2023   11:24 AM 09/11/2023   11:23 AM 12/18/2022   10:22 AM 10/31/2022    1:47 PM 09/18/2022   11:58 AM  Fall Risk   Falls in the past year? 0 0 0 0 0  Number falls in past yr: 0 0  0   Injury with Fall? 0 0  0   Risk for fall due to : No Fall Risks No Fall Risks  No Fall Risks   Follow up Falls evaluation completed Falls evaluation completed  Falls prevention discussed     MEDICARE RISK AT HOME:  Medicare Risk at Home Any stairs in or around the home?: Yes If so, are there any without handrails?: Yes Home free of loose throw rugs in walkways, pet beds, electrical cords, etc?: Yes Adequate lighting in your home to reduce risk of falls?: Yes Life alert?: No Use of a cane, walker or w/c?: Yes Grab bars in the bathroom?: No Shower chair or bench in shower?: Yes Elevated toilet seat or a handicapped toilet?: Yes  TIMED UP AND GO:  Was the test performed?  no  Cognitive Function: 6CIT completed  12/19/2023   11:36 AM 10/31/2022    1:49 PM  6CIT Screen  What Year? 0 points 0 points  What month? 0 points 0 points  What time? 0 points 0 points  Count back from 20 0 points 0 points  Months in reverse 0 points 0 points  Repeat phrase 0 points 0 points  Total Score 0 points 0 points    Immunizations Immunization History  Administered  Date(s) Administered   Moderna Sars-Covid-2 Vaccination 07/09/2019, 08/12/2019    Screening Tests Health Maintenance  Topic Date Due   Hepatitis C Screening  Never done   Pneumococcal Vaccine: 50+ Years (1 of 2 - PCV) Never done   Zoster Vaccines- Shingrix (1 of 2) Never done   DEXA SCAN  Never done   COVID-19 Vaccine (3 - Moderna risk series) 09/09/2019   Influenza Vaccine  Never done   Colonoscopy  06/17/2024 (Originally 02/20/2023)   Mammogram  11/06/2024   Medicare Annual Wellness (AWV)  12/18/2024   HPV VACCINES  Aged Out   Meningococcal B Vaccine  Aged Out   DTaP/Tdap/Td  Discontinued    Health Maintenance Items Addressed: See Nurse Notes at the end of this note  Additional Screening:  Vision Screening: Recommended annual ophthalmology exams for early detection of glaucoma and other disorders of the eye. Is the patient up to date with their annual eye exam?  No  Who is the provider or what is the name of the office in which the patient attends annual eye exams? [facility]  Dental Screening: Recommended annual dental exams for proper oral hygiene  Community Resource Referral / Chronic Care Management: CRR required this visit?  No   CCM required this visit?  No   Plan:    I have personally reviewed and noted the following in the patient's chart:   Medical and social history Use of alcohol, tobacco or illicit drugs  Current medications and supplements including opioid prescriptions. Patient is not currently taking opioid prescriptions. Functional ability and status Nutritional status Physical activity Advanced directives List of other physicians Hospitalizations, surgeries, and ER visits in previous 12 months Vitals Screenings to include cognitive, depression, and falls Referrals and appointments  In addition, I have reviewed and discussed with patient certain preventive protocols, quality metrics, and best practice recommendations. A written  personalized care plan for preventive services as well as general preventive health recommendations were provided to patient.   Ozie Ned, CMA   12/19/2023   After Visit Summary: (Declined) Due to this being a telephonic visit, with patients personalized plan was offered to patient but patient Declined AVS at this time   Notes: PCP Follow Up Recommendations: pt is aware and due for the following: Hep C, flu,pneumonia, shingles vaccine. Per pt will talk to pcp at next visit 12/24/23 before getting any vaccines done.

## 2023-12-19 NOTE — Patient Instructions (Signed)
 [name],  Thank you for taking the time for your Medicare Wellness Visit. I appreciate your continued commitment to your health goals. Please review the care plan we discussed, and feel free to reach out if I can assist you further.  Medicare recommends these wellness visits once per year to help you and your care team stay ahead of potential health issues. These visits are designed to focus on prevention, allowing your provider to concentrate on managing your acute and chronic conditions during your regular appointments.  Please note that Annual Wellness Visits do not include a physical exam. Some assessments may be limited, especially if the visit was conducted virtually. If needed, we may recommend a separate in-person follow-up with your provider.  Ongoing Care Seeing your primary care provider every 3 to 6 months helps us  monitor your health and provide consistent, personalized care.   Referrals If a referral was made during today's visit and you haven't received any updates within two weeks, please contact the referred provider directly to check on the status.  Recommended Screenings:  Health Maintenance  Topic Date Due   Hepatitis C Screening  Never done   Pneumococcal Vaccine for age over 36 (1 of 2 - PCV) Never done   Zoster (Shingles) Vaccine (1 of 2) Never done   DEXA scan (bone density measurement)  Never done   COVID-19 Vaccine (3 - Moderna risk series) 09/09/2019   Medicare Annual Wellness Visit  11/07/2023   Flu Shot  Never done   Colon Cancer Screening  06/17/2024*   Breast Cancer Screening  11/06/2024   HPV Vaccine  Aged Out   Meningitis B Vaccine  Aged Out   DTaP/Tdap/Td vaccine  Discontinued  *Topic was postponed. The date shown is not the original due date.       12/19/2023   11:31 AM  Advanced Directives  Does Patient Have a Medical Advance Directive? No   Advance Care Planning is important because it: Ensures you receive medical care that aligns with  your values, goals, and preferences. Provides guidance to your family and loved ones, reducing the emotional burden of decision-making during critical moments.  Vision: Annual vision screenings are recommended for early detection of glaucoma, cataracts, and diabetic retinopathy. These exams can also reveal signs of chronic conditions such as diabetes and high blood pressure.  Dental: Annual dental screenings help detect early signs of oral cancer, gum disease, and other conditions linked to overall health, including heart disease and diabetes.  Please see the attached documents for additional preventive care recommendations.

## 2023-12-20 ENCOUNTER — Encounter: Admitting: Family Medicine

## 2023-12-24 ENCOUNTER — Ambulatory Visit: Payer: Self-pay | Admitting: Family Medicine

## 2023-12-24 ENCOUNTER — Encounter: Payer: Self-pay | Admitting: Family Medicine

## 2023-12-24 ENCOUNTER — Ambulatory Visit (INDEPENDENT_AMBULATORY_CARE_PROVIDER_SITE_OTHER): Admitting: Family Medicine

## 2023-12-24 ENCOUNTER — Ambulatory Visit (INDEPENDENT_AMBULATORY_CARE_PROVIDER_SITE_OTHER)

## 2023-12-24 VITALS — BP 125/58 | HR 64 | Temp 97.0°F | Ht 64.0 in | Wt 191.8 lb

## 2023-12-24 DIAGNOSIS — R6889 Other general symptoms and signs: Secondary | ICD-10-CM | POA: Diagnosis not present

## 2023-12-24 DIAGNOSIS — Z0000 Encounter for general adult medical examination without abnormal findings: Secondary | ICD-10-CM

## 2023-12-24 DIAGNOSIS — Z0001 Encounter for general adult medical examination with abnormal findings: Secondary | ICD-10-CM

## 2023-12-24 DIAGNOSIS — H6122 Impacted cerumen, left ear: Secondary | ICD-10-CM

## 2023-12-24 DIAGNOSIS — E559 Vitamin D deficiency, unspecified: Secondary | ICD-10-CM

## 2023-12-24 DIAGNOSIS — Z1159 Encounter for screening for other viral diseases: Secondary | ICD-10-CM

## 2023-12-24 DIAGNOSIS — R7303 Prediabetes: Secondary | ICD-10-CM | POA: Diagnosis not present

## 2023-12-24 DIAGNOSIS — Z780 Asymptomatic menopausal state: Secondary | ICD-10-CM | POA: Diagnosis not present

## 2023-12-24 DIAGNOSIS — I4892 Unspecified atrial flutter: Secondary | ICD-10-CM

## 2023-12-24 DIAGNOSIS — Z13820 Encounter for screening for osteoporosis: Secondary | ICD-10-CM

## 2023-12-24 DIAGNOSIS — M0579 Rheumatoid arthritis with rheumatoid factor of multiple sites without organ or systems involvement: Secondary | ICD-10-CM | POA: Diagnosis not present

## 2023-12-24 DIAGNOSIS — Z136 Encounter for screening for cardiovascular disorders: Secondary | ICD-10-CM | POA: Diagnosis not present

## 2023-12-24 DIAGNOSIS — D500 Iron deficiency anemia secondary to blood loss (chronic): Secondary | ICD-10-CM | POA: Diagnosis not present

## 2023-12-24 DIAGNOSIS — Z1231 Encounter for screening mammogram for malignant neoplasm of breast: Secondary | ICD-10-CM

## 2023-12-24 LAB — LIPID PANEL

## 2023-12-24 LAB — BAYER DCA HB A1C WAIVED: HB A1C (BAYER DCA - WAIVED): 5.9 % — ABNORMAL HIGH (ref 4.8–5.6)

## 2023-12-24 MED ORDER — DEBROX 6.5 % OT SOLN: 5.0000 [drp] | Freq: Two times a day (BID) | OTIC | 0 refills | Status: AC

## 2023-12-24 NOTE — Progress Notes (Signed)
 Complete physical exam  Patient: Caitlyn Newman   DOB: November 25, 1950   73 y.o. Female  MRN: [id number]  Subjective:    Chief Complaint  Patient presents with   Annual Exam    Caitlyn Newman is a 73 y.o. female who presents today for a complete physical exam. She reports consuming a low sodium diet. The patient does not participate in regular exercise at present. She generally feels well. She reports sleeping well. She does not have additional problems to discuss today.   Caitlyn Newman is a 73 year old female who presents for an annual physical exam.  She reported that her cardiologist said everything was good regarding her heart failure and mentioned she might need a sleep study in the future. She experiences swelling in her legs, although she initially denied it.  She is no longer taking methotrexate for her rheumatoid arthritis and is scheduled to see her rheumatologist in October. She is scheduled to see her rheumatologist in October.  She continues to take iron supplements for anemia with no changes in stool color. She also takes vitamin D  at a dose of 1000 units daily.  She is on Cardizem  for proximal atrial flutter. She denied feeling excessively sleepy and did not report breakthrough heart racing. She occasionally sleeps in a chair but does not feel excessively sleepy.  Her A1c remains in the prediabetic range at 5.9. No changes in bowel habits, hair, skin, or nails. She has not had a mammogram this year but plans to have one done. She has not visited an eye doctor recently and finds it difficult to schedule an appointment.  She experiences occasional tinnitus and has not been using ear drops as recommended.      Most recent fall risk assessment:    12/24/2023   11:18 AM  Fall Risk   Falls in the past year? 0  Risk for fall due to : No Fall Risks  Follow up Falls evaluation completed     Most recent depression screenings:    12/24/2023   11:18 AM 12/19/2023    11:32 AM  PHQ 2/9 Scores  PHQ - 2 Score 0 0  PHQ- 9 Score 0     Vision:Not within last year  and Dental: No current dental problems  Patient Active Problem List   Diagnosis Date Noted   History of pericarditis 06/18/2023   Prediabetes 09/18/2022   Vitamin D  deficiency 09/18/2022   Atrial flutter, paroxysmal (HCC) 07/20/2022   Adrenal nodule (HCC) 06/14/2022   Liver nodule 06/14/2022   Iron deficiency anemia due to chronic blood loss 06/14/2022   Rheumatoid arthritis involving multiple sites with positive rheumatoid factor (HCC) 06/14/2022   Class 1 obesity 05/18/2022   S/P right TKA 11/24/2013   Past Medical History:  Diagnosis Date   Anemia    Arthritis    Gout    Pneumonia    as a baby   Wears glasses    Past Surgical History:  Procedure Laterality Date   COLONOSCOPY W/ POLYPECTOMY     IR THORACENTESIS ASP PLEURAL SPACE W/IMG GUIDE  05/22/2022   LYMPH NODE BIOPSY Right 01/13/2013   Procedure: LYMPH NODE BIOPSY RIGHT GROIN;  Surgeon: Debby A. Cornett, MD;  [facility];  Service: General;  Laterality: Right;   NO PAST SURGERIES     TOTAL KNEE ARTHROPLASTY Right 11/24/2013   Procedure: RIGHT TOTAL KNEE ARTHROPLASTY;  Surgeon: Donnice JONETTA Car, MD;  Location: [facility];  Service: Orthopedics;  Laterality:  Right;   Social History   Tobacco Use   Smoking status: Never   Smokeless tobacco: Never  Vaping Use   Vaping status: Never Used  Substance Use Topics   Alcohol use: No   Drug use: No   Social History   Socioeconomic History   Marital status: Single    Spouse name: Not on file   Number of children: Not on file   Years of education: Not on file   Highest education level: Not on file  Occupational History   Not on file  Tobacco Use   Smoking status: Never   Smokeless tobacco: Never  Vaping Use   Vaping status: Never Used  Substance and Sexual Activity   Alcohol use: No   Drug use: No   Sexual activity: Not Currently  Other Topics  Concern   Not on file  Social History Narrative   Not on file   Social Drivers of Health   Financial Resource Strain: Low Risk  (12/19/2023)   Overall Financial Resource Strain (CARDIA)    Difficulty of Paying Living Expenses: Not hard at all  Food Insecurity: No Food Insecurity (12/19/2023)   Hunger Vital Sign    Worried About Running Out of Food in the Last Year: Never true    Ran Out of Food in the Last Year: Never true  Transportation Needs: No Transportation Needs (12/19/2023)   PRAPARE - Administrator, Civil Service (Medical): No    Lack of Transportation (Non-Medical): No  Physical Activity: Inactive (12/19/2023)   Exercise Vital Sign    Days of Exercise per Week: 0 days    Minutes of Exercise per Session: 0 min  Stress: No Stress Concern Present (12/19/2023)   [employer] of Occupational Health - Occupational Stress Questionnaire    Feeling of Stress: Not at all  Social Connections: Socially Isolated (12/19/2023)   Social Connection and Isolation Panel    Frequency of Communication with Friends and Family: Once a week    Frequency of Social Gatherings with Friends and Family: Once a week    Attends Religious Services: Never    Database administrator or Organizations: No    Attends Banker Meetings: Never    Marital Status: Never married  Intimate Partner Violence: Not At Risk (12/19/2023)   Humiliation, Afraid, Rape, and Kick questionnaire    Fear of Current or Ex-Partner: No    Emotionally Abused: No    Physically Abused: No    Sexually Abused: No   Family Status  Relation Name Status   Mother  Deceased   Father  Deceased   Sister 5 Alive   Pat Aunt  Deceased   MGM  Deceased   MGF  Deceased   PGM  Deceased   PGF  Deceased   Brother 8 Alive   Neg Hx  (Not Specified)  No partnership data on file   Family History  Problem Relation Age of Onset   Diabetes Mother    Kidney disease Mother    Hypertension Mother    Hypertension  Father    Diabetes Father    Diabetes Sister    Ovarian cancer Paternal Aunt    Diabetes Maternal Grandmother    Diabetes Maternal Grandfather    Bone cancer Paternal Grandfather    Diabetes Brother    Breast cancer Neg Hx    Allergies  Allergen Reactions   Penicillins     Other reaction(s): Other Unknown   Hydrocortisone Rash  and Dermatitis   Penicillin G Rash and Dermatitis      Patient Care Team: Severa Rock HERO, FNP as PCP - General (Family Medicine) Mallipeddi, Diannah SQUIBB, MD as PCP - Cardiology (Cardiology)   Outpatient Medications Prior to Visit  Medication Sig   azelastine  (ASTELIN ) 0.1 % nasal spray Place 1 spray into both nostrils 2 (two) times daily. Use in each nostril as directed   Cholecalciferol  (VITAMIN D ) 2000 UNITS CAPS Take 1 capsule by mouth daily.  (Patient taking differently: Take 1 capsule by mouth daily. 1000 mg daily)   diltiazem  (CARDIZEM  CD) 120 MG 24 hr capsule TAKE 1 CAPSULE BY MOUTH EVERY DAY   ferrous sulfate  325 (65 FE) MG tablet Take 1 tablet (325 mg total) by mouth 3 (three) times daily after meals.   fluticasone  (FLONASE ) 50 MCG/ACT nasal spray Place 2 sprays into both nostrils daily.   folic acid  (FOLVITE ) 1 MG tablet Take 1 mg by mouth daily.   levocetirizine (XYZAL ) 5 MG tablet TAKE 1 TABLET BY MOUTH EVERY DAY IN THE EVENING   methotrexate (RHEUMATREX) 2.5 MG tablet Take 10 mg by mouth 2 (two) times a week. Wednesdays and Thursdays   [DISCONTINUED] guaifenesin  (HUMIBID E) 400 MG TABS tablet Take 1 tablet (400 mg total) by mouth every 6 (six) hours as needed. (Patient not taking: Reported on 12/24/2023)   [DISCONTINUED] pantoprazole  (PROTONIX ) 40 MG tablet Take 1 tablet (40 mg total) by mouth daily. (Patient not taking: Reported on 12/19/2023)   No facility-administered medications prior to visit.    ROS per HPI      Objective:     BP (!) 125/58   Pulse 64   Temp (!) 97 F (36.1 C)   Ht 5' 4 (1.626 m)   Wt 191 lb 12.8 oz (87 kg)    SpO2 97%   BMI 32.92 kg/m  BP Readings from Last 3 Encounters:  12/24/23 (!) 125/58  12/19/23 (!) 128/58  11/21/23 (!) 128/58   Wt Readings from Last 3 Encounters:  12/24/23 191 lb 12.8 oz (87 kg)  12/19/23 195 lb (88.5 kg)  11/21/23 195 lb (88.5 kg)   SpO2 Readings from Last 3 Encounters:  12/24/23 97%  11/21/23 98%  10/30/23 96%      Physical Exam Vitals and nursing note reviewed.  Constitutional:      General: She is not in acute distress.    Appearance: Normal appearance. She is obese. She is not ill-appearing, toxic-appearing or diaphoretic.  HENT:     Head: Normocephalic and atraumatic.     Right Ear: Tympanic membrane, ear canal and external ear normal.     Left Ear: There is impacted cerumen.     Nose: Nose normal.     Mouth/Throat:     Mouth: Mucous membranes are moist.     Pharynx: Oropharynx is clear.  Eyes:     Conjunctiva/sclera: Conjunctivae normal.     Pupils: Pupils are equal, round, and reactive to light.  Cardiovascular:     Rate and Rhythm: Normal rate and regular rhythm.     Heart sounds: Normal heart sounds.  Pulmonary:     Effort: Pulmonary effort is normal.     Breath sounds: Normal breath sounds.  Abdominal:     General: Bowel sounds are normal.     Palpations: Abdomen is soft.  Musculoskeletal:     Cervical back: Neck supple.     Right lower leg: No edema.     Left lower leg:  No edema.  Skin:    General: Skin is warm and dry.     Capillary Refill: Capillary refill takes less than 2 seconds.  Neurological:     General: No focal deficit present.     Mental Status: She is alert and oriented to person, place, and time.     Gait: Gait abnormal (antalgic, using cane).  Psychiatric:        Mood and Affect: Mood normal.        Behavior: Behavior normal.        Thought Content: Thought content normal.        Judgment: Judgment normal.       Last CBC Lab Results  Component Value Date   WBC 7.4 06/18/2023   HGB 12.9 06/18/2023   HCT  38.4 06/18/2023   MCV 94 06/18/2023   MCH 31.5 06/18/2023   RDW 14.5 06/18/2023   PLT 149 (L) 06/18/2023   Last metabolic panel Lab Results  Component Value Date   GLUCOSE 107 (H) 06/18/2023   NA 140 06/18/2023   K 4.4 06/18/2023   CL 104 06/18/2023   CO2 26 06/18/2023   BUN 17 06/18/2023   CREATININE 0.73 06/18/2023   EGFR 88 06/18/2023   CALCIUM 8.9 06/18/2023   PHOS 3.3 05/23/2022   PROT 6.3 06/18/2023   ALBUMIN 3.8 (L) 06/18/2023   LABGLOB 2.5 06/18/2023   AGRATIO 1.3 06/14/2022   BILITOT 0.3 06/18/2023   ALKPHOS 83 06/18/2023   AST 19 06/18/2023   ALT 14 06/18/2023   ANIONGAP 8 05/23/2022   Last lipids Lab Results  Component Value Date   CHOL 180 12/18/2022   HDL 68 12/18/2022   LDLCALC 99 12/18/2022   TRIG 69 12/18/2022   CHOLHDL 2.6 12/18/2022   Last hemoglobin A1c Lab Results  Component Value Date   HGBA1C 5.8 (H) 06/18/2023   Last thyroid  functions Lab Results  Component Value Date   TSH 1.870 06/18/2023   T4TOTAL 8.2 06/18/2023   Last vitamin D  Lab Results  Component Value Date   VD25OH 35.1 06/18/2023   Last vitamin B12 and Folate Lab Results  Component Value Date   VITAMINB12 525 06/18/2023   FOLATE >20.0 06/18/2023        Assessment & Plan:    Routine Health Maintenance and Physical Exam  Immunization History  Administered Date(s) Administered   Moderna Sars-Covid-2 Vaccination 07/09/2019, 08/12/2019    Health Maintenance  Topic Date Due   Hepatitis C Screening  Never done   DEXA SCAN  Never done   COVID-19 Vaccine (3 - Moderna risk series) 01/09/2024 (Originally 09/09/2019)   Zoster Vaccines- Shingrix (1 of 2) 03/24/2024 (Originally 02/25/1970)   Colonoscopy  06/17/2024 (Originally 02/20/2023)   Influenza Vaccine  07/07/2024 (Originally 11/08/2023)   Pneumococcal Vaccine: 50+ Years (1 of 2 - PCV) 12/23/2024 (Originally [birth year]25/1970)   Mammogram  11/06/2024   Medicare Annual Wellness (AWV)  12/18/2024   HPV VACCINES  Aged Out    Meningococcal B Vaccine  Aged Out   DTaP/Tdap/Td  Discontinued    Discussed health benefits of physical activity, and encouraged her to engage in regular exercise appropriate for her age and condition.  Problem List Items Addressed This Visit       Cardiovascular and Mediastinum   Atrial flutter, paroxysmal (HCC) (Chronic)     Musculoskeletal and Integument   Rheumatoid arthritis involving multiple sites with positive rheumatoid factor (HCC)     Other   Iron deficiency anemia due to  chronic blood loss   Relevant Orders   Anemia Profile B   Prediabetes   Relevant Orders   Bayer DCA Hb A1c Waived   CMP14+EGFR   Thyroid  Panel With TSH   Vitamin D  deficiency   Relevant Orders   CMP14+EGFR   Vitamin D , 25-hydroxy   Other Visit Diagnoses       Annual physical exam    -  Primary   Relevant Orders   CMP14+EGFR   Lipid panel   Anemia Profile B   Hepatitis C antibody   DG [facility] DEXA   MM 3D SCREENING MAMMOGRAM BILATERAL BREAST     Encounter for hepatitis C screening test for low risk patient       Relevant Orders   Hepatitis C antibody     Encounter for screening mammogram for malignant neoplasm of breast       Relevant Orders   MM 3D SCREENING MAMMOGRAM BILATERAL BREAST     Encounter for osteoporosis screening in asymptomatic postmenopausal patient       Relevant Orders   DG [facility] DEXA     Impacted cerumen of left ear       Relevant Medications   carbamide peroxide (DEBROX) 6.5 % OTIC solution         Adult Wellness Visit Routine adult wellness visit with emphasis on regular screenings and health maintenance. - Order DEXA scan for osteoporosis screening. - Order mammogram for breast cancer screening at her preferred hospital. - Update lab work.  Swelling of legs Reports significant swelling in legs.  Proximal atrial flutter Well-managed on Cardizem  with no side effects such as sleepiness or breakthrough palpitations.  Rheumatoid arthritis Managed with  methotrexate. Awaiting next rheumatology visit in October.  Iron deficiency anemia, chronic Continues iron supplementation with no changes in stool color indicating gastrointestinal bleeding.  Prediabetes A1c remains at 5.9. Advised to monitor intake of simple carbohydrates and sugary foods.  Vitamin D  deficiency Maintained on 1000 units of vitamin D  daily with no changes in hair, skin, or nails.  Hearing loss with cerumen impaction, left ear Cerumen impaction in left ear with occasional tinnitus. Right ear is clear. - Prescribe Debrox for cerumen removal in left ear.       Return in about 6 months (around 06/22/2024) for chronic follow up.     Rosaline Bruns, FNP

## 2023-12-25 LAB — THYROID PANEL WITH TSH
Free Thyroxine Index: 2 (ref 1.2–4.9)
T3 Uptake Ratio: 24 % (ref 24–39)
T4, Total: 8.3 ug/dL (ref 4.5–12.0)
TSH: 2.27 u[IU]/mL (ref 0.450–4.500)

## 2023-12-25 LAB — CMP14+EGFR
ALT: 16 IU/L (ref 0–32)
AST: 18 IU/L (ref 0–40)
Albumin: 3.9 g/dL (ref 3.9–4.9)
Alkaline Phosphatase: 83 IU/L (ref 49–135)
BUN/Creatinine Ratio: 18 (ref 12–28)
BUN: 15 mg/dL (ref 8–27)
Bilirubin Total: 0.4 mg/dL (ref 0.0–1.2)
CO2: 27 mmol/L (ref 20–29)
Calcium: 8.7 mg/dL (ref 8.7–10.3)
Chloride: 103 mmol/L (ref 96–106)
Creatinine, Ser: 0.84 mg/dL (ref 0.57–1.00)
Globulin, Total: 2.6 g/dL (ref 1.5–4.5)
Glucose: 87 mg/dL (ref 70–99)
Potassium: 4.3 mmol/L (ref 3.5–5.2)
Sodium: 139 mmol/L (ref 134–144)
Total Protein: 6.5 g/dL (ref 6.0–8.5)
eGFR: 75 mL/min/1.73 (ref >59)

## 2023-12-25 LAB — LIPID PANEL
Cholesterol, Total: 164 mg/dL (ref 100–199)
HDL: 67 mg/dL (ref >39)
LDL CALC COMMENT:: 2.4 ratio (ref 0.0–4.4)
LDL Chol Calc (NIH): 83 mg/dL (ref 0–99)
Triglycerides: 71 mg/dL (ref 0–149)
VLDL Cholesterol Cal: 14 mg/dL (ref 5–40)

## 2023-12-25 LAB — ANEMIA PROFILE B
Basophils Absolute: 0.1 x10E3/uL (ref 0.0–0.2)
Basos: 1 %
EOS (ABSOLUTE): 0.1 x10E3/uL (ref 0.0–0.4)
Eos: 1 %
Ferritin: 655 ng/mL — AB (ref 15–150)
Folate: >20 ng/mL (ref >3.0)
Hematocrit: 39.9 % (ref 34.0–46.6)
Hemoglobin: 13.2 g/dL (ref 11.1–15.9)
Immature Grans (Abs): 0 x10E3/uL (ref 0.0–0.1)
Immature Granulocytes: 0 %
Iron Saturation: 20 % (ref 15–55)
Iron: 47 ug/dL (ref 27–139)
Lymphocytes Absolute: 1.8 x10E3/uL (ref 0.7–3.1)
Lymphs: 21 %
MCH: 31.9 pg (ref 26.6–33.0)
MCHC: 33.1 g/dL (ref 31.5–35.7)
MCV: 96 fL (ref 79–97)
Monocytes Absolute: 0.4 x10E3/uL (ref 0.1–0.9)
Monocytes: 4 %
Neutrophils Absolute: 6.2 x10E3/uL (ref 1.4–7.0)
Neutrophils: 73 %
Platelets: 162 x10E3/uL (ref 150–450)
RBC: 4.14 x10E6/uL (ref 3.77–5.28)
RDW: 14.8 % (ref 11.7–15.4)
Retic Ct Pct: 1 % (ref 0.6–2.6)
Total Iron Binding Capacity: 236 ug/dL — AB (ref 250–450)
UIBC: 189 ug/dL (ref 118–369)
Vitamin B-12: 501 pg/mL (ref 232–1245)
WBC: 8.5 x10E3/uL (ref 3.4–10.8)

## 2023-12-25 LAB — VITAMIN D 25 HYDROXY (VIT D DEFICIENCY, FRACTURES): Vit D, 25-Hydroxy: 32.2 ng/mL (ref 30.0–100.0)

## 2023-12-25 LAB — HEPATITIS C ANTIBODY: Hep C Virus Ab: NONREACTIVE

## 2024-01-06 DIAGNOSIS — M0609 Rheumatoid arthritis without rheumatoid factor, multiple sites: Secondary | ICD-10-CM | POA: Diagnosis not present

## 2024-01-06 DIAGNOSIS — M1990 Unspecified osteoarthritis, unspecified site: Secondary | ICD-10-CM | POA: Diagnosis not present

## 2024-01-06 DIAGNOSIS — Z79899 Other long term (current) drug therapy: Secondary | ICD-10-CM | POA: Diagnosis not present

## 2024-02-11 DIAGNOSIS — R7303 Prediabetes: Secondary | ICD-10-CM | POA: Diagnosis not present

## 2024-02-11 DIAGNOSIS — I10 Essential (primary) hypertension: Secondary | ICD-10-CM | POA: Diagnosis not present

## 2024-02-11 DIAGNOSIS — K219 Gastro-esophageal reflux disease without esophagitis: Secondary | ICD-10-CM | POA: Diagnosis not present

## 2024-02-11 DIAGNOSIS — Z9989 Dependence on other enabling machines and devices: Secondary | ICD-10-CM | POA: Diagnosis not present

## 2024-02-11 DIAGNOSIS — G4730 Sleep apnea, unspecified: Secondary | ICD-10-CM | POA: Diagnosis not present

## 2024-02-11 DIAGNOSIS — D529 Folate deficiency anemia, unspecified: Secondary | ICD-10-CM | POA: Diagnosis not present

## 2024-02-11 DIAGNOSIS — M1990 Unspecified osteoarthritis, unspecified site: Secondary | ICD-10-CM | POA: Diagnosis not present

## 2024-02-11 DIAGNOSIS — Z833 Family history of diabetes mellitus: Secondary | ICD-10-CM | POA: Diagnosis not present

## 2024-02-11 DIAGNOSIS — M109 Gout, unspecified: Secondary | ICD-10-CM | POA: Diagnosis not present

## 2024-02-11 DIAGNOSIS — Z8249 Family history of ischemic heart disease and other diseases of the circulatory system: Secondary | ICD-10-CM | POA: Diagnosis not present

## 2024-02-11 DIAGNOSIS — M069 Rheumatoid arthritis, unspecified: Secondary | ICD-10-CM | POA: Diagnosis not present

## 2024-02-11 DIAGNOSIS — Z6832 Body mass index (BMI) 32.0-32.9, adult: Secondary | ICD-10-CM | POA: Diagnosis not present

## 2024-02-11 DIAGNOSIS — J301 Allergic rhinitis due to pollen: Secondary | ICD-10-CM | POA: Diagnosis not present

## 2024-02-11 DIAGNOSIS — Z79631 Long term (current) use of antimetabolite agent: Secondary | ICD-10-CM | POA: Diagnosis not present

## 2024-02-11 DIAGNOSIS — Z809 Family history of malignant neoplasm, unspecified: Secondary | ICD-10-CM | POA: Diagnosis not present

## 2024-02-11 LAB — MICROALBUMIN / CREATININE URINE RATIO: Microalb Creat Ratio: NORMAL

## 2024-03-20 ENCOUNTER — Other Ambulatory Visit: Payer: Self-pay | Admitting: Family Medicine

## 2024-03-20 DIAGNOSIS — R051 Acute cough: Secondary | ICD-10-CM

## 2024-03-20 DIAGNOSIS — J069 Acute upper respiratory infection, unspecified: Secondary | ICD-10-CM

## 2024-04-24 ENCOUNTER — Other Ambulatory Visit: Payer: Self-pay

## 2024-04-24 MED ORDER — DILTIAZEM HCL ER COATED BEADS 120 MG PO CP24: 120.0000 mg | ORAL_CAPSULE | Freq: Every day | ORAL | 3 refills | Status: AC

## 2024-06-23 ENCOUNTER — Ambulatory Visit: Payer: Self-pay | Admitting: Family Medicine

## 2024-12-21 ENCOUNTER — Ambulatory Visit: Payer: Self-pay

## 2024-12-25 ENCOUNTER — Encounter: Payer: Self-pay | Admitting: Family Medicine
